# Patient Record
Sex: Female | Born: 1965 | Race: White | Hispanic: No | Marital: Married | Smoking: Never smoker
Health system: Southern US, Community
[De-identification: ages and names within clinical notes are randomized; demographics above are authoritative.]

## PROBLEM LIST (undated history)

## (undated) DIAGNOSIS — E079 Disorder of thyroid, unspecified: Secondary | ICD-10-CM

## (undated) DIAGNOSIS — F419 Anxiety disorder, unspecified: Secondary | ICD-10-CM

## (undated) DIAGNOSIS — N950 Postmenopausal bleeding: Secondary | ICD-10-CM

## (undated) DIAGNOSIS — T1491XA Suicide attempt, initial encounter: Secondary | ICD-10-CM

## (undated) DIAGNOSIS — R32 Unspecified urinary incontinence: Secondary | ICD-10-CM

## (undated) DIAGNOSIS — K219 Gastro-esophageal reflux disease without esophagitis: Secondary | ICD-10-CM

## (undated) DIAGNOSIS — Z87898 Personal history of other specified conditions: Secondary | ICD-10-CM

## (undated) DIAGNOSIS — E785 Hyperlipidemia, unspecified: Secondary | ICD-10-CM

## (undated) DIAGNOSIS — E039 Hypothyroidism, unspecified: Secondary | ICD-10-CM

## (undated) HISTORY — DX: Unspecified urinary incontinence: R32

## (undated) HISTORY — DX: Anxiety disorder, unspecified: F41.9

## (undated) HISTORY — DX: Hypothyroidism, unspecified: E03.9

## (undated) HISTORY — DX: Hyperlipidemia, unspecified: E78.5

## (undated) HISTORY — DX: Disorder of thyroid, unspecified: E07.9

---

## 1998-04-05 ENCOUNTER — Other Ambulatory Visit: Admission: RE | Admit: 1998-04-05 | Discharge: 1998-04-05 | Payer: Self-pay | Admitting: Obstetrics & Gynecology

## 1998-07-27 HISTORY — PX: OTHER SURGICAL HISTORY: SHX169

## 1998-11-01 ENCOUNTER — Inpatient Hospital Stay (HOSPITAL_COMMUNITY): Admission: AD | Admit: 1998-11-01 | Discharge: 1998-11-04 | Payer: Self-pay | Admitting: *Deleted

## 1999-03-20 ENCOUNTER — Ambulatory Visit (HOSPITAL_COMMUNITY): Admission: RE | Admit: 1999-03-20 | Discharge: 1999-03-20 | Payer: Self-pay | Admitting: Obstetrics and Gynecology

## 1999-07-28 HISTORY — PX: OTHER SURGICAL HISTORY: SHX169

## 2004-12-17 ENCOUNTER — Encounter: Admission: RE | Admit: 2004-12-17 | Discharge: 2004-12-17 | Payer: Self-pay | Admitting: Family Medicine

## 2006-04-06 IMAGING — US US ABDOMEN COMPLETE
1 series · 14 of 25 positions shown · non-contrast
Comparison: none

CLINICAL DATA: Right upper quadrant abdominal pain.

[Series 1: unknown · 14 of 67 slices shown]
[im 1/67]
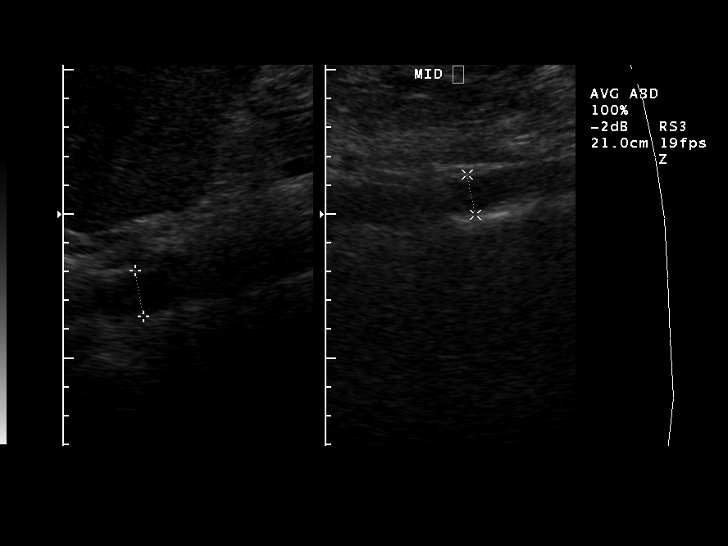
[im 6/67]
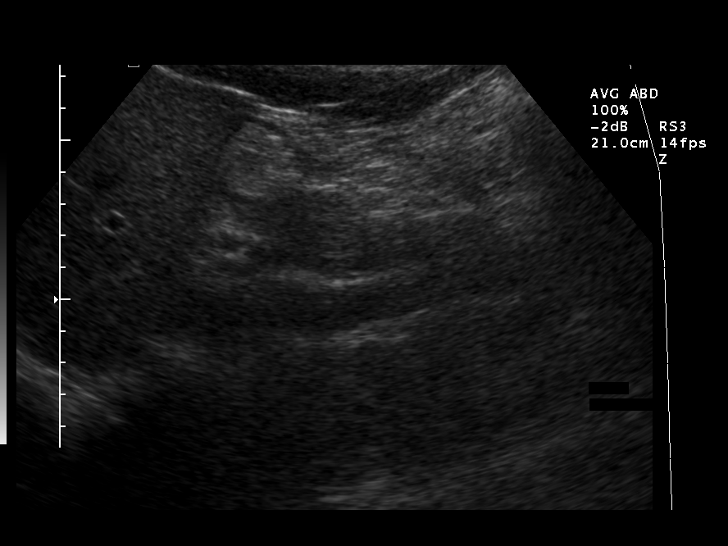
[im 12/67]
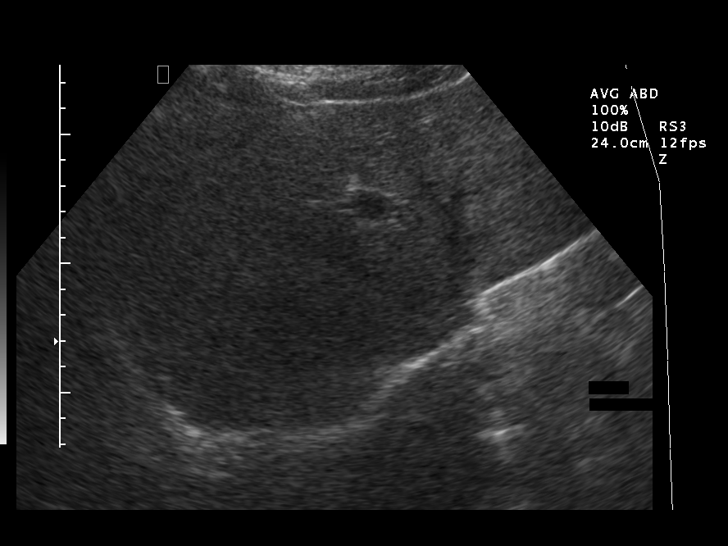
[im 17/67]
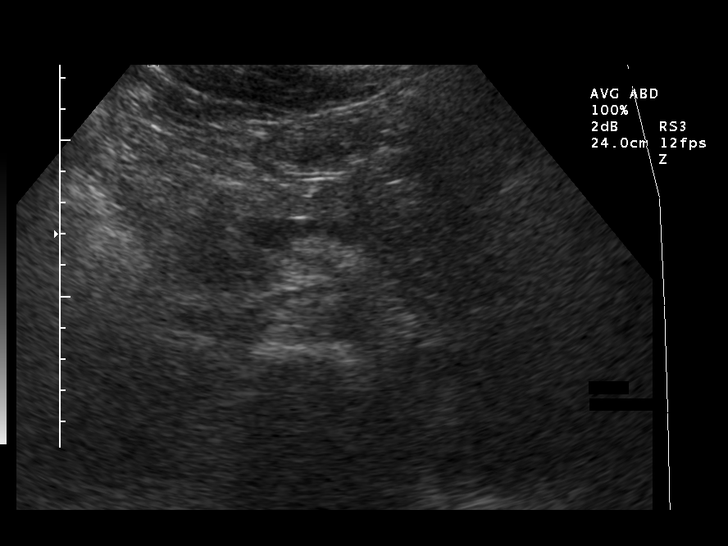
[im 23/67]
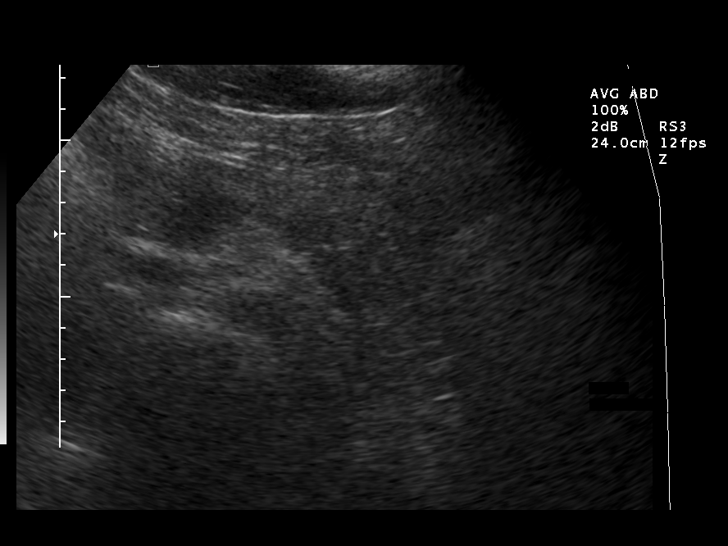
[im 25/67]
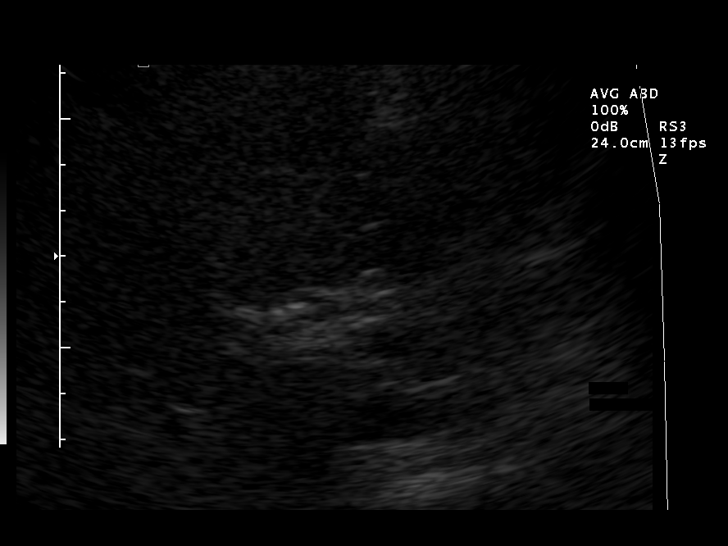
[im 31/67]
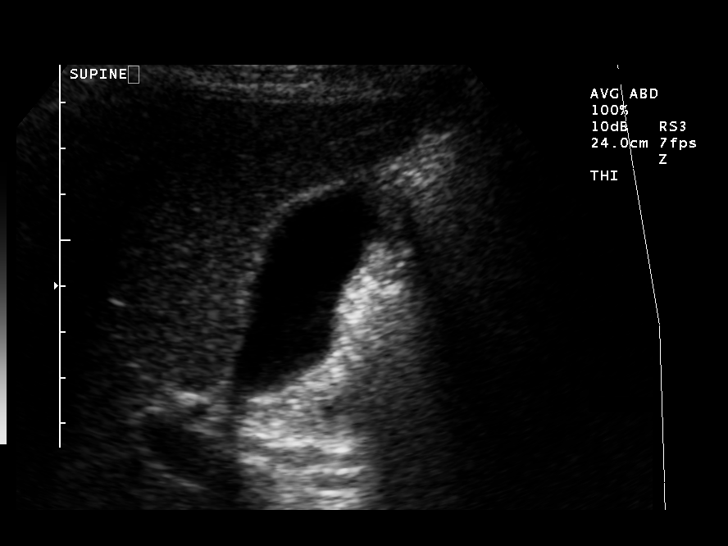
[im 36/67]
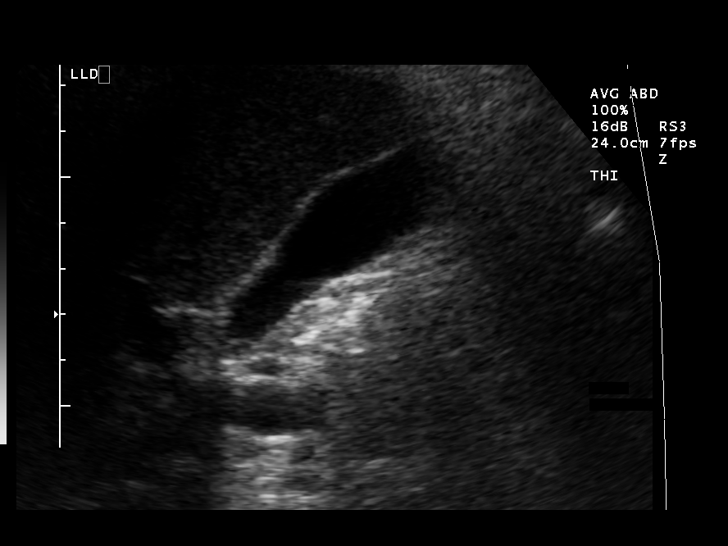
[im 42/67]
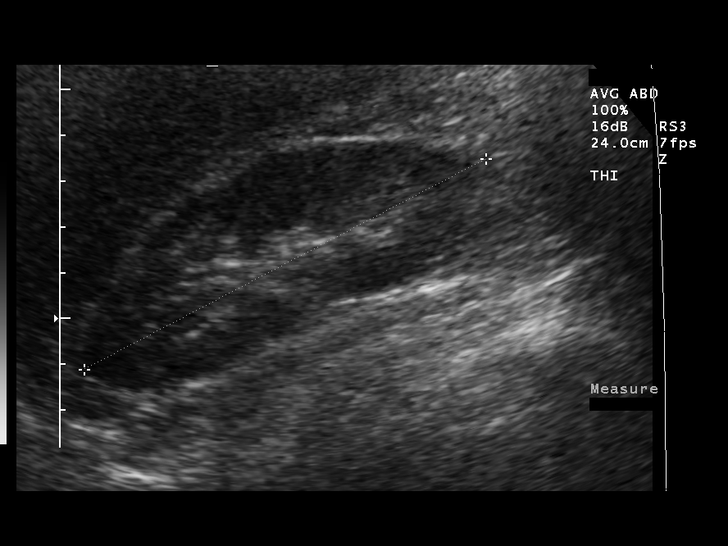
[im 45/67]
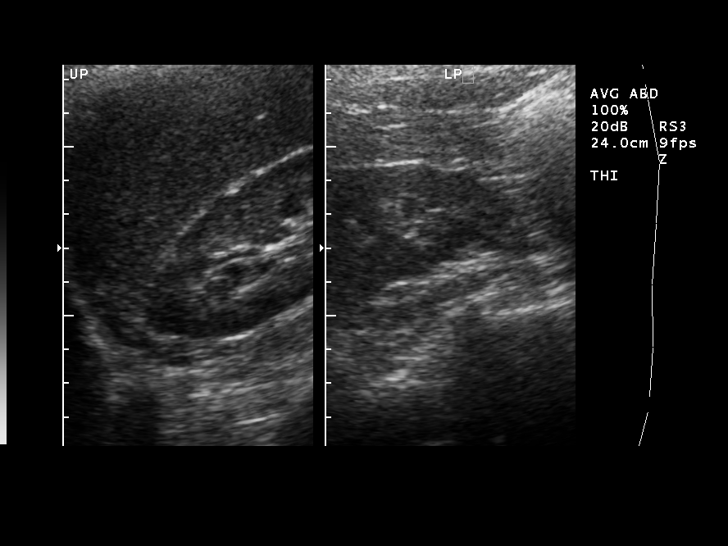
[im 50/67]
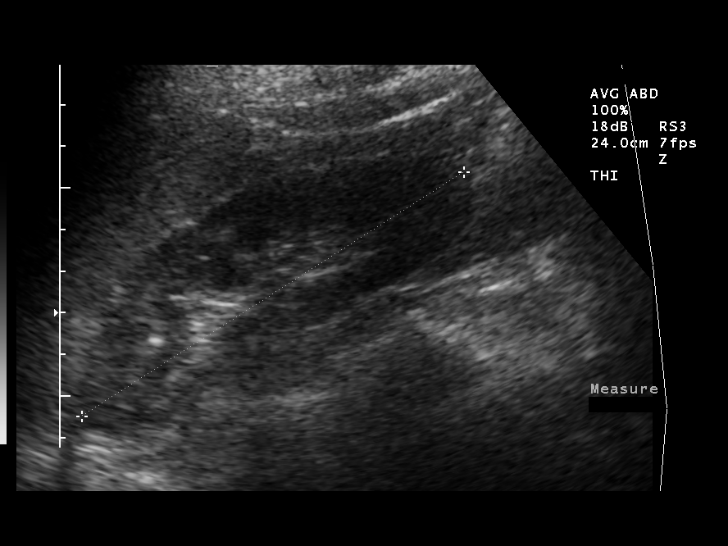
[im 56/67]
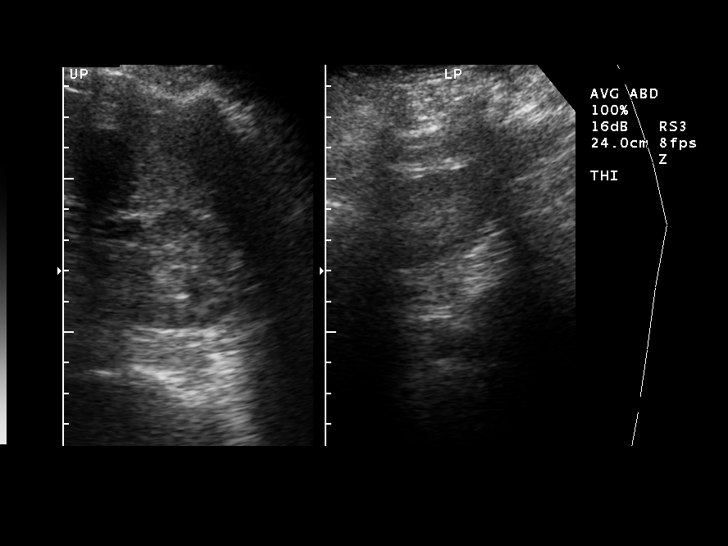
[im 61/67]
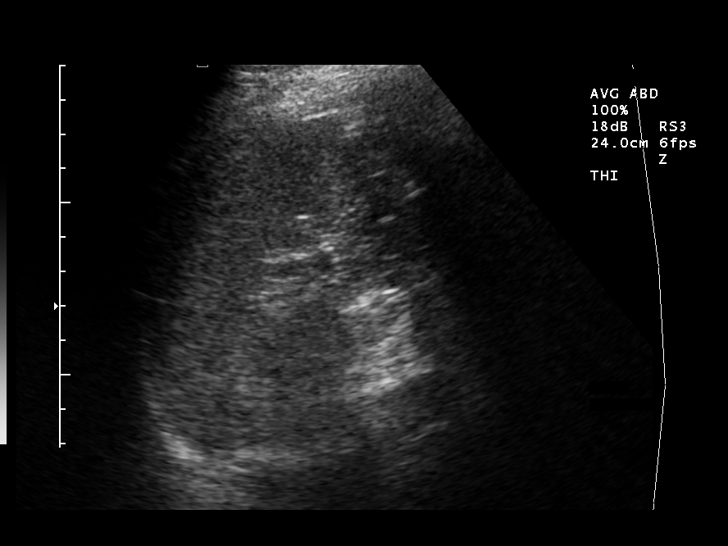
[im 67/67]
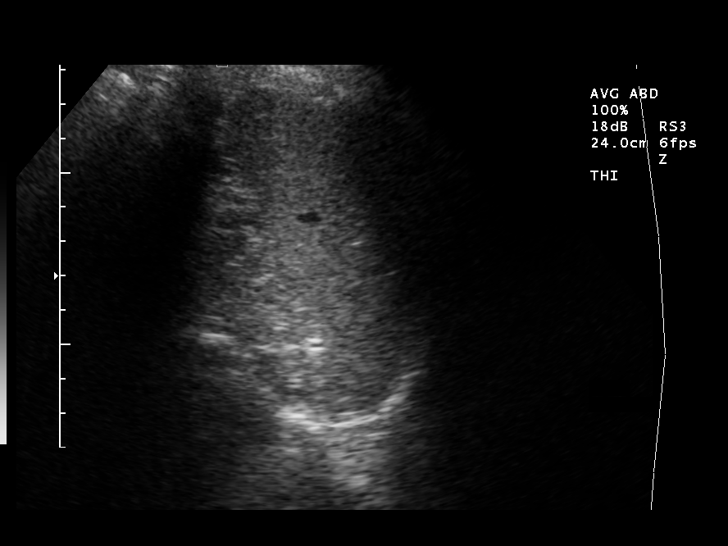

[14 of 25 positions shown; findings below may reference images not displayed]

COMPLETE ABDOMEN ULTRASOUND

The gallbladder, liver, spleen, kidneys, abdominal aorta and inferior vena cava
have normal appearances. Suboptimally visualized pancreas with no gross
pancreatic abnormality seen. No gallstones, biliary ductal dilatation or free
peritoneal fluid.  The common duct measures 3.9 mm in maximum diameter.

IMPRESSION

Suboptimally visualized pancreas. Otherwise, normal examination.

## 2007-06-02 ENCOUNTER — Other Ambulatory Visit: Admission: RE | Admit: 2007-06-02 | Discharge: 2007-06-02 | Payer: Self-pay | Admitting: Obstetrics & Gynecology

## 2013-05-02 ENCOUNTER — Other Ambulatory Visit: Payer: Self-pay | Admitting: Family Medicine

## 2013-05-02 DIAGNOSIS — Z1231 Encounter for screening mammogram for malignant neoplasm of breast: Secondary | ICD-10-CM

## 2013-05-09 ENCOUNTER — Ambulatory Visit: Payer: Self-pay

## 2013-06-07 ENCOUNTER — Other Ambulatory Visit: Payer: Self-pay | Admitting: Family Medicine

## 2013-06-07 ENCOUNTER — Ambulatory Visit
Admission: RE | Admit: 2013-06-07 | Discharge: 2013-06-07 | Disposition: A | Discharge status: Home / Self Care | Payer: Self-pay | Source: Ambulatory Visit | Attending: Family Medicine | Admitting: Family Medicine

## 2013-06-07 ENCOUNTER — Ambulatory Visit: Payer: Self-pay

## 2013-06-07 DIAGNOSIS — Z1231 Encounter for screening mammogram for malignant neoplasm of breast: Secondary | ICD-10-CM

## 2014-05-10 ENCOUNTER — Other Ambulatory Visit: Payer: Self-pay

## 2014-05-10 DIAGNOSIS — Z1239 Encounter for other screening for malignant neoplasm of breast: Secondary | ICD-10-CM

## 2014-06-08 ENCOUNTER — Encounter (INDEPENDENT_AMBULATORY_CARE_PROVIDER_SITE_OTHER): Payer: Self-pay

## 2014-06-08 ENCOUNTER — Ambulatory Visit: Payer: BC Managed Care – PPO

## 2014-06-08 ENCOUNTER — Ambulatory Visit
Admission: RE | Admit: 2014-06-08 | Discharge: 2014-06-08 | Disposition: A | Discharge status: Home / Self Care | Payer: 59 | Source: Ambulatory Visit

## 2014-06-08 DIAGNOSIS — Z1239 Encounter for other screening for malignant neoplasm of breast: Secondary | ICD-10-CM

## 2015-05-16 ENCOUNTER — Other Ambulatory Visit: Payer: Self-pay

## 2015-05-16 DIAGNOSIS — Z1231 Encounter for screening mammogram for malignant neoplasm of breast: Secondary | ICD-10-CM

## 2015-06-19 ENCOUNTER — Ambulatory Visit
Admission: RE | Admit: 2015-06-19 | Discharge: 2015-06-19 | Disposition: A | Discharge status: Home / Self Care | Payer: 59 | Source: Ambulatory Visit

## 2015-06-19 DIAGNOSIS — Z1231 Encounter for screening mammogram for malignant neoplasm of breast: Secondary | ICD-10-CM

## 2015-12-19 ENCOUNTER — Encounter: Payer: Self-pay | Admitting: Obstetrics and Gynecology

## 2015-12-19 ENCOUNTER — Ambulatory Visit (INDEPENDENT_AMBULATORY_CARE_PROVIDER_SITE_OTHER): Payer: BLUE CROSS/BLUE SHIELD | Admitting: Obstetrics and Gynecology

## 2015-12-19 VITALS — BP 122/82 | HR 84 | Resp 12 | Ht 64.5 in

## 2015-12-19 DIAGNOSIS — N750 Cyst of Bartholin's gland: Secondary | ICD-10-CM

## 2015-12-19 NOTE — Patient Instructions (Signed)
Bartholin Cyst or Abscess A Bartholin cyst is a fluid-filled sac that forms on a Bartholin gland. Bartholin glands are small glands that are located within the folds of skin (labia) along the sides of the lower opening of the vagina. These glands produce a fluid to moisten the outside of the vagina during sexual intercourse. A Bartholin cyst causes a bulge on the side of the vagina. A cyst that is not large or infected may not cause symptoms or problems. However, if the fluid within the cyst becomes infected, the cyst can turn into an abscess. An abscess may cause discomfort or pain. CAUSES A Bartholin cyst may develop when the duct of the gland becomes blocked. In many cases, the cause of this is not known. Various kinds of bacteria can cause the cyst to become infected and develop into an abscess. RISK FACTORS You may be at an increased risk of developing a Bartholin cyst or abscess if:  You are a woman of reproductive age.  You have a history of previous Bartholin cysts or abscesses.  You have diabetes.  You have a sexually transmitted disease (STD). SIGNS AND SYMPTOMS The severity of symptoms varies depending on the size of the cyst and whether it is infected. Symptoms may include:  A bulge or swelling near the lower opening of your vagina.  Discomfort or pain.  Redness.  Pain during sexual intercourse.  Pain when walking.  Fluid draining from the area. DIAGNOSIS Your health care provider may make a diagnosis based on your symptoms and a physical exam. He or she will look for swelling in your vaginal area. Blood tests may be done to check for infections. A sample of fluid from the cyst or abscess may also be taken to be tested in a lab. TREATMENT Small cysts that are not infected may not require any treatment. These often go away on their own. Yourhealth care provider will recommend hot baths and the use of warm compresses. These may also be part of the treatment for an abscess.  Treatment options for a large cyst or abscess may include:   Antibiotic medicine.  A surgical procedure to drain the abscess. One of the following procedures may be done:  Incision and drainage. An incision is made in the cyst or abscess so that the fluid drains out. A catheter may be placed inside the cyst so that it does not close and fill up with fluid again. The catheter will be removed after you have a follow-up visit with a specialist (gynecologist).  Marsupialization. The cyst or abscess is opened and kept open by stitching the edges of the skin to the walls of the cyst or abscess. This allows it to continue to drain and not fill up with fluid again. If you have cysts or abscesses that keep returning and have required incision and drainage multiple times, your health care provider may talk to you about surgery to remove the Bartholin gland. HOME CARE INSTRUCTIONS  Take medicines only as directed by your health care provider.  If you were prescribed an antibiotic medicine, finish it all even if you start to feel better.  Apply warm, wet compresses to the area or take warm, shallow baths that cover your pelvic region (sitz baths) several times a day or as directed by your health care provider.  Do not squeeze the cyst or apply heavy pressure to it.  Do not have sexual intercourse until the cyst has gone away.  If your cyst or abscess was   opened, a small piece of gauze or a drain may have been placed in the area to allow drainage. Do not remove the gauze or the drain until directed by your health care provider.  Wear feminine pads--not tampons--as needed for any drainage or bleeding.  Keep all follow-up visits as directed by your health care provider. This is important. PREVENTION Take these steps to help prevent a Bartholin cyst from returning:  Practice good hygiene.   Clean your vaginal area with mild soap and a soft cloth when you bathe.  Practice safe sex to prevent  STDs. SEEK MEDICAL CARE IF:  You have increased pain, swelling, or redness in the area of the cyst.  Puslike drainage is coming from the cyst.  You have a fever.   This information is not intended to replace advice given to you by your health care provider. Make sure you discuss any questions you have with your health care provider.   Document Released: 07/13/2005 Document Revised: 08/03/2014 Document Reviewed: 02/26/2014 Elsevier Interactive Patient Education 2016 [employer]  

## 2015-12-19 NOTE — Progress Notes (Signed)
Patient ID: Chelsea Gross, female   DOB: [birth year], 50 y.o.   MRN: [id number] 50 y.o. G1P1001 SingleCaucasianF here c/o a bartholin cyst. She has had a bartholin's cyst on the right drained x 2. The first time was in 2000 the second time in 2006. She noticed this cyst a couple of months ago, getting bigger. No baseline pain, just intermittent sharp pains.   Period Duration (Days): 2 days  Period Pattern: Regular Menstrual Flow: Heavy Menstrual Control: Thin pad, Tampon Dysmenorrhea: (!) Moderate Dysmenorrhea Symptoms: Headache  Patient's last menstrual period was 12/05/2015.          Sexually active: Yes.    The current method of family planning is none.    Exercising: No.  The patient does not participate in regular exercise at present. Smoker:  no  Health Maintenance: Pap:  06/2014 WNL per patient  History of abnormal Pap:  no MMG:  06-19-15 WNL Colonoscopy:  Never BMD:   Never TDaP:  2006 Gardasil: N/A   reports that she has never smoked. She has never used smokeless tobacco. She reports that she does not drink alcohol or use illicit drugs.  Past Medical History  Diagnosis Date  . Anxiety   . Thyroid disease   . Urinary incontinence   . Hyperlipemia     Past Surgical History  Procedure Laterality Date  . Labial repair  2000  . Chin and lip surgery  2001    Current Outpatient Prescriptions  Medication Sig Dispense Refill  . ALPRAZolam (XANAX) 0.25 MG tablet   1  . atorvastatin (LIPITOR) 40 MG tablet   3  . DULoxetine (CYMBALTA) 60 MG capsule   1  . levothyroxine (SYNTHROID, LEVOTHROID) 25 MCG tablet   1  . valACYclovir (VALTREX) 1000 MG tablet   0   No current facility-administered medications for this visit.    Family History  Problem Relation Age of Onset  . Heart attack Mother   . Esophageal cancer Mother   . Heart attack Father   . Hyperlipidemia Sister   . Hyperlipidemia Sister   . Hyperlipidemia Sister   . Hyperlipidemia Sister     Review of  Systems  Constitutional: Negative.   HENT: Negative.   Eyes: Negative.   Respiratory: Negative.   Cardiovascular: Negative.   Gastrointestinal: Negative.   Endocrine: Negative.   Genitourinary: Negative.        Bartholin cyst  Musculoskeletal: Negative.   Skin: Negative.   Allergic/Immunologic: Negative.   Neurological: Negative.   Psychiatric/Behavioral: Negative.     Exam:   BP 122/82 mmHg  Pulse 84  Resp 12  Ht 5' 4.5" (1.638 m)  Wt   LMP 12/05/2015  Weight change: @ Height:   Height: 5' 4.5" (163.8 cm)  Ht Readings from Last 3 Encounters:  12/19/15 5' 4.5" (1.638 m)    General appearance: alert, cooperative and appears stated age   Pelvic: External genitalia:  no lesions              Urethra:  normal appearing urethra with no masses, tenderness or lesions              Bartholins and Skenes: 1 cm, smooth, mobile right bartholin's cyst, not tender                   Chaperone was present for exam.  A:  50 year old menstruating female with recurrence of a right bartholin's cyst. She has been treated for this cyst 2  x in the past. The last x was 11 years ago. Not bothersome.   P:   I do not recommend word catheter placement at this time, the cyst is small and has already been treated x 2. We discussed marsupialization in the OR with biopsy of the cyst wall. I think the risk of malignancy is very low, particularly because she has had this 2 x in the past (starting 17 years ago). It is benign on exam. If this were her first time with a bartholin's I would be more concerned. She does not want marsupialization at this time.    Plan: return in 2 months for an annual exam and f/u exam, if the cyst is enlarging, would recommend biopsy and marsupialization

## 2015-12-25 ENCOUNTER — Telehealth: Payer: Self-pay | Admitting: Obstetrics and Gynecology

## 2015-12-25 NOTE — Telephone Encounter (Signed)
Patient canceled her 02/13/16 "f/u bartholin's cyst" appointment. Patient said "I no longer need this appointment".

## 2015-12-27 NOTE — Telephone Encounter (Signed)
Please call the patient and talk to her about f/u. She has a small right bartholin's cyst, she has had them treated before (many years ago). These cysts are less common at her age. If she were 19, I would be fine with her not following up, but because of her age the risks of pathology increases (still low). At the time of her visit she felt the cyst was getting bigger, if it continues to grow it would be important to biopsy the cyst. I would strongly encourage a f/u visit in 2-3 months.

## 2016-01-02 NOTE — Telephone Encounter (Signed)
Call to patient. Advised of omportance of follow-up as instructed by Dr Oscar LaJertson. Appointment scheduled for 01-30-16. Patient to call back if any changes noted before then.  Routing to provider for final review. Patient agreeable to disposition. Will close encounter.

## 2016-01-30 ENCOUNTER — Ambulatory Visit (INDEPENDENT_AMBULATORY_CARE_PROVIDER_SITE_OTHER): Payer: BLUE CROSS/BLUE SHIELD | Admitting: Obstetrics and Gynecology

## 2016-01-30 ENCOUNTER — Encounter: Payer: Self-pay | Admitting: Obstetrics and Gynecology

## 2016-01-30 VITALS — BP 114/80 | HR 78 | Resp 14 | Ht 64.5 in

## 2016-01-30 DIAGNOSIS — Z1211 Encounter for screening for malignant neoplasm of colon: Secondary | ICD-10-CM | POA: Diagnosis not present

## 2016-01-30 DIAGNOSIS — Z01419 Encounter for gynecological examination (general) (routine) without abnormal findings: Secondary | ICD-10-CM | POA: Diagnosis not present

## 2016-01-30 DIAGNOSIS — Z23 Encounter for immunization: Secondary | ICD-10-CM | POA: Diagnosis not present

## 2016-01-30 DIAGNOSIS — N750 Cyst of Bartholin's gland: Secondary | ICD-10-CM | POA: Diagnosis not present

## 2016-01-30 DIAGNOSIS — Z124 Encounter for screening for malignant neoplasm of cervix: Secondary | ICD-10-CM | POA: Diagnosis not present

## 2016-01-30 NOTE — Progress Notes (Addendum)
GYNECOLOGY  VISIT   HPI: 50 y.o.   Single  Caucasian  female   G1P1001 with Patient's last menstrual period was 01/11/2016.   here for recheck of bartholin cyst.     Menses q 3 weeks x 2.5 days. Saturates a regular tampon in 3-4 hours. No BTB. Slight cramps. Sexually active, same partner x 6 years, no contraception (for 6 years). No dyspareunia. No vaginal dryness. No vasomotor symptoms.  She states no change in her bartholin's cyst, not tender.   GYNECOLOGIC HISTORY: Patient's last menstrual period was 01/11/2016. 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         OB History    Gravida Para Term Preterm AB TAB SAB Ectopic Multiple Living   1 1 1       1          There are no active problems to display for this patient.   Past Medical History  Diagnosis Date  . Anxiety   . Thyroid disease   . Urinary incontinence   . Hyperlipemia   . Hypothyroid     Past Surgical History  Procedure Laterality Date  . Labial repair  2000  . Chin and lip surgery  2001    Current Outpatient Prescriptions  Medication Sig Dispense Refill  . ALPRAZolam (XANAX) 0.25 MG tablet   1  . atorvastatin (LIPITOR) 40 MG tablet   3  . DULoxetine (CYMBALTA) 60 MG capsule   1  . levothyroxine (SYNTHROID, LEVOTHROID) 25 MCG tablet   1  . valACYclovir (VALTREX) 1000 MG tablet   0   No current facility-administered medications for this visit.     ALLERGIES: Sulfa antibiotics and Pantoprazole  Family History  Problem Relation Age of Onset  . Heart attack Mother   . Esophageal cancer Mother   . Heart attack Father   . Hyperlipidemia Sister   . Hyperlipidemia Sister   . Hyperlipidemia Sister   . Hyperlipidemia Sister     Social History   Social History  .  Marital Status: Single    Spouse Name: N/A  . Number of Children: N/A  . Years of Education: N/A   Occupational History  . Not on file.   Social History Main Topics  . Smoking status: Never Smoker   . Smokeless tobacco: Never Used  . Alcohol Use: No  . Drug Use: No  . Sexual Activity:    Partners: Male   Other Topics Concern  . Not on file   Social History Narrative    Review of Systems  Constitutional: Negative.   HENT: Negative.   Eyes: Negative.   Respiratory: Negative.   Cardiovascular: Negative.   Gastrointestinal: Negative.   Genitourinary:       Bartholin Cyst  Musculoskeletal: Negative.   Skin: Negative.   Neurological: Negative.   Endo/Heme/Allergies: Negative.   Psychiatric/Behavioral: Negative.     PHYSICAL EXAMINATION:    BP 114/80 mmHg  Pulse 78  Resp 14  Ht 5' 4.5" (1.638 m)  Wt   LMP 01/11/2016    General appearance: alert, cooperative and appears stated age Neck: no adenopathy, supple, symmetrical, trachea midline and thyroid normal to inspection and palpation Heart: regular rate and rhythm Lungs: CTAB Abdomen: soft, non-tender; bowel sounds normal; no masses,  no  organomegaly Lungs: CTAB Extremities: normal, atraumatic, no cyanosis Skin: normal color, texture and turgor, no rashes or lesions Lymph: normal cervical supraclavicular and inguinal nodes Neurologic: grossly normal   Pelvic: External genitalia:  no lesions              Urethra:  normal appearing urethra with no masses, tenderness or lesions              Bartholins and Skenes: Stable 1 cm right bartholin's cyst. Smooth, not tender. No change.                Vagina: normal appearing vagina with normal color and discharge, no lesions              Cervix: no lesions              Bimanual Exam:  Uterus:  normal size, contour, position, consistency, mobility, non-tender and retroverted              Adnexa: no mass, fullness, tenderness              Rectovaginal: Yes.  .   Confirms.              Anus:  normal sphincter tone, no lesions  Chaperone was present for exam.  ASSESSMENT Well woman exam Bartholin's cyst, stable, h/o word catheter placement x 2 in the past    PLAN Pap with hpv Labs with primary TDAP today Discussed breast self awareness Discussed calcium and vit D Mammogram in 11/17 Colonoscopy due, she is hesitant, will give her IFOBT     An After Visit Summary was printed and given to the patient.   Addendum: the patient is aware to call if she notices any increased size of the bartholin's cyst

## 2016-02-03 LAB — IPS PAP TEST WITH HPV

## 2016-02-13 ENCOUNTER — Ambulatory Visit: Payer: BLUE CROSS/BLUE SHIELD | Admitting: Obstetrics and Gynecology

## 2016-04-16 DIAGNOSIS — F329 Major depressive disorder, single episode, unspecified: Secondary | ICD-10-CM | POA: Insufficient documentation

## 2016-04-16 DIAGNOSIS — E785 Hyperlipidemia, unspecified: Secondary | ICD-10-CM | POA: Insufficient documentation

## 2016-04-16 DIAGNOSIS — E039 Hypothyroidism, unspecified: Secondary | ICD-10-CM | POA: Insufficient documentation

## 2016-04-16 DIAGNOSIS — F32A Depression, unspecified: Secondary | ICD-10-CM | POA: Insufficient documentation

## 2016-04-16 DIAGNOSIS — F419 Anxiety disorder, unspecified: Secondary | ICD-10-CM | POA: Insufficient documentation

## 2016-04-16 HISTORY — DX: Hyperlipidemia, unspecified: E78.5

## 2016-04-16 HISTORY — DX: Major depressive disorder, single episode, unspecified: F32.9

## 2016-04-16 HISTORY — DX: Hypothyroidism, unspecified: E03.9

## 2016-04-16 HISTORY — DX: Depression, unspecified: F32.A

## 2016-06-30 ENCOUNTER — Other Ambulatory Visit: Payer: Self-pay | Admitting: Family Medicine

## 2016-06-30 DIAGNOSIS — Z1231 Encounter for screening mammogram for malignant neoplasm of breast: Secondary | ICD-10-CM

## 2016-07-27 HISTORY — PX: OTHER SURGICAL HISTORY: SHX169

## 2016-08-06 ENCOUNTER — Ambulatory Visit
Admission: RE | Admit: 2016-08-06 | Discharge: 2016-08-06 | Disposition: A | Discharge status: Home / Self Care | Payer: BLUE CROSS/BLUE SHIELD | Source: Ambulatory Visit | Attending: Family Medicine | Admitting: Family Medicine

## 2016-08-06 DIAGNOSIS — Z1231 Encounter for screening mammogram for malignant neoplasm of breast: Secondary | ICD-10-CM

## 2017-02-10 NOTE — Progress Notes (Signed)
51 y.o. G1P1001 SingleCaucasianF here for annual exam.   She has a right bartholin's cyst, not bothersome, not changing. She has a new skin tag on the right vulva, she doesn't like it and wants it removed.  Menses are mainly every month, one skipped cycle and this current cycle is 2 weeks late.  Sexually active, same partner x 1 year. No dyspareunia. No vasomotor symptoms, no vaginal dryness.  Work and family have been stressful. Some c/o palpitations, no SOB, no chest pain.  Period Duration (Days): 3 Period Pattern: (!) Irregular Menstrual Flow: Moderate Menstrual Control: Thin pad, Tampon Menstrual Control Change Freq (Hours): every 3-4  Dysmenorrhea: (!) Mild Dysmenorrhea Symptoms: Cramping, Headache  Patient's last menstrual period was 02/11/2017.          Sexually active: Yes.    The current method of family planning is none.    Exercising: No.  The patient does not participate in regular exercise at present. Smoker:  no  Health Maintenance: Pap:  01/30/16 Neg. HR HPV:neg  History of abnormal Pap:  no MMG:  08/06/16 BIRADS1:neg  Colonoscopy:  Never BMD:   Never TDaP:  2017   reports that she has never smoked. She has never used smokeless tobacco. She reports that she does not drink alcohol or use drugs. 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 Daughter is 22, going to college in the fall.   Past Medical History:  Diagnosis Date  . Anxiety   . Hyperlipemia   . Hypothyroid   . Thyroid disease   . Urinary incontinence     Past Surgical History:  Procedure Laterality Date  . chin and lip surgery  2001  . labial repair  2000    Current Outpatient Prescriptions  Medication Sig Dispense Refill  . ALPRAZolam (XANAX) 0.25 MG tablet Take 0.25 mg by mouth at bedtime as needed.   1  . atorvastatin (LIPITOR) 40 MG tablet Take 40 mg by mouth daily at 6 PM.   3  . DULoxetine (CYMBALTA) 60 MG capsule Take 60 mg by mouth daily.   1  . levothyroxine (SYNTHROID, LEVOTHROID) 25 MCG  tablet Take 25 mcg by mouth daily before breakfast.   1  . metroNIDAZOLE (METROGEL) 1 % gel Apply topically daily. For face    . omeprazole (PRILOSEC) 20 MG capsule daily as needed.  6  . triamcinolone lotion (KENALOG) 0.1 % ataa bid    . valACYclovir (VALTREX) 1000 MG tablet Take 1,000 mg by mouth as needed.   0   No current facility-administered medications for this visit.     Family History  Problem Relation Age of Onset  . Heart attack Mother   . Esophageal cancer Mother   . Heart attack Father   . Hyperlipidemia Sister   . Hyperlipidemia Sister   . Hyperlipidemia Sister   . Hyperlipidemia Sister     Review of Systems  Constitutional: Negative.   HENT: Negative.   Eyes: Negative.   Respiratory: Negative.   Cardiovascular: Negative.   Gastrointestinal: Negative.   Endocrine: Negative.   Genitourinary: Negative.   Musculoskeletal: Negative.   Skin: Negative.   Allergic/Immunologic: Negative.   Neurological: Negative.   Hematological: Negative.   Psychiatric/Behavioral: Negative.     Exam:   BP 110/86 (BP Location: Left Arm, Patient Position: Sitting, Cuff Size: Large)   Pulse 90   Resp 16   Ht 5' 4.25" (1.632 m)   LMP 02/11/2017   Weight change: @WEIGHTCHANGE @ Height:  Height: 5' 4.25" (163.2 cm)  Ht Readings from Last 3 Encounters:  02/11/17 5' 4.25" (1.632 m)  01/30/16 5' 4.5" (1.638 m)  12/19/15 5' 4.5" (1.638 m)    General appearance: alert, cooperative and appears stated age Head: Normocephalic, without obvious abnormality, atraumatic Neck: no adenopathy, supple, symmetrical, trachea midline and thyroid normal to inspection and palpation Lungs: clear to auscultation bilaterally Cardiovascular: regular rate and rhythm Breasts: normal appearance, no masses or tenderness Abdomen: soft, non-tender; bowel sounds normal; no masses,  no organomegaly Extremities: extremities normal, atraumatic, no cyanosis or edema Skin: Skin color, texture, turgor normal. No  rashes or lesions Lymph nodes: Cervical, supraclavicular, and axillary nodes normal. No abnormal inguinal nodes palpated Neurologic: Grossly normal   Pelvic: External genitalia:  4-5 mm skin tag at the end of the right labia minora              Urethra:  normal appearing urethra with no masses, tenderness or lesions              Bartholins and Skenes: stable, non tender, 1 cm right bartholin's cyst                 Vagina: normal appearing vagina with normal color and discharge, no lesions              Cervix: no lesions               Bimanual Exam:  Uterus:  normal size, contour, position, consistency, mobility, non-tender and retroverted              Adnexa: no mass, fullness, tenderness               Rectovaginal: Confirms               Anus:  normal sphincter tone, no lesions  Chaperone was present for exam.  A:  Well Woman with normal exam  Right bartholin's cyst, small and stable. Not symptomatic  Skin tag on the right labia minora, desires removal    P:   No pap today  Mammogram UTD  Labs with primary  Declines colonoscopy, IFOB testing given (discussed cologard)  Discussed breast self exam   Discussed calcium and vit D intake  Return for skin tag removal

## 2017-02-11 ENCOUNTER — Encounter: Payer: Self-pay | Admitting: Obstetrics and Gynecology

## 2017-02-11 ENCOUNTER — Ambulatory Visit (INDEPENDENT_AMBULATORY_CARE_PROVIDER_SITE_OTHER): Payer: BLUE CROSS/BLUE SHIELD | Admitting: Obstetrics and Gynecology

## 2017-02-11 VITALS — BP 110/86 | HR 90 | Resp 16 | Ht 64.25 in

## 2017-02-11 DIAGNOSIS — Z01419 Encounter for gynecological examination (general) (routine) without abnormal findings: Secondary | ICD-10-CM

## 2017-02-11 DIAGNOSIS — Z1211 Encounter for screening for malignant neoplasm of colon: Secondary | ICD-10-CM

## 2017-02-11 DIAGNOSIS — N9089 Other specified noninflammatory disorders of vulva and perineum: Secondary | ICD-10-CM

## 2017-02-11 DIAGNOSIS — N750 Cyst of Bartholin's gland: Secondary | ICD-10-CM

## 2017-02-11 NOTE — Patient Instructions (Signed)

## 2017-02-24 LAB — FECAL OCCULT BLOOD, IMMUNOCHEMICAL: IFOBT: POSITIVE

## 2017-02-24 NOTE — Addendum Note (Signed)
Addended by: Loreta AveMORALES, Elie Gragert C on: 02/24/2017 09:52 AM   Modules accepted: Orders

## 2017-02-25 ENCOUNTER — Telehealth: Payer: Self-pay | Admitting: *Deleted

## 2017-02-25 DIAGNOSIS — R195 Other fecal abnormalities: Secondary | ICD-10-CM

## 2017-02-25 NOTE — Telephone Encounter (Signed)
-----   Message from Romualdo BolkJill Evelyn Jertson, MD sent at 02/24/2017  5:01 PM EDT ----- Please tell the patient that her IFOB was + and set her up for a colonoscopy.

## 2017-02-25 NOTE — Telephone Encounter (Signed)
Left message to call Slevin Gunby at [phone].  

## 2017-03-02 NOTE — Telephone Encounter (Signed)
Spoke with Misty StanleyLisa at Dr. Kenna GilbertMann's office. Patient scheduled for 03/04/17 at 3pm with Dr. Loreta AveMann. Was advised to fax OV notes, labs, copy of referral and insurance information to [phone].   Requested information faxed to Dr. Loreta AveMann at [phone].

## 2017-03-02 NOTE — Telephone Encounter (Signed)
Spoke with patient, advised as seen below per Dr. Oscar LaJertson. Patient accepts referral to GI- Dr. Loreta AveMann. Advised patient referral placed, will call to schedule and return call with appointment details. Patient verbalizes understanding and is agreeable.

## 2017-03-02 NOTE — Telephone Encounter (Signed)
Spoke with patient, advised of appointment details as seen below, contact information provided. Patient verbalizes understanding and is agreeable.   Routing to provider for final review. Patient is agreeable to disposition. Will close encounter.

## 2017-03-04 ENCOUNTER — Telehealth: Payer: Self-pay | Admitting: Obstetrics and Gynecology

## 2017-03-04 ENCOUNTER — Other Ambulatory Visit: Payer: Self-pay | Admitting: Obstetrics and Gynecology

## 2017-03-04 NOTE — Telephone Encounter (Signed)
Opened phone message in error. °

## 2017-03-10 ENCOUNTER — Ambulatory Visit: Payer: BLUE CROSS/BLUE SHIELD | Admitting: Obstetrics and Gynecology

## 2017-03-11 ENCOUNTER — Ambulatory Visit: Payer: BLUE CROSS/BLUE SHIELD | Admitting: Obstetrics and Gynecology

## 2017-05-07 LAB — HM COLONOSCOPY

## 2017-08-02 ENCOUNTER — Other Ambulatory Visit: Payer: Self-pay | Admitting: Family Medicine

## 2017-08-02 DIAGNOSIS — Z1231 Encounter for screening mammogram for malignant neoplasm of breast: Secondary | ICD-10-CM

## 2017-08-19 ENCOUNTER — Ambulatory Visit: Payer: BLUE CROSS/BLUE SHIELD

## 2017-09-02 ENCOUNTER — Ambulatory Visit: Payer: BLUE CROSS/BLUE SHIELD

## 2017-09-15 ENCOUNTER — Telehealth: Payer: Self-pay | Admitting: Obstetrics and Gynecology

## 2017-09-15 NOTE — Telephone Encounter (Signed)
Spoke with patient. No menses in 4 months, spotting on 2/19. Patient states she is possibly menopausal, recent increase in mood swings.   Reports intermittent external vaginal burning/soreness started "2-4 months ago". Increased frequency, varies from small amount of urine to normal amounts.   Denies fever/chills, lower back pain, vaginal odor, or blood in urine.   Has taken AZO for the past 2 days with some relief. Instructed not to use Azo for more than 2 days, recommended OV for further evaluation. OV scheduled for 2/21 at 4pm with Dr. Oscar LaJertson. Advised patient Dr. Oscar LaJertson will review, I will return call with any additional recommendations, patient is agreeable.   Routing to provider for final review. Patient is agreeable to disposition. Will close encounter.

## 2017-09-15 NOTE — Telephone Encounter (Signed)
Patient having menopausal issues and would like to discuss medication.

## 2017-09-16 ENCOUNTER — Encounter: Payer: Self-pay | Admitting: Obstetrics and Gynecology

## 2017-09-16 ENCOUNTER — Other Ambulatory Visit: Payer: Self-pay

## 2017-09-16 ENCOUNTER — Ambulatory Visit: Payer: BLUE CROSS/BLUE SHIELD | Admitting: Obstetrics and Gynecology

## 2017-09-16 VITALS — BP 138/86 | HR 84 | Resp 16

## 2017-09-16 DIAGNOSIS — N951 Menopausal and female climacteric states: Secondary | ICD-10-CM

## 2017-09-16 DIAGNOSIS — R300 Dysuria: Secondary | ICD-10-CM | POA: Diagnosis not present

## 2017-09-16 DIAGNOSIS — N762 Acute vulvitis: Secondary | ICD-10-CM

## 2017-09-16 DIAGNOSIS — N912 Amenorrhea, unspecified: Secondary | ICD-10-CM | POA: Diagnosis not present

## 2017-09-16 DIAGNOSIS — R350 Frequency of micturition: Secondary | ICD-10-CM | POA: Diagnosis not present

## 2017-09-16 LAB — POCT URINE PREGNANCY: Preg Test, Ur: NEGATIVE

## 2017-09-16 MED ORDER — NITROFURANTOIN MONOHYD MACRO 100 MG PO CAPS: 100.0000 mg | ORAL_CAPSULE | Freq: Two times a day (BID) | ORAL | 0 refills | Status: DC

## 2017-09-16 MED ORDER — PHENAZOPYRIDINE HCL 200 MG PO TABS: 200.0000 mg | ORAL_TABLET | Freq: Three times a day (TID) | ORAL | 0 refills | Status: DC | PRN

## 2017-09-16 MED ORDER — MEDROXYPROGESTERONE ACETATE 5 MG PO TABS: ORAL_TABLET | ORAL | 1 refills | Status: DC

## 2017-09-16 MED ORDER — FLUCONAZOLE 150 MG PO TABS: 150.0000 mg | ORAL_TABLET | Freq: Once | ORAL | 0 refills | Status: AC

## 2017-09-16 NOTE — Progress Notes (Signed)
GYNECOLOGY  VISIT   HPI: 52 y.o.   Married  Caucasian  female   G1P1001 with Patient's last menstrual period was 03/27/2017 (approximate).   here c/o dysuria, frequency and RLQ pain    Her symptoms started 3 days ago, frequency, urgency and pain. Also has pain after voiding, feels like she has to void right after she voids, but doesn't. Azo has helped some.  She also c/o an "ovulation" type cramp in the RLQ started yesterday, intermittent. Mild discomfort. Spotting yesterday. More emotional in the last few days.  LMP 9/18, prior to that she was cycling monthly, occasionally a little off. Then last cycle around 9-10/18. She has felt premenstrual, but no bleeding.  Sexually active with her husband, together x 1.5 years, haven't used contraception.  She also has some vaginal burning and irritation. No vaginal dryness. No increase in vaginal d/c, no itching.  These symptoms of burning and urgency have come intermittently in the last few months. Only last a few days typically.  No fever, no flank pain.   No hot flashes, night sweats or vaginal dryness  GYNECOLOGIC HISTORY: Patient's last menstrual period was 03/27/2017 (approximate). Contraception:none  Menopausal hormone therapy: none         OB History    Gravida Para Term Preterm AB Living   1 1 1     1    SAB TAB Ectopic Multiple Live Births           1         There are no active problems to display for this patient.   Past Medical History:  Diagnosis Date  . Anxiety   . Hyperlipemia   . Hypothyroid   . Thyroid disease   . Urinary incontinence     Past Surgical History:  Procedure Laterality Date  . chin and lip surgery  2001  . labial repair  2000    Current Outpatient Medications  Medication Sig Dispense Refill  . ALPRAZolam (XANAX) 0.25 MG tablet Take 0.25 mg by mouth at bedtime as needed.   1  . atorvastatin (LIPITOR) 40 MG tablet Take 40 mg by mouth daily at 6 PM.   3  . DULoxetine (CYMBALTA) 60 MG capsule Take  60 mg by mouth daily.   1  . levothyroxine (SYNTHROID, LEVOTHROID) 25 MCG tablet Take 25 mcg by mouth daily before breakfast.   1  . metroNIDAZOLE (METROGEL) 1 % gel Apply topically daily. For face    . omeprazole (PRILOSEC) 20 MG capsule daily as needed.  6  . triamcinolone lotion (KENALOG) 0.1 % ataa bid    . valACYclovir (VALTREX) 1000 MG tablet Take 1,000 mg by mouth as needed.   0   No current facility-administered medications for this visit.      ALLERGIES: Sulfa antibiotics and Pantoprazole  Family History  Problem Relation Age of Onset  . Heart attack Mother   . Esophageal cancer Mother   . Heart attack Father   . Hyperlipidemia Sister   . Hyperlipidemia Sister   . Hyperlipidemia Sister   . Hyperlipidemia Sister     Social History   Socioeconomic History  . Marital status: Married    Spouse name: Not on file  . Number of children: Not on file  . Years of education: Not on file  . Highest education level: Not on file  Social Needs  . Financial resource strain: Not on file  . Food insecurity - worry: Not on file  . Food insecurity -  inability: Not on file  . Transportation needs - medical: Not on file  . Transportation needs - non-medical: Not on file  Occupational History  . Not on file  Tobacco Use  . Smoking status: Never Smoker  . Smokeless tobacco: Never Used  Substance and Sexual Activity  . Alcohol use: No    Alcohol/week: 0.0 oz  . Drug use: No  . Sexual activity: Yes    Partners: Male    Birth control/protection: None  Other Topics Concern  . Not on file  Social History Narrative  . Not on file    Review of Systems  Constitutional: Negative.   HENT: Negative.   Eyes: Negative.   Cardiovascular: Negative.   Gastrointestinal: Negative.   Genitourinary: Positive for dysuria and frequency.       Vaginal burning  RLQ pain   Musculoskeletal: Negative.   Skin: Negative.   Neurological: Negative.   Endo/Heme/Allergies: Negative.    Psychiatric/Behavioral: Negative.     PHYSICAL EXAMINATION:    BP 138/86 (BP Location: Left Arm, Patient Position: Sitting, Cuff Size: Normal)   Pulse 84   Resp 16   LMP 03/27/2017 (Approximate)     General appearance: alert, cooperative and appears stated age Neck: supple, no thyromegaly Abdomen: soft, mildly tender in the SP region; non distended, no masses,  no organomegaly  Pelvic: External genitalia:  no lesions              Urethra:  normal appearing urethra with no masses, tenderness or lesions              Bartholins and Skenes: normal                 Vagina: normal appearing vagina with normal color and discharge, no lesions              Cervix: no cervical motion tenderness and no lesions              Bimanual Exam:  Uterus:  normal size, contour, position, consistency, mobility, non-tender and retroverted              Adnexa: no mass, fullness, tenderness              Bladder: mildly tender  Chaperone was present for exam.  ASSESSMENT Dysuria, frequency, urgency, c/w UTI Vulvitis, normal exam Amenorrhea Perimenopause    PLAN Urine for ua, c&s Affirm Treat with macrobid and pyridium (patient requests a diflucan) UPT negative Check TSH and FSH Will treat with cyclic provera Use vaseline externally as needed   An After Visit Summary was printed and given to the patient.

## 2017-09-16 NOTE — Patient Instructions (Signed)
Menopause Menopause is the normal time of life when menstrual periods stop completely. Menopause is complete when you have missed 12 consecutive menstrual periods. It usually occurs between the ages of 48 years and 55 years. Very rarely does a woman develop menopause before the age of 40 years. At menopause, your ovaries stop producing the female hormones estrogen and progesterone. This can cause undesirable symptoms and also affect your health. Sometimes the symptoms may occur 4-5 years before the menopause begins. There is no relationship between menopause and:  Oral contraceptives.  Number of children you had.  Race.  The age your menstrual periods started (menarche).  Heavy smokers and very thin women may develop menopause earlier in life. What are the causes?  The ovaries stop producing the female hormones estrogen and progesterone. Other causes include:  Surgery to remove both ovaries.  The ovaries stop functioning for no known reason.  Tumors of the pituitary gland in the brain.  Medical disease that affects the ovaries and hormone production.  Radiation treatment to the abdomen or pelvis.  Chemotherapy that affects the ovaries.  What are the signs or symptoms?  Hot flashes.  Night sweats.  Decrease in sex drive.  Vaginal dryness and thinning of the vagina causing painful intercourse.  Dryness of the skin and developing wrinkles.  Headaches.  Tiredness.  Irritability.  Memory problems.  Weight gain.  Bladder infections.  Hair growth of the face and chest.  Infertility. More serious symptoms include:  Loss of bone (osteoporosis) causing breaks (fractures).  Depression.  Hardening and narrowing of the arteries (atherosclerosis) causing heart attacks and strokes.  How is this diagnosed?  When the menstrual periods have stopped for 12 straight months.  Physical exam.  Hormone studies of the blood. How is this treated? There are many treatment  choices and nearly as many questions about them. The decisions to treat or not to treat menopausal changes is an individual choice made with your health care provider. Your health care provider can discuss the treatments with you. Together, you can decide which treatment will work best for you. Your treatment choices may include:  Hormone therapy (estrogen and progesterone).  Non-hormonal medicines.  Treating the individual symptoms with medicine (for example antidepressants for depression).  Herbal medicines that may help specific symptoms.  Counseling by a psychiatrist or psychologist.  Group therapy.  Lifestyle changes including: ? Eating healthy. ? Regular exercise. ? Limiting caffeine and alcohol. ? Stress management and meditation.  No treatment.  Follow these instructions at home:  Take the medicine your health care provider gives you as directed.  Get plenty of sleep and rest.  Exercise regularly.  Eat a diet that contains calcium (good for the bones) and soy products (acts like estrogen hormone).  Avoid alcoholic beverages.  Do not smoke.  If you have hot flashes, dress in layers.  Take supplements, calcium, and vitamin D to strengthen bones.  You can use over-the-counter lubricants or moisturizers for vaginal dryness.  Group therapy is sometimes very helpful.  Acupuncture may be helpful in some cases. Contact a health care provider if:  You are not sure you are in menopause.  You are having menopausal symptoms and need advice and treatment.  You are still having menstrual periods after age 55 years.  You have pain with intercourse.  Menopause is complete (no menstrual period for 12 months) and you develop vaginal bleeding.  You need a referral to a specialist (gynecologist, psychiatrist, or psychologist) for treatment. Get help right   away if:  You have severe depression.  You have excessive vaginal bleeding.  You fell and think you have a  broken bone.  You have pain when you urinate.  You develop leg or chest pain.  You have a fast pounding heart beat (palpitations).  You have severe headaches.  You develop vision problems.  You feel a lump in your breast.  You have abdominal pain or severe indigestion. This information is not intended to replace advice given to you by your health care provider. Make sure you discuss any questions you have with your health care provider. Document Released: 10/03/2003 Document Revised: 12/19/2015 Document Reviewed: 02/09/2013 Elsevier Interactive Patient Education  2017 [employer] Urinary Tract Infection, Adult A urinary tract infection (UTI) is an infection of any part of the urinary tract. The urinary tract includes the:  Kidneys.  Ureters.  Bladder.  Urethra.  These organs make, store, and get rid of pee (urine) in the body. Follow these instructions at home:  Take over-the-counter and prescription medicines only as told by your doctor.  If you were prescribed an antibiotic medicine, take it as told by your doctor. Do not stop taking the antibiotic even if you start to feel better.  Avoid the following drinks: ? Alcohol. ? Caffeine. ? Tea. ? Carbonated drinks.  Drink enough fluid to keep your pee clear or pale yellow.  Keep all follow-up visits as told by your doctor. This is important.  Make sure to: ? Empty your bladder often and completely. Do not to hold pee for long periods of time. ? Empty your bladder before and after sex. ? Wipe from front to back after a bowel movement if you are female. Use each tissue one time when you wipe. Contact a doctor if:  You have back pain.  You have a fever.  You feel sick to your stomach (nauseous).  You throw up (vomit).  Your symptoms do not get better after 3 days.  Your symptoms go away and then come back. Get help right away if:  You have very bad back pain.  You have very bad lower belly (abdominal)  pain.  You are throwing up and cannot keep down any medicines or water. This information is not intended to replace advice given to you by your health care provider. Make sure you discuss any questions you have with your health care provider. Document Released: 12/30/2007 Document Revised: 12/19/2015 Document Reviewed: 06/03/2015 Elsevier Interactive Patient Education  [employer]2018 [employer] Inc.

## 2017-09-17 LAB — URINALYSIS, MICROSCOPIC ONLY
Casts: NONE SEEN /lpf
WBC, UA: NONE SEEN /hpf (ref 0–?)

## 2017-09-17 LAB — VAGINITIS/VAGINOSIS, DNA PROBE
Candida Species: NEGATIVE
Gardnerella vaginalis: NEGATIVE
Trichomonas vaginosis: NEGATIVE

## 2017-09-17 LAB — TSH: TSH: 0.142 u[IU]/mL — ABNORMAL LOW (ref 0.450–4.500)

## 2017-09-17 LAB — FOLLICLE STIMULATING HORMONE: FSH: 43.4 m[IU]/mL

## 2017-09-18 LAB — URINE CULTURE

## 2017-09-23 ENCOUNTER — Ambulatory Visit
Admission: RE | Admit: 2017-09-23 | Discharge: 2017-09-23 | Disposition: A | Discharge status: Home / Self Care | Payer: BLUE CROSS/BLUE SHIELD | Source: Ambulatory Visit | Attending: Family Medicine | Admitting: Family Medicine

## 2017-09-23 ENCOUNTER — Ambulatory Visit: Payer: Self-pay

## 2017-09-23 DIAGNOSIS — Z1231 Encounter for screening mammogram for malignant neoplasm of breast: Secondary | ICD-10-CM

## 2017-09-24 ENCOUNTER — Ambulatory Visit (INDEPENDENT_AMBULATORY_CARE_PROVIDER_SITE_OTHER): Payer: BLUE CROSS/BLUE SHIELD

## 2017-09-24 VITALS — BP 118/70 | HR 80

## 2017-09-24 DIAGNOSIS — R319 Hematuria, unspecified: Secondary | ICD-10-CM

## 2017-09-24 LAB — POCT URINALYSIS DIPSTICK
Bilirubin, UA: NEGATIVE
Blood, UA: NEGATIVE
Glucose, UA: NEGATIVE
Ketones, UA: NEGATIVE
Leukocytes, UA: NEGATIVE
Nitrite, UA: NEGATIVE
Protein, UA: NEGATIVE
Urobilinogen, UA: 0.2 E.U./dL
pH, UA: 5 (ref 5.0–8.0)

## 2017-09-24 NOTE — Progress Notes (Signed)
Patient came in today for urinalysis recheck.

## 2017-09-30 ENCOUNTER — Ambulatory Visit: Payer: BLUE CROSS/BLUE SHIELD

## 2017-10-21 ENCOUNTER — Ambulatory Visit: Payer: BLUE CROSS/BLUE SHIELD | Admitting: Podiatry

## 2017-10-21 ENCOUNTER — Ambulatory Visit (INDEPENDENT_AMBULATORY_CARE_PROVIDER_SITE_OTHER): Payer: BLUE CROSS/BLUE SHIELD

## 2017-10-21 ENCOUNTER — Encounter: Payer: Self-pay | Admitting: Podiatry

## 2017-10-21 VITALS — BP 116/79 | HR 90 | Resp 16

## 2017-10-21 DIAGNOSIS — M779 Enthesopathy, unspecified: Secondary | ICD-10-CM

## 2017-10-21 MED ORDER — TRIAMCINOLONE ACETONIDE 10 MG/ML IJ SUSP
10.0000 mg | Freq: Once | INTRAMUSCULAR | Status: AC
  Administered 2017-10-21: 10 mg

## 2017-10-21 NOTE — Progress Notes (Signed)
Subjective:   Patient ID: Chelsea Gross, female   DOB: 52 y.o.   MRN: [id number]   HPI Patient presents stating that she is getting discomfort in the bottom of his left big toe joint and its been going on around 6 months.  Flat shoes hurt in shoes with a high heel hurt and she states that while the pain is acceptable at times it is intense.  Patient does not feel like she is walking differently and currently does not smoke and likes to be active   Review of Systems  All other systems reviewed and are negative.       Objective:  Physical Exam  Constitutional: She appears well-developed and well-nourished.  Cardiovascular: Intact distal pulses.  Pulmonary/Chest: Effort normal.  Musculoskeletal: Normal range of motion.  Neurological: She is alert.  Skin: Skin is warm.  Nursing note and vitals reviewed.   Neurovascular status found to be intact muscle strength is adequate range of motion within normal limits with patient found to have quite a bit of discomfort in the plantar aspect of the left first metatarsal with inflammation and fluid around the fibular sesamoidal complex.  Tibial sesamoid seems good and there is no other significant pathology noted.  Patient has good digital perfusion well oriented x3     Assessment:  Inflammatory sesamoiditis left foot with possibility for fracture present     Plan:  H&P condition reviewed.  It is been present for 6 months so if there is a crack I believe it would aborted heel so I do think treating this from an inflammatory standpoint will be of benefit and hopefully we can prevent surgery on this.  I did explain the possibility for crackling may need to get an MRI on it at one point in future.  Today I did do a careful injection around the fibular sesamoid 3 mg dexamethasone Kenalog 5 mg Xylocaine and applied dancers pad to reduce pressure and reappoint again in 3 weeks to see results and she may be able to get into an orthotic to disperse  weight off this area  X-ray indicates there may be a small damage to the fibular sesamoid but difficult to say from this initial x-ray

## 2017-10-25 ENCOUNTER — Other Ambulatory Visit: Payer: Self-pay | Admitting: Obstetrics and Gynecology

## 2017-11-11 ENCOUNTER — Encounter: Payer: Self-pay | Admitting: Podiatry

## 2017-11-11 ENCOUNTER — Ambulatory Visit: Payer: BLUE CROSS/BLUE SHIELD

## 2017-11-11 ENCOUNTER — Ambulatory Visit: Payer: BLUE CROSS/BLUE SHIELD | Admitting: Podiatry

## 2017-11-11 DIAGNOSIS — M779 Enthesopathy, unspecified: Secondary | ICD-10-CM

## 2017-11-11 DIAGNOSIS — M79672 Pain in left foot: Secondary | ICD-10-CM

## 2017-11-14 NOTE — Progress Notes (Signed)
Subjective:   Patient ID: Chelsea HicksDelphia Grates Gross, female   DOB: 52 y.o.   MRN: [id number]   HPI Patient presents stating she is quite a bit improved with mild discomfort with palpation   ROS      Objective:  Physical Exam  Neurovascular status intact with diminished discomfort second MPJ left with pain still present but minimal with minimal swelling noted     Assessment:  Inflammatory capsulitis improved left     Plan:  H&P condition reviewed and recommended padding therapy and ice therapy with rigid bottom shoes and if symptoms were to come back we have recommended other treatments for this

## 2017-12-14 ENCOUNTER — Telehealth: Payer: Self-pay | Admitting: Obstetrics and Gynecology

## 2017-12-14 DIAGNOSIS — R319 Hematuria, unspecified: Secondary | ICD-10-CM

## 2017-12-14 DIAGNOSIS — R350 Frequency of micturition: Secondary | ICD-10-CM

## 2017-12-14 DIAGNOSIS — R300 Dysuria: Secondary | ICD-10-CM

## 2017-12-14 NOTE — Telephone Encounter (Signed)
Spoke with patient. Patient would like referral to Urology. States that she recurring urinary symptoms. Denies symptoms currently. States they come  Every 2-3 weeks. Urine showed some blood and crystals while in office on 09/16/2017. Per result note on 09/18/17 from Dr.Jertson if she has recurring symptoms can refer to urology. Referral to [facility] placed. Patient is aware she will be contacted to schedule appointment.  Cc: Soundra Pilon for faxing notes and referral coordination  Routing to provider for final review. Patient agreeable to disposition. Will close encounter.

## 2017-12-14 NOTE — Telephone Encounter (Signed)
Patient calling inquiring about a doctor's referral for kidney stones.

## 2017-12-16 ENCOUNTER — Telehealth: Payer: Self-pay | Admitting: Obstetrics and Gynecology

## 2017-12-16 NOTE — Telephone Encounter (Signed)
Left voicemail regarding referral appointment. The information is listed below. Should the patient need to cancel or reschedule this appointment, Please advise them to call the office they've been referred to in order to reschedule.  [facility] [address] [place] [address] [place] [facility] [place], [place] [address] Phone: [phone]  Appointment scheduled: 01/06/18 @ 1:15 pm. Please arrive 15 minutes early and bring your insurance card and photo id and list of medications.

## 2017-12-19 ENCOUNTER — Encounter (HOSPITAL_BASED_OUTPATIENT_CLINIC_OR_DEPARTMENT_OTHER): Payer: Self-pay | Admitting: Emergency Medicine

## 2017-12-19 ENCOUNTER — Emergency Department (HOSPITAL_BASED_OUTPATIENT_CLINIC_OR_DEPARTMENT_OTHER)
Admission: EM | Admit: 2017-12-19 | Discharge: 2017-12-20 | Disposition: A | Discharge status: Home / Self Care | Payer: BLUE CROSS/BLUE SHIELD | Attending: Emergency Medicine | Admitting: Emergency Medicine

## 2017-12-19 ENCOUNTER — Other Ambulatory Visit: Payer: Self-pay

## 2017-12-19 DIAGNOSIS — Z79899 Other long term (current) drug therapy: Secondary | ICD-10-CM | POA: Diagnosis not present

## 2017-12-19 DIAGNOSIS — Y999 Unspecified external cause status: Secondary | ICD-10-CM | POA: Insufficient documentation

## 2017-12-19 DIAGNOSIS — S41112A Laceration without foreign body of left upper arm, initial encounter: Secondary | ICD-10-CM | POA: Diagnosis not present

## 2017-12-19 DIAGNOSIS — W01118A Fall on same level from slipping, tripping and stumbling with subsequent striking against other sharp object, initial encounter: Secondary | ICD-10-CM | POA: Diagnosis not present

## 2017-12-19 DIAGNOSIS — S41111A Laceration without foreign body of right upper arm, initial encounter: Secondary | ICD-10-CM | POA: Insufficient documentation

## 2017-12-19 DIAGNOSIS — S4991XA Unspecified injury of right shoulder and upper arm, initial encounter: Secondary | ICD-10-CM | POA: Diagnosis present

## 2017-12-19 DIAGNOSIS — Z23 Encounter for immunization: Secondary | ICD-10-CM | POA: Diagnosis not present

## 2017-12-19 DIAGNOSIS — S4992XA Unspecified injury of left shoulder and upper arm, initial encounter: Secondary | ICD-10-CM | POA: Diagnosis not present

## 2017-12-19 DIAGNOSIS — E039 Hypothyroidism, unspecified: Secondary | ICD-10-CM | POA: Insufficient documentation

## 2017-12-19 DIAGNOSIS — Y93K1 Activity, walking an animal: Secondary | ICD-10-CM | POA: Insufficient documentation

## 2017-12-19 DIAGNOSIS — Y9289 Other specified places as the place of occurrence of the external cause: Secondary | ICD-10-CM | POA: Insufficient documentation

## 2017-12-19 MED ORDER — TETANUS-DIPHTH-ACELL PERTUSSIS 5-2.5-18.5 LF-MCG/0.5 IM SUSP
0.5000 mL | Freq: Once | INTRAMUSCULAR | Status: AC
  Administered 2017-12-20: 0.5 mL via INTRAMUSCULAR
  Filled 2017-12-19: qty 0.5

## 2017-12-19 MED ORDER — LIDOCAINE HCL (PF) 1 % IJ SOLN
10.0000 mL | Freq: Once | INTRAMUSCULAR | Status: AC
  Administered 2017-12-20: 10 mL
  Filled 2017-12-19: qty 10

## 2017-12-19 NOTE — ED Triage Notes (Signed)
Patient states that she was pulled down by her dog - she has multiple lacerations to her bilateral arms - all have bleeding controlled

## 2017-12-20 MED ORDER — CEPHALEXIN 250 MG PO CAPS
500.0000 mg | ORAL_CAPSULE | Freq: Once | ORAL | Status: AC
  Administered 2017-12-20: 500 mg via ORAL
  Filled 2017-12-20: qty 2

## 2017-12-20 MED ORDER — CEPHALEXIN 500 MG PO CAPS: 500.0000 mg | ORAL_CAPSULE | Freq: Four times a day (QID) | ORAL | 0 refills | Status: DC

## 2017-12-20 NOTE — ED Notes (Signed)
Pt given d/c instructions as per chart. Rx x 1. Verbalizes understanding. no questions.

## 2017-12-20 NOTE — Discharge Instructions (Addendum)
Treatment: Keep your wound dry and dressing applied until this time tomorrow. After 24 hours, you may wash with warm soapy water. Dry and apply antibiotic ointment and clean dressing. Do this daily until your sutures are removed.  You can take ibuprofen and Tylenol as prescribed over-the-counter, as needed for your pain.  Follow-up: Please follow-up with your primary care provider or return to emergency department in 10 days for suture removal. Be aware of signs of infection: fever, increasing pain, redness, swelling, drainage from the area. Please call your primary care provider or return to emergency department if you develop any of these symptoms or if any of the sutures come out prior to removal. Please return to the emergency department if you develop any other new or worsening symptoms.

## 2017-12-20 NOTE — ED Provider Notes (Addendum)
[facility] EMERGENCY DEPARTMENT Provider Note   CSN: [id number] Arrival date & time: 12/19/17  2038     History   Chief Complaint Chief Complaint  Patient presents with  . Laceration    HPI Chelsea Gross is a 52 y.o. female with history of anxiety, depression, hyperlipidemia, hypothyroidism who presents with lacerations to bilateral arms.  Patient reports she was walking her dog when her dog pulled her over and she fell into some metal.  She she did not hit her head or lose consciousness.  She denies any other injuries.  Her tetanus is not up-to-date.  She denies any numbness or tingling or difficulty moving her hands or fingers.  HPI  Past Medical History:  Diagnosis Date  . Anxiety   . Hyperlipemia   . Hypothyroid   . Thyroid disease   . Urinary incontinence     Patient Active Problem List   Diagnosis Date Noted  . Anxiety 04/16/2016  . Depression 04/16/2016  . Hyperlipidemia 04/16/2016  . Hypothyroidism 04/16/2016    Past Surgical History:  Procedure Laterality Date  . chin and lip surgery  2001  . labial repair  2000     OB History    Gravida  1   Para  1   Term  1   Preterm      AB      Living  1     SAB      TAB      Ectopic      Multiple      Live Births  1            Home Medications    Prior to Admission medications   Medication Sig Start Date End Date Taking? Authorizing Provider  ALPRAZolam (XANAX) 0.25 MG tablet Take 0.25 mg by mouth at bedtime as needed.  12/03/15   [provider]  atorvastatin (LIPITOR) 40 MG tablet Take 40 mg by mouth daily at 6 PM.  12/16/15   [provider]  cephALEXin (KEFLEX) 500 MG capsule Take 1 capsule (500 mg total) by mouth 4 (four) times daily. 12/20/17   Mahayla Haddaway, Waylan Boga, PA-C  DULoxetine (CYMBALTA) 60 MG capsule Take 60 mg by mouth daily.  09/19/15   [provider]  levothyroxine (SYNTHROID, LEVOTHROID) 25 MCG tablet Take 25 mcg by mouth daily before  breakfast.  12/03/15   [provider]  medroxyPROGESTERone (PROVERA) 5 MG tablet Take one tablet a day for 5 days now and every other month if no spontaneous menses 09/16/17   Romualdo Bolk, MD  metroNIDAZOLE (METROGEL) 1 % gel Apply topically daily. For face 10/22/16   [provider]  nitrofurantoin, macrocrystal-monohydrate, (MACROBID) 100 MG capsule Take 1 capsule (100 mg total) by mouth 2 (two) times daily. 09/16/17   Romualdo Bolk, MD  omeprazole (PRILOSEC) 20 MG capsule daily as needed. 01/04/17   [provider]  triamcinolone lotion (KENALOG) 0.1 % ataa bid 10/22/16   [provider]  valACYclovir (VALTREX) 1000 MG tablet Take 1,000 mg by mouth as needed.  10/10/15   [provider]    Family History Family History  Problem Relation Age of Onset  . Heart attack Mother   . Esophageal cancer Mother   . Heart attack Father   . Hyperlipidemia Sister   . Hyperlipidemia Sister   . Hyperlipidemia Sister   . Hyperlipidemia Sister     Social History Social History   Tobacco Use  .  Smoking status: Never Smoker  . Smokeless tobacco: Never Used  Substance Use Topics  . Alcohol use: No    Alcohol/week: 0.0 oz  . Drug use: No     Allergies   Sulfa antibiotics and Pantoprazole   Review of Systems Review of Systems  Constitutional: Negative for chills and fever.  HENT: Negative for facial swelling and sore throat.   Respiratory: Negative for shortness of breath.   Cardiovascular: Negative for chest pain.  Gastrointestinal: Negative for abdominal pain, nausea and vomiting.  Genitourinary: Negative for dysuria.  Musculoskeletal: Negative for back pain.  Skin: Positive for wound. Negative for rash.  Neurological: Negative for numbness and headaches.  Psychiatric/Behavioral: The patient is not nervous/anxious.      Physical Exam Updated Vital Signs BP 133/75 (BP Location: Left Arm)   Pulse (!) 109   Temp 98.2 F (36.8  C) (Oral)   Resp 18   Ht 5' 4.5" (1.638 m)   LMP 12/12/2017   SpO2 93%   Physical Exam  Constitutional: She appears well-developed and well-nourished. No distress.  HENT:  Head: Normocephalic and atraumatic.  Mouth/Throat: Oropharynx is clear and moist. No oropharyngeal exudate.  Eyes: Pupils are equal, round, and reactive to light. Conjunctivae are normal. Right eye exhibits no discharge. Left eye exhibits no discharge. No scleral icterus.  Neck: Normal range of motion. Neck supple. No thyromegaly present.  Cardiovascular: Normal rate, regular rhythm, normal heart sounds and intact distal pulses. Exam reveals no gallop and no friction rub.  No murmur heard. Pulmonary/Chest: Effort normal and breath sounds normal. No stridor. No respiratory distress. She has no wheezes. She has no rales.  Abdominal: Soft. Bowel sounds are normal. She exhibits no distension. There is no tenderness. There is no rebound and no guarding.  Musculoskeletal: She exhibits no edema.  Full range of motion of all digits and wrists without difficulty, sensation intact, cap refill less than 2 seconds, DP pulse intact No bony tenderness  Lymphadenopathy:    She has no cervical adenopathy.  Neurological: She is alert. Coordination normal.  Skin: Skin is warm and dry. No rash noted. She is not diaphoretic. No pallor.  Laceration on the left anterior forearm with adipose exposed, 5 cm Laceration on the right anterior forearm with lesser adipose exposed, 5 cm Several other small, very superficial laceration surrounding bilaterally  Psychiatric: She has a normal mood and affect.  Nursing note and vitals reviewed.    ED Treatments / Results  Labs (all labs ordered are listed, but only abnormal results are displayed) Labs Reviewed - No data to display  EKG None  Radiology No results found.  Procedures .Marland KitchenLaceration Repair Date/Time: 12/20/2017 12:06 AM Performed by: Emi Holes, PA-C Authorized by: Emi Holes, PA-C   Consent:    Consent obtained:  Verbal   Consent given by:  Patient   Risks discussed:  Infection, pain and poor cosmetic result   Alternatives discussed:  No treatment Anesthesia (see MAR for exact dosages):    Anesthesia method:  Local infiltration   Local anesthetic:  Lidocaine 1% w/o epi Laceration details:    Location:  Shoulder/arm   Shoulder/arm location:  L lower arm   Length (cm):  5 Repair type:    Repair type:  Simple Pre-procedure details:    Preparation:  Patient was prepped and draped in usual sterile fashion Exploration:    Hemostasis achieved with:  Direct pressure   Wound exploration: wound explored through full range of motion  Wound extent: no muscle damage noted     Contaminated: no   Treatment:    Area cleansed with:  Saline   Amount of cleaning:  Standard   Irrigation solution:  Sterile saline   Irrigation volume:  100 mL   Irrigation method:  Pressure wash   Visualized foreign bodies/material removed: no   Skin repair:    Repair method:  Sutures   Suture size:  5-0   Wound skin closure material used: Ethilon.   Suture technique:  Simple interrupted   Number of sutures:  5 Approximation:    Approximation:  Close Post-procedure details:    Dressing:  Antibiotic ointment and non-adherent dressing   Patient tolerance of procedure:  Tolerated well, no immediate complications .Marland KitchenLaceration Repair Date/Time: 12/20/2017 12:08 AM Performed by: Emi Holes, PA-C Authorized by: Emi Holes, PA-C   Consent:    Consent obtained:  Verbal   Consent given by:  Patient   Risks discussed:  Infection and pain   Alternatives discussed:  No treatment Anesthesia (see MAR for exact dosages):    Anesthesia method:  Local infiltration   Local anesthetic:  Lidocaine 1% w/o epi Laceration details:    Location:  Shoulder/arm   Shoulder/arm location:  R lower arm   Length (cm):  5 Repair type:    Repair type:  Simple Pre-procedure  details:    Preparation:  Patient was prepped and draped in usual sterile fashion Exploration:    Hemostasis achieved with:  Direct pressure   Wound exploration: wound explored through full range of motion and entire depth of wound probed and visualized     Wound extent: no foreign bodies/material noted and no muscle damage noted     Contaminated: no   Treatment:    Area cleansed with:  Saline   Amount of cleaning:  Standard   Irrigation volume:  125   Irrigation method:  Pressure wash   Visualized foreign bodies/material removed: no   Skin repair:    Repair method:  Sutures   Suture size:  5-0   Wound skin closure material used: Ethilon.   Suture technique:  Simple interrupted   Number of sutures:  6 Approximation:    Approximation:  Close Post-procedure details:    Dressing:  Antibiotic ointment and non-adherent dressing   Patient tolerance of procedure:  Tolerated well, no immediate complications   (including critical care time)  Medications Ordered in ED Medications  lidocaine (PF) (XYLOCAINE) 1 % injection 10 mL (has no administration in time range)  Tdap (BOOSTRIX) injection 0.5 mL (has no administration in time range)  cephALEXin (KEFLEX) capsule 500 mg (has no administration in time range)     Initial Impression / Assessment and Plan / ED Course  I have reviewed the triage vital signs and the nursing notes.  Pertinent labs & imaging results that were available during my care of the patient were reviewed by me and considered in my medical decision making (see chart for details).     Tetanus updated in ED. Lacerations occurred < 12 hours prior to repair. Discussed laceration care with pt and answered questions. Pt to f-u for suture removal in 10 days and wound check sooner should there be signs of dehiscence or infection.  Will discharge home with Keflex for prophylaxis considering dirty metal.  Patient understands and agrees with plan.  Patient vitals stable and  discharged in satisfactory condition.  Pt is hemodynamically stable with no complaints prior to dc.  Final Clinical Impressions(s) / ED Diagnoses   Final diagnoses:  Laceration of left upper extremity, initial encounter  Laceration of right upper extremity, initial encounter    ED Discharge Orders        Ordered    cephALEXin (KEFLEX) 500 MG capsule  4 times daily     12/20/17 0003          Emi Holes, PA-C 12/20/17 Leanord Hawking    Arby Barrette, MD 12/28/17 1451

## 2017-12-23 NOTE — Telephone Encounter (Signed)
Left voicemail regarding referral appointment. The information is listed below. Should the patient need to cancel or reschedule this appointment, Please advise them to call the office they've been referred to in order to reschedule.  [facility] [address] [place] [address] [facility] [place], [place] [address] Phone: [phone]  Dr. Alvester Morin 01/06/18 @ 1:15 pm. Please arrive 15 minutes early and bring your insurance card and photo id and list of medications.

## 2018-03-08 NOTE — Progress Notes (Signed)
52 y.o. [id number] Married Caucasian Female here for annual exam.   Period Cycle (Days): (perimenopausal. last cycle was 6mths ago). Prior cycle had been 4-5 months ago. Prior to the last 6 months she took provera x 2, first time she had heavy bleeding, 2nd time no w/d bleed. No significant hot flashes or night sweats. She does get hotter in general. She is feeling sad, teary. On Cymbalta and lexapro. Symptoms started in the last 6 months. Doesn't think she is depressed. She goes from a bad mood to a good mood for no reason. Tired at work. Falls asleep okay, wakes up not feeling rested. She does snore.  Some vaginal dryness. She feels some pelvic soreness intermittently. No dyspareunia. Intermittent urinary frequency and urgency. Not all the time.    She has a known right bartholin's cyst, present for years, unchanged.   She has some small kidney stones, previously having pain, currently fine.  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 No outbreaks on valtrex  No LMP recorded. (Menstrual status: Perimenopausal).          Sexually active: Yes.    The current method of family planning is none.    Exercising: No.  exercise Smoker:  no  Health Maintenance: Pap:  01/30/2016 neg HPV HR neg History of abnormal Pap:  no MMG: 09/23/2017 BI-RADS CATEGORY  1: Negative Colonoscopy:  2/19 f/u 3051yrs BMD:   never TDaP:  12/20/2017, 01/30/2016 Gardasil: never   reports that she has never smoked. She has never used smokeless tobacco. She reports that she has current or past drug history. She reports that she does not drink alcohol.  CMA in primary care MD. Daughter is [id number], in college.  Past Medical History:  Diagnosis Date  . Anxiety   . Hyperlipemia   . Hypothyroid   . Thyroid disease   . Urinary incontinence     Past Surgical History:  Procedure Laterality Date  . chin and lip surgery  2001  . labial repair  2000    Current Outpatient Medications  Medication Sig Dispense  Refill  . ALPRAZolam (XANAX) 0.25 MG tablet Take 0.25 mg by mouth at bedtime as needed.   1  . atorvastatin (LIPITOR) 40 MG tablet Take 40 mg by mouth daily at 6 PM.   3  . DULoxetine (CYMBALTA) 60 MG capsule Take 60 mg by mouth daily.   1  . escitalopram (LEXAPRO) 10 MG tablet TAKE 1 TABLET BY MOUTH ONCE DAILY    . levothyroxine (SYNTHROID, LEVOTHROID) 25 MCG tablet Take 25 mcg by mouth daily before breakfast.   1  . metroNIDAZOLE (METROGEL) 1 % gel Apply topically daily. For face    . omeprazole (PRILOSEC) 20 MG capsule daily as needed.  6  . triamcinolone lotion (KENALOG) 0.1 % ataa bid    . valACYclovir (VALTREX) 1000 MG tablet Take 1,000 mg by mouth as needed.   0   No current facility-administered medications for this visit.     Family History  Problem Relation Age of Onset  . Heart attack Mother   . Esophageal cancer Mother   . Heart attack Father   . Hyperlipidemia Sister   . Hyperlipidemia Sister   . Hyperlipidemia Sister   . Hyperlipidemia Sister     Review of Systems  Constitutional:       Craving sweets  HENT: Negative.   Eyes: Negative.   Respiratory: Negative.   Cardiovascular: Negative.   Gastrointestinal: Negative.  Endocrine: Negative.   Genitourinary: Positive for frequency and urgency.       Menstrual cycle changes, occ frequent/urgency of urination  Musculoskeletal: Negative.   Skin: Negative.   Allergic/Immunologic: Negative.   Neurological: Negative.   Hematological: Negative.   Psychiatric/Behavioral: Negative.   All other systems reviewed and are negative.   Exam:   BP 120/82   Pulse 80   Resp 16   Ht 5' 4.25" (1.632 m)   Weight change: @WEIGHTCHANGE @ Height:   Height: 5' 4.25" (163.2 cm)  Ht Readings from Last 3 Encounters:  03/10/18 5' 4.25" (1.632 m)  12/19/17 5' 4.5" (1.638 m)  02/11/17 5' 4.25" (1.632 m)    General appearance: alert, cooperative and appears stated age Head: Normocephalic, without obvious abnormality,  atraumatic Neck: no adenopathy, supple, symmetrical, trachea midline and thyroid normal to inspection and palpation Lungs: clear to auscultation bilaterally Cardiovascular: regular rate and rhythm Breasts: normal appearance, no masses or tenderness Abdomen: soft, non-tender; non distended,  no masses,  no organomegaly Extremities: extremities normal, atraumatic, no cyanosis or edema Skin: Skin color, texture, turgor normal. No rashes or lesions Lymph nodes: Cervical, supraclavicular, and axillary nodes normal. No abnormal inguinal nodes palpated Neurologic: Grossly normal   Pelvic: External genitalia:  no lesions              Urethra:  normal appearing urethra with no masses, tenderness or lesions              Bartholins and Skenes: normal                 Vagina: normal appearing vagina with normal color and discharge, no lesions (not atrophic)              Cervix: no lesions               Bimanual Exam:  Uterus:  normal size, contour, position, consistency, mobility, non-tender              Adnexa: no mass, fullness, tenderness               Rectovaginal: Confirms               Anus:  normal sphincter tone, no lesions  Chaperone was present for exam.  A:  Well Woman with normal exam  Mood changes, feels it is from Menopause  On medications for depression  Not rested after sleeping, snoring  P:   Pap next year  Labs with primary  Mammogram and colonoscopy UTD  Recommended she discuss a sleep study with her primary  She is seeing her primary next week  Recommend she discuss adjustment in her depression medications with her primary  Discussed HRT. HRT is not recommended for mood changes. She isn't having significant vasomotor symptoms  Discussed risks of HRT. She will get in touch if she wants to discuss further.

## 2018-03-10 ENCOUNTER — Other Ambulatory Visit: Payer: Self-pay

## 2018-03-10 ENCOUNTER — Encounter: Payer: Self-pay | Admitting: Obstetrics and Gynecology

## 2018-03-10 ENCOUNTER — Ambulatory Visit: Payer: BLUE CROSS/BLUE SHIELD | Admitting: Obstetrics and Gynecology

## 2018-03-10 VITALS — BP 120/82 | HR 80 | Resp 16 | Ht 64.25 in

## 2018-03-10 DIAGNOSIS — N951 Menopausal and female climacteric states: Secondary | ICD-10-CM | POA: Diagnosis not present

## 2018-03-10 DIAGNOSIS — Z01419 Encounter for gynecological examination (general) (routine) without abnormal findings: Secondary | ICD-10-CM | POA: Diagnosis not present

## 2018-03-10 DIAGNOSIS — R4586 Emotional lability: Secondary | ICD-10-CM

## 2018-03-10 NOTE — Patient Instructions (Addendum)
EXERCISE AND DIET:  We recommended that you start or continue a regular exercise program for good health. Regular exercise means any activity that makes your heart beat faster and makes you sweat.  We recommend exercising at least 30 minutes per day at least 3 days a week, preferably 4 or 5.  We also recommend a diet low in fat and sugar.  Inactivity, poor dietary choices and obesity can cause diabetes, heart attack, stroke, and kidney damage, among others.    ALCOHOL AND SMOKING:  Women should limit their alcohol intake to no more than 7 drinks/beers/glasses of wine (combined, not each!) per week. Moderation of alcohol intake to this level decreases your risk of breast cancer and liver damage. And of course, no recreational drugs are part of a healthy lifestyle.  And absolutely no smoking or even second hand smoke. Most people know smoking can cause heart and lung diseases, but did you know it also contributes to weakening of your bones? Aging of your skin?  Yellowing of your teeth and nails?  CALCIUM AND VITAMIN D:  Adequate intake of calcium and Vitamin D are recommended.  The recommendations for exact amounts of these supplements seem to change often, but generally speaking 600 mg of calcium (either carbonate or citrate) and 800 units of Vitamin D per day seems prudent. Certain women may benefit from higher intake of Vitamin D.  If you are among these women, your doctor will have told you during your visit.    PAP SMEARS:  Pap smears, to check for cervical cancer or precancers,  have traditionally been done yearly, although recent scientific advances have shown that most women can have pap smears less often.  However, every woman still should have a physical exam from her gynecologist every year. It will include a breast check, inspection of the vulva and vagina to check for abnormal growths or skin changes, a visual exam of the cervix, and then an exam to evaluate the size and shape of the uterus and  ovaries.  And after 53 years of age, a rectal exam is indicated to check for rectal cancers. We will also provide age appropriate advice regarding health maintenance, like when you should have certain vaccines, screening for sexually transmitted diseases, bone density testing, colonoscopy, mammograms, etc.   MAMMOGRAMS:  All women over 40 years old should have a yearly mammogram. Many facilities now offer a "3D" mammogram, which may cost around $50 extra out of pocket. If possible,  we recommend you accept the option to have the 3D mammogram performed.  It both reduces the number of women who will be called back for extra views which then turn out to be normal, and it is better than the routine mammogram at detecting truly abnormal areas.    COLONOSCOPY:  Colonoscopy to screen for colon cancer is recommended for all women at age 50.  We know, you hate the idea of the prep.  We agree, BUT, having colon cancer and not knowing it is worse!!  Colon cancer so often starts as a polyp that can be seen and removed at colonscopy, which can quite literally save your life!  And if your first colonoscopy is normal and you have no family history of colon cancer, most women don't have to have it again for 10 years.  Once every ten years, you can do something that may end up saving your life, right?  We will be happy to help you get it scheduled when you are ready.    Be sure to check your insurance coverage so you understand how much it will cost.  It may be covered as a preventative service at no cost, but you should check your particular policy.      Menopause and Hormone Replacement Therapy What is hormone replacement therapy? Hormone replacement therapy (HRT) is the use of artificial (synthetic) hormones to replace hormones that your body stops producing during menopause. Menopause is the normal time of life when menstrual periods stop completely and the ovaries stop producing the female hormones estrogen and  progesterone. This lack of hormones can affect your health and cause undesirable symptoms. HRT can relieve some of those symptoms. What are my options for HRT? HRT may consist of the synthetic hormones estrogen and progestin, or it may consist of only estrogen (estrogen-only therapy). You and your health care provider will decide which form of HRT is best for you. If you choose to be on HRT and you have a uterus, estrogen and progestin are usually prescribed. Estrogen-only therapy is used for women who do not have a uterus. Possible options for taking HRT include:  Pills.  Patches.  Gels.  Sprays.  Vaginal cream.  Vaginal rings.  Vaginal inserts.  The amount of hormone(s) that you take and how long you take the hormone(s) varies depending on your individual health. It is important to:  Begin HRT with the lowest possible dosage.  Stop HRT as soon as your health care provider tells you to stop.  Work with your health care provider so that you feel informed and comfortable with your decisions.  What are the benefits of HRT? HRT can reduce the frequency and severity of menopausal symptoms. Benefits of HRT vary depending on the menopausal symptoms that you have, the severity of your symptoms, and your overall health. HRT may help to improve the following menopausal symptoms:  Hot flashes and night sweats. These are sudden feelings of heat that spread over the face and body. The skin may turn red, like a blush. Night sweats are hot flashes that happen while you are sleeping or trying to sleep.  Bone loss (osteoporosis). The body loses calcium more quickly after menopause, causing the bones to become weaker. This can increase the risk for bone breaks (fractures).  Vaginal dryness. The lining of the vagina can become thin and dry, which can cause pain during sexual intercourse or cause infection, burning, or itching.  Urinary tract infections.  Urinary incontinence. This is a  decreased ability to control when you urinate.  Irritability.  Short-term memory problems.  What are the risks of HRT? Risks of HRT vary depending on your individual health and medical history. Risks of HRT also depend on whether you receive both estrogen and progestin or you receive estrogen only.HRT may increase the risk of:  Spotting. This is when a small amount of bloodleaks from the vagina unexpectedly.  Endometrial cancer. This cancer is in the lining of the uterus (endometrium).  Breast cancer.  Increased density of breast tissue. This can make it harder to find breast cancer on a breast X-ray (mammogram).  Stroke.  Heart attack.  Blood clots.  Gallbladder disease.  Risks of HRT can increase if you have any of the following conditions:  Endometrial cancer.  Liver disease.  Heart disease.  Breast cancer.  History of blood clots.  History of stroke.  How should I care for myself while I am on HRT?  Take over-the-counter and prescription medicines only as told by your health care   provider.  Get mammograms, pelvic exams, and medical checkups as often as told by your health care provider.  Have Pap tests done as often as told by your health care provider. A Pap test is sometimes called a Pap smear. It is a screening test that is used to check for signs of cancer of the cervix and vagina. A Pap test can also identify the presence of infection or precancerous changes. Pap tests may be done: ? Every 3 years, starting at age 21. ? Every 5 years, starting after age 30, in combination with testing for human papillomavirus (HPV). ? More often or less often depending on other medical conditions you have, your age, and other risk factors.  It is your responsibility to get your Pap test results. Ask your health care provider or the department performing the test when your results will be ready.  Keep all follow-up visits as told by your health care provider. This is  important. When should I seek medical care? Talk with your health care provider if:  You have any of these: ? Pain or swelling in your legs. ? Shortness of breath. ? Chest pain. ? Lumps or changes in your breasts or armpits. ? Slurred speech. ? Pain, burning, or bleeding when you urine.  You develop any of these: ? Unusual vaginal bleeding. ? Dizziness or headaches. ? Weakness or numbness in any part of your arms or legs. ? Pain in your abdomen.  This information is not intended to replace advice given to you by your health care provider. Make sure you discuss any questions you have with your health care provider. Document Released: 04/11/2003 Document Revised: 06/09/2016 Document Reviewed: 01/14/2015 Elsevier Interactive Patient Education  2017 [employer]  Breast Self-Awareness Breast self-awareness means being familiar with how your breasts look and feel. It involves checking your breasts regularly and reporting any changes to your health care provider. Practicing breast self-awareness is important. A change in your breasts can be a sign of a serious medical problem. Being familiar with how your breasts look and feel allows you to find any problems early, when treatment is more likely to be successful. All women should practice breast self-awareness, including women who have had breast implants. How to do a breast self-exam One way to learn what is normal for your breasts and whether your breasts are changing is to do a breast self-exam. To do a breast self-exam: Look for Changes  1. Remove all the clothing above your waist. 2. Stand in front of a mirror in a room with good lighting. 3. Put your hands on your hips. 4. Push your hands firmly downward. 5. Compare your breasts in the mirror. Look for differences between them (asymmetry), such as: ? Differences in shape. ? Differences in size. ? Puckers, dips, and bumps in one breast and not the other. 6. Look at each breast  for changes in your skin, such as: ? Redness. ? Scaly areas. 7. Look for changes in your nipples, such as: ? Discharge. ? Bleeding. ? Dimpling. ? Redness. ? A change in position. Feel for Changes  Carefully feel your breasts for lumps and changes. It is best to do this while lying on your back on the floor and again while sitting or standing in the shower or tub with soapy water on your skin. Feel each breast in the following way:  Place the arm on the side of the breast you are examining above your head.  Feel your breast with the   other hand.  Start in the nipple area and make  inch (2 cm) overlapping circles to feel your breast. Use the pads of your three middle fingers to do this. Apply light pressure, then medium pressure, then firm pressure. The light pressure will allow you to feel the tissue closest to the skin. The medium pressure will allow you to feel the tissue that is a little deeper. The firm pressure will allow you to feel the tissue close to the ribs.  Continue the overlapping circles, moving downward over the breast until you feel your ribs below your breast.  Move one finger-width toward the center of the body. Continue to use the  inch (2 cm) overlapping circles to feel your breast as you move slowly up toward your collarbone.  Continue the up and down exam using all three pressures until you reach your armpit.  Write Down What You Find  Write down what is normal for each breast and any changes that you find. Keep a written record with breast changes or normal findings for each breast. By writing this information down, you do not need to depend only on memory for size, tenderness, or location. Write down where you are in your menstrual cycle, if you are still menstruating. If you are having trouble noticing differences in your breasts, do not get discouraged. With time you will become more familiar with the variations in your breasts and more comfortable with the  exam. How often should I examine my breasts? Examine your breasts every month. If you are breastfeeding, the best time to examine your breasts is after a feeding or after using a breast pump. If you menstruate, the best time to examine your breasts is 5-7 days after your period is over. During your period, your breasts are lumpier, and it may be more difficult to notice changes. When should I see my health care provider? See your health care provider if you notice:  A change in shape or size of your breasts or nipples.  A change in the skin of your breast or nipples, such as a reddened or scaly area.  Unusual discharge from your nipples.  A lump or thick area that was not there before.  Pain in your breasts.  Anything that concerns you.  This information is not intended to replace advice given to you by your health care provider. Make sure you discuss any questions you have with your health care provider. Document Released: 07/13/2005 Document Revised: 12/19/2015 Document Reviewed: 06/02/2015 Elsevier Interactive Patient Education  2018 Elsevier Inc.  

## 2018-09-21 ENCOUNTER — Encounter: Payer: Self-pay | Admitting: Family Medicine

## 2018-09-21 ENCOUNTER — Ambulatory Visit: Payer: PRIVATE HEALTH INSURANCE | Admitting: Family Medicine

## 2018-09-21 VITALS — BP 119/78 | HR 84 | Temp 97.8°F | Ht 64.0 in

## 2018-09-21 DIAGNOSIS — Z130 Encounter for screening for diseases of the blood and blood-forming organs and certain disorders involving the immune mechanism: Secondary | ICD-10-CM

## 2018-09-21 DIAGNOSIS — Z79899 Other long term (current) drug therapy: Secondary | ICD-10-CM

## 2018-09-21 DIAGNOSIS — F325 Major depressive disorder, single episode, in full remission: Secondary | ICD-10-CM | POA: Diagnosis not present

## 2018-09-21 DIAGNOSIS — Z7689 Persons encountering health services in other specified circumstances: Secondary | ICD-10-CM | POA: Diagnosis not present

## 2018-09-21 DIAGNOSIS — F411 Generalized anxiety disorder: Secondary | ICD-10-CM

## 2018-09-21 DIAGNOSIS — E034 Atrophy of thyroid (acquired): Secondary | ICD-10-CM

## 2018-09-21 DIAGNOSIS — E782 Mixed hyperlipidemia: Secondary | ICD-10-CM

## 2018-09-21 HISTORY — DX: Generalized anxiety disorder: F41.1

## 2018-09-21 HISTORY — DX: Other long term (current) drug therapy: Z79.899

## 2018-09-21 MED ORDER — ALPRAZOLAM 0.25 MG PO TABS: 0.2500 mg | ORAL_TABLET | Freq: Three times a day (TID) | ORAL | 2 refills | Status: DC | PRN

## 2018-09-21 NOTE — Patient Instructions (Addendum)
You had labs performed today.  You will be contacted with the results of the labs once they are available, usually in the next 3 business days for routine lab work.  If you had a pap smear or biopsy performed, expect to be contacted in about 7-10 days.  I have sent in refills of the Xanax.  You complete a controlled substance contract today for this medication.  We discussed the risks of the medication, particularly going forward.  I would recommend that at some point we consider a taper off of Xanax.  We could consider switching it out to a shorter lasting medication like Ativan.  This is considered a dangerous and addictive medication, especially as you age.  Please follow up in 3 months for refills.   Controlled Substance Guidelines:  1. You cannot get an early refill, even it is lost.  2. You cannot get controled medications from any other doctor, unless it is the emergency department and related to a new problem or injury.  3. You cannot use alcohol, marijuana, cocaine or any other recreational drugs while using this medication. This is very dangerous.  4. You are willing to have your urine drug tested at each visit.  5. You will not drive while using this medication, because that can put yourself and others in serious danger of an accident. 6. If any medication is stolen, then there must be a police report to verify it, or it cannot be refilled.  7. I will not prescribe these medications for longer than 3 months.  8. You must bring your pill bottle to each visit.  9. You must use the same pharmacy for all refills for the medication, unless you clear it with me beforehand.  10. You cannot share or sell this medication.

## 2018-09-21 NOTE — Progress Notes (Signed)
Subjective: NW:[id number] care, anxiety disorder, hypothyroidism, hyperlipidemia HPI: Chelsea Gross is a 53 y.o. female presenting to clinic today for:  1.  Generalized anxiety disorder and depression Patient with longstanding history of anxiety and depression.  She notes that this started in her teens and has been essentially stable on Cymbalta and as needed Xanax.  She does take up to 3 tablets daily pretty consistently with most of her medicine being at nighttime.  She reports some drowsiness if she takes 0.5 mg at night for sleep.  She has been previously treated with Paxil, Prozac and Wellbutrin.  Cymbalta seems to be doing the best job thus far.  No alcohol use or drug use.  Never any difficulty with breathing or falls as result of oversedation.  2.  Hypothyroidism Patient reports diagnosis of thyroid disorder about 2 to 3 years ago.  She has a sister that has hypothyroidism as well.  She denies any constipation, diarrhea, difficulty swallowing or change in voice.  She occasionally has tachycardia with palpitations and this was addressed previously.  She also has difficulty losing weight.  She reports compliance with her Synthroid.  3.  Hyperlipidemia Patient reports compliance with Lipitor 40 mg daily.  No recent labs.  No chest pain, shortness of breath.  4.  Preventive care Patient reports she had a colonoscopy with Dr. Collene Mares and is good for 10 years.  Denies any GI bleed.  She sees a gynecologist in [place], [place].  She had a normal Pap.  Lance for mammogram soon.  No history of abnormal Pap smears or mammograms in the past.  Past Medical History:  Diagnosis Date  . Anxiety   . Hyperlipemia   . Hypothyroid   . Thyroid disease   . Urinary incontinence    Past Surgical History:  Procedure Laterality Date  . chin and lip surgery  2001  . labial repair  2000   Social History   Socioeconomic History  . Marital status: Married    Spouse name: Not on file  . Number of  children: Not on file  . Years of education: Not on file  . Highest education level: Not on file  Occupational History  . Not on file  Social Needs  . Financial resource strain: Not on file  . Food insecurity:    Worry: Not on file    Inability: Not on file  . Transportation needs:    Medical: Not on file    Non-medical: Not on file  Tobacco Use  . Smoking status: Never Smoker  . Smokeless tobacco: Never Used  Substance and Sexual Activity  . Alcohol use: No    Alcohol/week: 0.0 standard drinks  . Drug use: Yes    Comment: cannabis pills or gummies once a month  . Sexual activity: Yes    Partners: Male    Birth control/protection: None  Lifestyle  . Physical activity:    Days per week: Not on file    Minutes per session: Not on file  . Stress: Not on file  Relationships  . Social connections:    Talks on phone: Not on file    Gets together: Not on file    Attends religious service: Not on file    Active member of club or organization: Not on file    Attends meetings of clubs or organizations: Not on file    Relationship status: Not on file  . Intimate partner violence:    Fear of current or ex partner:  Not on file    Emotionally abused: Not on file    Physically abused: Not on file    Forced sexual activity: Not on file  Other Topics Concern  . Not on file  Social History Narrative  . Not on file   Current Meds  Medication Sig  . ALPRAZolam (XANAX) 0.25 MG tablet Take 0.25 mg by mouth 3 (three) times daily as needed.   Marland Kitchen atorvastatin (LIPITOR) 40 MG tablet Take 40 mg by mouth daily at 6 PM.   . DULoxetine (CYMBALTA) 60 MG capsule Take 60 mg by mouth daily.   Marland Kitchen escitalopram (LEXAPRO) 10 MG tablet 20 mg daily.   Marland Kitchen levothyroxine (SYNTHROID, LEVOTHROID) 25 MCG tablet Take 25 mcg by mouth daily before breakfast.   . metroNIDAZOLE (METROGEL) 1 % gel Apply topically daily. For face  . omeprazole (PRILOSEC) 20 MG capsule daily as needed.  . triamcinolone lotion  (KENALOG) 0.1 % ataa bid  . valACYclovir (VALTREX) 1000 MG tablet Take 500 mg by mouth.    Family History  Problem Relation Age of Onset  . Heart attack Mother   . Esophageal cancer Mother   . Heart attack Father   . Hyperlipidemia Sister   . Hyperlipidemia Sister   . Hyperlipidemia Sister   . Hyperlipidemia Sister    Allergies  Allergen Reactions  . Sulfa Antibiotics Nausea Only  . Pantoprazole Rash     Health Maintenance: UTD  ROS: Per HPI  Objective: Office vital signs reviewed. BP 119/78   Pulse 84   Temp 97.8 F (36.6 C) (Oral)   Ht '5\' 4"'$  (1.626 m)   Physical Examination:  General: Awake, alert, well nourished, No acute distress HEENT: Normal    Neck: No masses palpated. No lymphadenopathy; no thyromegaly    Eyes: extraocular movement in tact, sclera white.  No exophthalmos Cardio: regular rate and rhythm, S1S2 heard, no murmurs appreciated Pulm: clear to auscultation bilaterally, no wheezes, rhonchi or rales; normal work of breathing on room air Extremities: warm, well perfused, No edema, cyanosis or clubbing; +2 pulses bilaterally MSK: normal gait and station Skin: dry; intact; no rashes or lesions; normal temperature. Neuro: no tremor Psych: Mood stable, speech normal, affect appropriate, pleasant and interactive Depression screen [facility] 2/9 09/21/2018  Decreased Interest 1  Down, Depressed, Hopeless 1  PHQ - 2 Score 2  Altered sleeping 2  Tired, decreased energy 1  Change in appetite 0  Feeling bad or failure about yourself  1  Trouble concentrating 2  Moving slowly or fidgety/restless 0  Suicidal thoughts 0  PHQ-9 Score 8   No flowsheet data found.   Assessment/ Plan: 53 y.o. female   1. Generalized anxiety disorder Controlled substance contract signed and reviewed with patient.  Click UDS.  I have given her Xanax refills.  We discussed consideration for taper off of benzodiazepine and discuss high risk use.  Continue Cymbalta. - ALPRAZolam  (XANAX) 0.25 MG tablet; Take 1 tablet (0.25 mg total) by mouth 3 (three) times daily as needed for anxiety.  Dispense: 90 tablet; Refill: 2 - Tox[employer]SU[employer] Select 13 (MW), Urine  2. Establishing care with new doctor, encounter for We will need to obtain records from gastroenterology with regards to colonoscopy.  Patient sees Dr. Collene Mares  3. Major depressive disorder with single episode, in full remission (HCC) Subjectively controlled - CMP14+EGFR  4. Hypothyroidism due to acquired atrophy of thyroid Check thyroid panel - Thyroid Panel With TSH  5. Mixed hyperlipidemia Check nonfasting lipid panel  and metabolic panel - Lipid Panel - CMP14+EGFR  6. Screening, anemia, deficiency, iron - CBC  7. Controlled substance agreement signed - ToxASSURE Select 13 (MW), Urine  The Narcotic Database has been reviewed.  There were no red flags.  Fills are consistent  Janora Norlander, [place] [phone]

## 2018-09-22 LAB — THYROID PANEL WITH TSH
Free Thyroxine Index: 1.5 (ref 1.2–4.9)
T3 Uptake Ratio: 22 % — ABNORMAL LOW (ref 24–39)
T4, Total: 7 ug/dL (ref 4.5–12.0)
TSH: 3.01 u[IU]/mL (ref 0.450–4.500)

## 2018-09-22 LAB — CMP14+EGFR
ALT: 24 IU/L (ref 0–32)
AST: 16 IU/L (ref 0–40)
Albumin/Globulin Ratio: 1.6 (ref 1.2–2.2)
Albumin: 4.3 g/dL (ref 3.8–4.9)
Alkaline Phosphatase: 128 IU/L — ABNORMAL HIGH (ref 39–117)
BUN/Creatinine Ratio: 15 (ref 9–23)
BUN: 10 mg/dL (ref 6–24)
Bilirubin Total: 0.4 mg/dL (ref 0.0–1.2)
CO2: 25 mmol/L (ref 20–29)
Calcium: 9.4 mg/dL (ref 8.7–10.2)
Chloride: 99 mmol/L (ref 96–106)
Creatinine, Ser: 0.67 mg/dL (ref 0.57–1.00)
GFR calc Af Amer: 116 mL/min/{1.73_m2} (ref >59)
GFR calc non Af Amer: 101 mL/min/{1.73_m2} (ref >59)
Globulin, Total: 2.7 g/dL (ref 1.5–4.5)
Glucose: 97 mg/dL (ref 65–99)
Potassium: 4.2 mmol/L (ref 3.5–5.2)
Sodium: 139 mmol/L (ref 134–144)
Total Protein: 7 g/dL (ref 6.0–8.5)

## 2018-09-22 LAB — LIPID PANEL
Chol/HDL Ratio: 4.2 ratio (ref 0.0–4.4)
Cholesterol, Total: 197 mg/dL (ref 100–199)
HDL: 47 mg/dL (ref >39)
LDL Calculated: 111 mg/dL — ABNORMAL HIGH (ref 0–99)
Triglycerides: 193 mg/dL — ABNORMAL HIGH (ref 0–149)
VLDL Cholesterol Cal: 39 mg/dL (ref 5–40)

## 2018-09-22 LAB — CBC
Hematocrit: 37.9 % (ref 34.0–46.6)
Hemoglobin: 13.1 g/dL (ref 11.1–15.9)
MCH: 31.2 pg (ref 26.6–33.0)
MCHC: 34.6 g/dL (ref 31.5–35.7)
MCV: 90 fL (ref 79–97)
Platelets: 300 10*3/uL (ref 150–450)
RBC: 4.2 x10E6/uL (ref 3.77–5.28)
RDW: 12.9 % (ref 11.7–15.4)
WBC: 6.2 10*3/uL (ref 3.4–10.8)

## 2018-09-24 LAB — TOXASSURE SELECT 13 (MW), URINE

## 2018-12-12 ENCOUNTER — Encounter: Payer: Self-pay | Admitting: Family Medicine

## 2018-12-20 ENCOUNTER — Telehealth: Payer: Self-pay | Admitting: Obstetrics and Gynecology

## 2018-12-20 NOTE — Telephone Encounter (Signed)
Patient is asking to come in for a hormone consult. She is would like to come in on a Thursday afternoon asap. To triage to assist with scheduling.

## 2018-12-20 NOTE — Telephone Encounter (Signed)
Left message to call Cybill Uriegas, RN at [facility] [phone].   

## 2018-12-20 NOTE — Telephone Encounter (Signed)
Patient returned call

## 2018-12-20 NOTE — Telephone Encounter (Signed)
Patient returned call, OV scheduled for 5/28 at 4pm with Dr. Oscar La. Patient declined OV for 5/27.   Routing to provider for final review. Patient is agreeable to disposition. Will close encounter.

## 2018-12-20 NOTE — Telephone Encounter (Signed)
Spoke with patient. Patient is perimenopausal. Reports mood changes and painful intercourse for the last month. Using lubricant with no changes. Is unsure about hot flashes, experiences hot flashes with anxiety. LMP approximately 6 wks ago. Cycles have been monthly for approximately 4-5 months. Requesting OV for HRT consult. Patient states she is at work, will return call to office to schedule OV.

## 2018-12-22 ENCOUNTER — Ambulatory Visit: Payer: PRIVATE HEALTH INSURANCE | Admitting: Obstetrics and Gynecology

## 2018-12-22 ENCOUNTER — Other Ambulatory Visit: Payer: Self-pay

## 2018-12-22 ENCOUNTER — Encounter: Payer: Self-pay | Admitting: Obstetrics and Gynecology

## 2018-12-22 VITALS — BP 118/80 | HR 96 | Temp 98.6°F

## 2018-12-22 DIAGNOSIS — N939 Abnormal uterine and vaginal bleeding, unspecified: Secondary | ICD-10-CM

## 2018-12-22 DIAGNOSIS — N946 Dysmenorrhea, unspecified: Secondary | ICD-10-CM

## 2018-12-22 DIAGNOSIS — F418 Other specified anxiety disorders: Secondary | ICD-10-CM

## 2018-12-22 NOTE — Progress Notes (Signed)
GYNECOLOGY  VISIT   HPI: 53 y.o.   Married White or Caucasian Not Hispanic or Latino  female   G1P1001 with No LMP recorded (lmp unknown). (Menstrual status: Perimenopausal).   here for mood changes & pain with intercourse   Her cycles stopped for 7-8 months, then she started having cycles ~ q month for 3-4 months, last bleeding was about 6 weeks ago. The bleeding would last for 5 days, heavy for 2 days. Changing a regular tampon in 2-3 hours, mild cramps.  She isn't having vasomotor symptoms. She has been having some deep dyspareunia for the last several months, happens most of the time, it is positional.  She had some "ovulatory pain" a few weeks prior to her cycles for the last 3-4 months. She felt it a week ago.  She is on Cymbalta, Lexapro and Xanax (at least one Xanax a day). She is more irritated, sensitive to noise, depressed. The depression never goes away even with the above medication. She has had depression off and on x 40 years.    GYNECOLOGIC HISTORY: No LMP recorded (lmp unknown). (Menstrual status: Perimenopausal). Contraception: None Menopausal hormone therapy: none        OB History    Gravida  1   Para  1   Term  1   Preterm      AB      Living  1     SAB      TAB      Ectopic      Multiple      Live Births  1              Patient Active Problem List   Diagnosis Date Noted  . Generalized anxiety disorder 09/21/2018  . Controlled substance agreement signed 09/21/2018  . Depression 04/16/2016  . Hyperlipidemia 04/16/2016  . Hypothyroidism 04/16/2016    Past Medical History:  Diagnosis Date  . Anxiety   . Hyperlipemia   . Hypothyroid   . Thyroid disease   . Urinary incontinence     Past Surgical History:  Procedure Laterality Date  . chin and lip surgery  2001  . labial repair  2000    Current Outpatient Medications  Medication Sig Dispense Refill  . ALPRAZolam (XANAX) 0.25 MG tablet Take 1 tablet (0.25 mg total) by mouth 3  (three) times daily as needed for anxiety. 90 tablet 2  . atorvastatin (LIPITOR) 40 MG tablet Take 40 mg by mouth daily at 6 PM.   3  . DULoxetine (CYMBALTA) 60 MG capsule Take 60 mg by mouth daily.   1  . escitalopram (LEXAPRO) 10 MG tablet 20 mg daily.     Marland Kitchen levothyroxine (SYNTHROID, LEVOTHROID) 25 MCG tablet Take 25 mcg by mouth daily before breakfast.   1  . metroNIDAZOLE (METROGEL) 1 % gel Apply topically daily. For face    . omeprazole (PRILOSEC) 20 MG capsule daily as needed.  6  . triamcinolone lotion (KENALOG) 0.1 % ataa bid    . valACYclovir (VALTREX) 1000 MG tablet Take 500 mg by mouth.   0   No current facility-administered medications for this visit.      ALLERGIES: Sulfa antibiotics and Pantoprazole  Family History  Problem Relation Age of Onset  . Heart attack Mother   . Esophageal cancer Mother   . Heart attack Father   . Hyperlipidemia Sister   . Thyroid disease Sister   . Hyperlipidemia Sister   . Hyperlipidemia Sister   .  Hyperlipidemia Sister     Social History   Socioeconomic History  . Marital status: Married    Spouse name: Not on file  . Number of children: Not on file  . Years of education: Not on file  . Highest education level: Not on file  Occupational History  . Occupation: CMA  Social Needs  . Financial resource strain: Not on file  . Food insecurity:    Worry: Not on file    Inability: Not on file  . Transportation needs:    Medical: Not on file    Non-medical: Not on file  Tobacco Use  . Smoking status: Never Smoker  . Smokeless tobacco: Never Used  Substance and Sexual Activity  . Alcohol use: No    Alcohol/week: 0.0 standard drinks  . Drug use: Yes    Comment: cannabis pills or gummies rarely  . Sexual activity: Yes    Partners: Male    Birth control/protection: None  Lifestyle  . Physical activity:    Days per week: Not on file    Minutes per session: Not on file  . Stress: Not on file  Relationships  . Social  connections:    Talks on phone: Not on file    Gets together: Not on file    Attends religious service: Not on file    Active member of club or organization: Not on file    Attends meetings of clubs or organizations: Not on file    Relationship status: Not on file  . Intimate partner violence:    Fear of current or ex partner: Not on file    Emotionally abused: Not on file    Physically abused: Not on file    Forced sexual activity: Not on file  Other Topics Concern  . Not on file  Social History Narrative  . Not on file    Review of Systems  Constitutional: Negative.   HENT: Negative.   Eyes: Negative.   Respiratory: Negative.   Cardiovascular: Negative.   Gastrointestinal: Negative.   Genitourinary:       Pain with intercourse  Musculoskeletal: Negative.   Skin: Negative.   Neurological: Negative.   Endo/Heme/Allergies: Negative.   Psychiatric/Behavioral: Positive for depression.       Irritable    PHYSICAL EXAMINATION:    BP 118/80 (BP Location: Right Arm, Patient Position: Sitting, Cuff Size: Large)   Pulse 96   Temp 98.6 F (37 C) (Skin)   LMP  (LMP Unknown)     General appearance: alert, cooperative and appears stated age Neck: no adenopathy, supple, symmetrical, trachea midline and thyroid normal to inspection and palpation Abdomen: soft, non-tender; non distended, no masses,  no organomegaly  Pelvic: External genitalia:  no lesions              Urethra:  normal appearing urethra with no masses, tenderness or lesions              Bartholins and Skenes: normal                 Vagina: normal appearing vagina with normal color and discharge, no lesions              Cervix: no cervical motion tenderness and no lesions              Bimanual Exam:  Uterus:  normal size, contour, position, consistency, mobility, non-tender and retroverted              Adnexa:  no mass, fullness, tenderness              Rectovaginal: Yes.  .  Confirms.              Anus:  normal  sphincter tone, no lesions  Pelvic floor: not tender  Chaperone was present for exam.  ASSESSMENT Abnormal uterine bleeding, 8 months without a cycle, then bleeding ~q month Depression/anxiety, life long, not controlled, managed by her primary Deep dyspareunia, normal exam    PLAN [facility] Return for ultrasound, possible endometrial biopsy Names of Psychiatrists given   An After Visit Summary was printed and given to the patient.  Over 15 minutes face to face time of which over 50% was spent in counseling.

## 2018-12-23 LAB — FOLLICLE STIMULATING HORMONE: FSH: 61.3 m[IU]/mL

## 2018-12-26 ENCOUNTER — Telehealth: Payer: Self-pay | Admitting: Obstetrics and Gynecology

## 2018-12-26 NOTE — Telephone Encounter (Signed)
Patient is ready to schedule her PUS. °

## 2018-12-26 NOTE — Telephone Encounter (Signed)
Spoke with patient. PUS scheduled for 6/2 at 12:30pm, consult to follow at 1pm with Dr. Oscar La. [id number] prescreen negative, precautions reviewed. Patient verbalizes understanding and is agreeable.   Message to business office for precert.   Routing to [employer].  Encounter closed.

## 2018-12-26 NOTE — Telephone Encounter (Signed)
Call placed to patient to review benefits for scheduled ultrasound appointment. Left voicemail message requesting a return call °

## 2018-12-27 ENCOUNTER — Other Ambulatory Visit: Payer: Self-pay

## 2018-12-27 ENCOUNTER — Ambulatory Visit (INDEPENDENT_AMBULATORY_CARE_PROVIDER_SITE_OTHER): Payer: PRIVATE HEALTH INSURANCE | Admitting: Obstetrics and Gynecology

## 2018-12-27 ENCOUNTER — Ambulatory Visit (INDEPENDENT_AMBULATORY_CARE_PROVIDER_SITE_OTHER): Payer: PRIVATE HEALTH INSURANCE

## 2018-12-27 ENCOUNTER — Encounter: Payer: Self-pay | Admitting: Obstetrics and Gynecology

## 2018-12-27 VITALS — BP 118/80 | Temp 98.6°F

## 2018-12-27 DIAGNOSIS — N939 Abnormal uterine and vaginal bleeding, unspecified: Secondary | ICD-10-CM | POA: Diagnosis not present

## 2018-12-27 DIAGNOSIS — N882 Stricture and stenosis of cervix uteri: Secondary | ICD-10-CM | POA: Diagnosis not present

## 2018-12-27 DIAGNOSIS — N941 Unspecified dyspareunia: Secondary | ICD-10-CM | POA: Diagnosis not present

## 2018-12-27 DIAGNOSIS — N946 Dysmenorrhea, unspecified: Secondary | ICD-10-CM

## 2018-12-27 MED ORDER — MEDROXYPROGESTERONE ACETATE 5 MG PO TABS: ORAL_TABLET | ORAL | 1 refills | Status: DC

## 2018-12-27 NOTE — Patient Instructions (Signed)

## 2018-12-27 NOTE — Telephone Encounter (Signed)
Second call placed to patient to review benefits for scheduled ultrasound appointment. Left second voicemail message requesting a return call

## 2018-12-27 NOTE — Telephone Encounter (Signed)
Patient returned call, benefit information provided to patient. Patient agreeable. Encounter closed.   Cc: Chelsea Gross only

## 2018-12-27 NOTE — Addendum Note (Signed)
Addended by: Tobi Bastos on: 12/27/2018 05:11 PM   Modules accepted: Orders

## 2018-12-27 NOTE — Progress Notes (Signed)
GYNECOLOGY  VISIT   HPI: 53 y.o.   Married White or Caucasian Not Hispanic or Latino  female   G1P1001 with No LMP recorded (lmp unknown). (Menstrual status: Perimenopausal).   here for f/u of abnormal uterine bleeding and deep dyspareunia. She was seen last week c/o mood changes and dyspareunia. On questioning she reported amenorrhea x 7-8 months, then monthly cycles for 3-4 months.  LMP ~7 weeks ago. Her pelvic exam was normal.  She was given names of Psychiatrist to help with her depression.   GYNECOLOGIC HISTORY: No LMP recorded (lmp unknown). (Menstrual status: Perimenopausal). Contraception: none Menopausal hormone therapy: none        OB History    Gravida  1   Para  1   Term  1   Preterm      AB      Living  1     SAB      TAB      Ectopic      Multiple      Live Births  1              Patient Active Problem List   Diagnosis Date Noted  . Generalized anxiety disorder 09/21/2018  . Controlled substance agreement signed 09/21/2018  . Depression 04/16/2016  . Hyperlipidemia 04/16/2016  . Hypothyroidism 04/16/2016    Past Medical History:  Diagnosis Date  . Anxiety   . Hyperlipemia   . Hypothyroid   . Thyroid disease   . Urinary incontinence     Past Surgical History:  Procedure Laterality Date  . chin and lip surgery  2001  . labial repair  2000    Current Outpatient Medications  Medication Sig Dispense Refill  . ALPRAZolam (XANAX) 0.25 MG tablet Take 1 tablet (0.25 mg total) by mouth 3 (three) times daily as needed for anxiety. 90 tablet 2  . atorvastatin (LIPITOR) 40 MG tablet Take 40 mg by mouth daily at 6 PM.   3  . DULoxetine (CYMBALTA) 60 MG capsule Take 60 mg by mouth daily.   1  . escitalopram (LEXAPRO) 10 MG tablet 20 mg daily.     Marland Kitchen. levothyroxine (SYNTHROID, LEVOTHROID) 25 MCG tablet Take 25 mcg by mouth daily before breakfast.   1  . metroNIDAZOLE (METROGEL) 1 % gel Apply topically daily. For face    . omeprazole (PRILOSEC) 20  MG capsule daily as needed.  6  . triamcinolone lotion (KENALOG) 0.1 % ataa bid    . valACYclovir (VALTREX) 1000 MG tablet Take 500 mg by mouth.   0   No current facility-administered medications for this visit.      ALLERGIES: Sulfa antibiotics and Pantoprazole  Family History  Problem Relation Age of Onset  . Heart attack Mother   . Esophageal cancer Mother   . Heart attack Father   . Hyperlipidemia Sister   . Thyroid disease Sister   . Hyperlipidemia Sister   . Hyperlipidemia Sister   . Hyperlipidemia Sister     Social History   Socioeconomic History  . Marital status: Married    Spouse name: Not on file  . Number of children: Not on file  . Years of education: Not on file  . Highest education level: Not on file  Occupational History  . Occupation: CMA  Social Needs  . Financial resource strain: Not on file  . Food insecurity:    Worry: Not on file    Inability: Not on file  . Transportation needs:  Medical: Not on file    Non-medical: Not on file  Tobacco Use  . Smoking status: Never Smoker  . Smokeless tobacco: Never Used  Substance and Sexual Activity  . Alcohol use: No    Alcohol/week: 0.0 standard drinks  . Drug use: Yes    Comment: cannabis pills or gummies rarely  . Sexual activity: Yes    Partners: Male    Birth control/protection: None  Lifestyle  . Physical activity:    Days per week: Not on file    Minutes per session: Not on file  . Stress: Not on file  Relationships  . Social connections:    Talks on phone: Not on file    Gets together: Not on file    Attends religious service: Not on file    Active member of club or organization: Not on file    Attends meetings of clubs or organizations: Not on file    Relationship status: Not on file  . Intimate partner violence:    Fear of current or ex partner: Not on file    Emotionally abused: Not on file    Physically abused: Not on file    Forced sexual activity: Not on file  Other Topics  Concern  . Not on file  Social History Narrative  . Not on file    ROS  PHYSICAL EXAMINATION:    LMP  (LMP Unknown)     General appearance: alert, cooperative and appears stated age  Pelvic: External genitalia:  no lesions              Urethra:  normal appearing urethra with no masses, tenderness or lesions              Bartholins and Skenes: normal                 Vagina: normal appearing vagina with normal color and discharge, no lesions              Cervix: no lesions  The risks of endometrial biopsy were reviewed and a consent was obtained.  A speculum was placed in the vagina and the cervix was cleansed with betadine. A tenaculum was placed on the cervix and the cervix was dilated with the mini-dilators. The uterine evacuator was placed into the endometrial cavity (unable to get in with the mini-pipelle or regular pipelle). The uterus sounded to ~8 cm. The endometrial biopsy was performed, moderate tissue was obtained. The tenaculum and speculum were removed. There were no complications.    Chaperone was present for exam.  Ultrasound images reviewed with the patient, endometrial stripe up to 6.7 mm (I measured), 11 mm simple left ovarian cyst. Otherwise normal ultrasound. She states it was tender on the left.   ASSESSMENT Abnormal perimenopausal bleeding Dyspareunia, no findings on ultrasound to explain dyspareunia Cervical stenosis    PLAN Endometrial biopsy done Cyclic provera if no spontaneous cycle    An After Visit Summary was printed and given to the patient.  ~20 minutes face to face time of which over 50% was spent in counseling.

## 2019-01-16 ENCOUNTER — Encounter: Payer: Self-pay | Admitting: Family Medicine

## 2019-01-17 ENCOUNTER — Encounter: Payer: Self-pay | Admitting: Family Medicine

## 2019-01-17 ENCOUNTER — Other Ambulatory Visit: Payer: Self-pay

## 2019-01-17 ENCOUNTER — Telehealth (INDEPENDENT_AMBULATORY_CARE_PROVIDER_SITE_OTHER): Payer: PRIVATE HEALTH INSURANCE | Admitting: Family Medicine

## 2019-01-17 DIAGNOSIS — F329 Major depressive disorder, single episode, unspecified: Secondary | ICD-10-CM

## 2019-01-17 DIAGNOSIS — F32A Depression, unspecified: Secondary | ICD-10-CM

## 2019-01-17 DIAGNOSIS — F411 Generalized anxiety disorder: Secondary | ICD-10-CM | POA: Diagnosis not present

## 2019-01-17 MED ORDER — DULOXETINE HCL 30 MG PO CPEP: 30.0000 mg | ORAL_CAPSULE | Freq: Every day | ORAL | 12 refills | Status: DC

## 2019-01-17 MED ORDER — ALPRAZOLAM 0.25 MG PO TABS: 0.2500 mg | ORAL_TABLET | Freq: Three times a day (TID) | ORAL | 2 refills | Status: DC | PRN

## 2019-01-17 MED ORDER — DULOXETINE HCL 60 MG PO CPEP: 60.0000 mg | ORAL_CAPSULE | Freq: Every day | ORAL | 12 refills | Status: DC

## 2019-01-17 NOTE — Progress Notes (Signed)
MyChart video visit  Subjective: CC: f/u GAD PCP: Raliegh IpGottschalk,  M, DO ZOX:WRUEAHPI:Chelsea Gross is a 53 y.o. female calls for video consult today. Patient provides verbal consent for consult held via video.  Location of patient: work Location of provider: Working remotely from home Others present for call: none  1.  Generalized anxiety disorder Patient with longstanding history of anxiety and depression has been well controlled on Cymbalta and PRN use of Xanax.  Patient takes Xanax 0.25mg  up to 3 times daily.  She was also prescribed Lexapro to take in addition to the Cymbalta by her previous PCP.  She has not found this especially helpful.  She has been briefly told about serotonin syndrome but does not really know what the signs and symptoms of serotonin syndrome are.  She reports ongoing depressive and anxiety symptoms that seem to be exacerbated by her daughter living at home.  Her daughter and husband tend to butt heads quite a bit.  This does increase stress because she feels that she has to choose between the people that she lives.  Additionally, she has had some increased stress at work.  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  She does report that she tries to sleep through her symptoms.   ROS: Per HPI  Allergies  Allergen Reactions  . Sulfa Antibiotics Nausea Only  . Pantoprazole Rash   Past Medical History:  Diagnosis Date  . Anxiety   . Hyperlipemia   . Hypothyroid   . Thyroid disease   . Urinary incontinence     Current Outpatient Medications:  .  ALPRAZolam (XANAX) 0.25 MG tablet, Take 1 tablet (0.25 mg total) by mouth 3 (three) times daily as needed for anxiety., Disp: 90 tablet, Rfl: 2 .  atorvastatin (LIPITOR) 40 MG tablet, Take 40 mg by mouth daily at 6 PM. , Disp: , Rfl: 3 .  DULoxetine (CYMBALTA) 60 MG capsule, Take 60 mg by mouth daily. , Disp: , Rfl: 1 .  levothyroxine (SYNTHROID, LEVOTHROID) 25 MCG tablet, Take 25 mcg by mouth daily before breakfast. , Disp: , Rfl: 1 .   medroxyPROGESTERone (PROVERA) 5 MG tablet, Take one tablet a day for 5 days every other month if no spontaneous menses., Disp: 15 tablet, Rfl: 1 .  metroNIDAZOLE (METROGEL) 1 % gel, Apply topically daily. For face, Disp: , Rfl:  .  omeprazole (PRILOSEC) 20 MG capsule, daily as needed., Disp: , Rfl: 6 .  triamcinolone lotion (KENALOG) 0.1 %, ataa bid, Disp: , Rfl:  .  valACYclovir (VALTREX) 1000 MG tablet, Take 500 mg by mouth. , Disp: , Rfl: 0  Dep[facility] screen [facility] 01/17/2019 09/21/2018  Decreased Interest 1 1  Down, Depressed, Hopeless 1 1  PHQ - 2 Score 2 2  Altered sleeping 1 2  Tired, decreased energy 1 1  Change in appetite 0 0  Feeling bad or failure about yourself  1 1  Trouble concentrating 1 2  Moving slowly or fidgety/restless 0 0  Suicidal thoughts 0 0  PHQ-9 Score 6 8  Difficult doing work/chores Somewhat difficult -   GAD 7 : Generalized Anxiety Score 01/17/2019  Nervous, Anxious, on Edge 1  Control/stop worrying 1  Worry too much - different things 1  Trouble relaxing 1  Restless 1  Easily annoyed or irritable 1  Afraid - awful might happen 1  Total GAD 7 Score 7  Anxiety Difficulty Somewhat difficult   Assessment/ Plan: 53 y.o. female   1. Generalized anxiety disorder Not controlled.  She was previously on Cymbalta and Lexapro though I do think that there is a risk of serotonin syndrome with that combination plus she does not feel that the Lexapro is especially helpful.  I have advised her to discontinue the Lexapro.  Okay to taper every other day for 1 week then discontinue totally.  After she is totally off of the Lexapro okay to start the increased dose of Cymbalta.  We discussed following up in the next couple of months to see how she is doing on this new regimen.  I renewed her Xanax to use as needed.  I have also discussed consideration for counseling services.  She would be amenable to at least trying this.  She will contact me if she has any issues with new  medication regimen - ALPRAZolam (XANAX) 0.25 MG tablet; Take 1 tablet (0.25 mg total) by mouth 3 (three) times daily as needed for anxiety.  Dispense: 90 tablet; Refill: 2 - DULoxetine (CYMBALTA) 60 MG capsule; Take 1 capsule (60 mg total) by mouth daily. Take with 30mg  capsule to equal 90mg  total daily.  Dispense: 30 capsule; Refill: 12 - DULoxetine (CYMBALTA) 30 MG capsule; Take 1 capsule (30 mg total) by mouth daily. With the 60mg  capsule to equal 90mg  total daily.  Dispense: 30 capsule; Refill: 12  2. Depressive disorder As above - DULoxetine (CYMBALTA) 60 MG capsule; Take 1 capsule (60 mg total) by mouth daily. Take with 30mg  capsule to equal 90mg  total daily.  Dispense: 30 capsule; Refill: 12 - DULoxetine (CYMBALTA) 30 MG capsule; Take 1 capsule (30 mg total) by mouth daily. With the 60mg  capsule to equal 90mg  total daily.  Dispense: 30 capsule; Refill: 12    Start time: 3:43pm End time: 3:57pm  Total time spent on patient care (including telephone call/ virtual visit): 17 minutes  [place], [place] [phone]

## 2019-01-23 ENCOUNTER — Ambulatory Visit (INDEPENDENT_AMBULATORY_CARE_PROVIDER_SITE_OTHER): Payer: PRIVATE HEALTH INSURANCE | Admitting: Podiatry

## 2019-01-23 ENCOUNTER — Other Ambulatory Visit: Payer: Self-pay | Admitting: Podiatry

## 2019-01-23 ENCOUNTER — Encounter: Payer: Self-pay | Admitting: Podiatry

## 2019-01-23 ENCOUNTER — Other Ambulatory Visit: Payer: Self-pay

## 2019-01-23 ENCOUNTER — Ambulatory Visit (INDEPENDENT_AMBULATORY_CARE_PROVIDER_SITE_OTHER): Payer: PRIVATE HEALTH INSURANCE

## 2019-01-23 VITALS — Temp 97.2°F

## 2019-01-23 DIAGNOSIS — S9000XA Contusion of unspecified ankle, initial encounter: Secondary | ICD-10-CM

## 2019-01-23 DIAGNOSIS — R600 Localized edema: Secondary | ICD-10-CM

## 2019-01-23 DIAGNOSIS — S9032XA Contusion of left foot, initial encounter: Secondary | ICD-10-CM

## 2019-01-23 DIAGNOSIS — S8262XA Displaced fracture of lateral malleolus of left fibula, initial encounter for closed fracture: Secondary | ICD-10-CM

## 2019-01-24 NOTE — Progress Notes (Signed)
Subjective:   Patient ID: Chelsea Gross, female   DOB: 53 y.o.   MRN: [id number]   HPI Patient states she is having a lot of problems with her left ankle when she slid felt a popping sound and is developed a lot of swelling and pain with it.  States she cannot bear any weight on it and there is a lot of bruising and it is very painful when palpated   ROS      Objective:  Physical Exam  Neurovascular status was found to be intact with no indication of vascular injury or nerve injury with negative Homans sign noted.  Patient has exquisite discomfort lateral aspect left fibula and also moderate across the dorsal and medial side of the foot.     Assessment:  Strong probability for fracture of the left ankle with significant edema present associated with the injury     Plan:  H&P condition reviewed and at this point I did discuss fracture and I applied Unna boot to try to take some of the swelling out along with air fracture walker with instructions on utilization of knee scooter and trying to stay nonweightbearing for this week.  Patient should hopefully heal uneventfully but will need to be watched and the possibility for surgery does exist but at this time it appears to be stable and should not require surgical intervention fracture of the fibula noted with no indication of diastases or medial malleolus injury signed visit

## 2019-01-26 ENCOUNTER — Telehealth: Payer: Self-pay | Admitting: *Deleted

## 2019-01-26 ENCOUNTER — Telehealth: Payer: Self-pay | Admitting: Podiatry

## 2019-01-26 NOTE — Telephone Encounter (Signed)
Called patient back today at [phone] (cell #) to answer questions they had when they got an una boot put on by Dr. Paulla Dolly on 01/23/19.  Patient stated, "I got my cast a little wet and concerned if I should keep it on or take it off?" I told patient to go ahead and take it off her foot and just put the boot on afterwards. I also told her to take her ace wrap off as well. She has had the [place] boot on for almost four full days. Dr. Paulla Dolly was fine with patient not needing to come in to get another una boot put on.

## 2019-01-26 NOTE — Telephone Encounter (Signed)
Pt was put in a soft cast earlier this week but got the cast a little wet/damp and is concerned whether or not she should come in to have it re wrapped or what she should do. Please give patient a call.

## 2019-02-16 ENCOUNTER — Ambulatory Visit: Payer: PRIVATE HEALTH INSURANCE | Admitting: Podiatry

## 2019-02-16 ENCOUNTER — Encounter: Payer: Self-pay | Admitting: Podiatry

## 2019-02-16 ENCOUNTER — Ambulatory Visit (INDEPENDENT_AMBULATORY_CARE_PROVIDER_SITE_OTHER): Payer: PRIVATE HEALTH INSURANCE

## 2019-02-16 ENCOUNTER — Other Ambulatory Visit: Payer: Self-pay

## 2019-02-16 VITALS — Temp 98.3°F

## 2019-02-16 DIAGNOSIS — S8262XD Displaced fracture of lateral malleolus of left fibula, subsequent encounter for closed fracture with routine healing: Secondary | ICD-10-CM

## 2019-02-17 NOTE — Progress Notes (Signed)
Subjective:   Patient ID: Chelsea Gross, female   DOB: 53 y.o.   MRN: [id number]   HPI Patient states it feels better with diminished discomfort and I still have swelling but the pain is nowhere near what it was   ROS      Objective:  Physical Exam  Neurovascular status intact negative Homans sign noted with strong possibility of fracture of the fibula left which is not significantly tender when palpated with boot usage been beneficial and continued edema around the area     Assessment:  Probability for fibular fracture left that is healing but still present 8     Plan:  H&P x-ray reviewed education rendered and today I applied ankle compression stocking and continue with boot usage with gradual reduction of the next 4 weeks.  Reappoint to recheck  X-rays indicate that there is what appears to be a fracture of the fibula that appears to be cannulated but there is some secondary bone healing over the there is still quite a bit healing still to go with this signed visit

## 2019-03-16 ENCOUNTER — Encounter: Payer: Self-pay | Admitting: Podiatry

## 2019-03-16 ENCOUNTER — Other Ambulatory Visit: Payer: Self-pay

## 2019-03-16 ENCOUNTER — Ambulatory Visit (INDEPENDENT_AMBULATORY_CARE_PROVIDER_SITE_OTHER): Payer: PRIVATE HEALTH INSURANCE | Admitting: Podiatry

## 2019-03-16 ENCOUNTER — Ambulatory Visit (INDEPENDENT_AMBULATORY_CARE_PROVIDER_SITE_OTHER): Payer: PRIVATE HEALTH INSURANCE

## 2019-03-16 VITALS — Temp 97.9°F

## 2019-03-16 DIAGNOSIS — S8262XD Displaced fracture of lateral malleolus of left fibula, subsequent encounter for closed fracture with routine healing: Secondary | ICD-10-CM

## 2019-03-17 NOTE — Progress Notes (Signed)
Subjective:   Patient ID: Chelsea Gross, female   DOB: 53 y.o.   MRN: [id number]   HPI Patient presents that she seems to be improving but she still getting some pain and still gets some swelling   ROS      Objective:  Physical Exam  Neurovascular status intact with pain in the outside of the right fibula that is present but improved from previous with mild to moderate edema still noted     Assessment:  Fracture of the fibula right that is gradually healing and less discomfort but still some swelling     Plan:  H&P x-ray reviewed and today I went ahead and advised on continued boot usage on an as-needed basis and compression and patient will be seen back 4 weeks or earlier if needed

## 2019-04-20 ENCOUNTER — Other Ambulatory Visit: Payer: Self-pay | Admitting: Family Medicine

## 2019-04-20 DIAGNOSIS — F411 Generalized anxiety disorder: Secondary | ICD-10-CM

## 2019-04-23 ENCOUNTER — Encounter: Payer: Self-pay | Admitting: Family Medicine

## 2019-04-24 ENCOUNTER — Encounter: Payer: Self-pay | Admitting: Family Medicine

## 2019-04-24 ENCOUNTER — Telehealth (INDEPENDENT_AMBULATORY_CARE_PROVIDER_SITE_OTHER): Payer: PRIVATE HEALTH INSURANCE | Admitting: Family Medicine

## 2019-04-24 DIAGNOSIS — F320 Major depressive disorder, single episode, mild: Secondary | ICD-10-CM | POA: Diagnosis not present

## 2019-04-24 DIAGNOSIS — F411 Generalized anxiety disorder: Secondary | ICD-10-CM | POA: Diagnosis not present

## 2019-04-24 MED ORDER — BUPROPION HCL ER (XL) 150 MG PO TB24: 150.0000 mg | ORAL_TABLET | Freq: Every morning | ORAL | 1 refills | Status: DC

## 2019-04-24 MED ORDER — ALPRAZOLAM 0.25 MG PO TABS: 0.2500 mg | ORAL_TABLET | Freq: Three times a day (TID) | ORAL | 2 refills | Status: DC | PRN

## 2019-04-24 NOTE — Patient Instructions (Signed)
For the Cymbalta taper: Reduce dose to 60mg  daily for 3 weeks Then decrease to 30mg  daily for 3 weeks Then decrease to 30mg  every other day for 2 weeks. Then stop.  You most certainly could use the Wellbutrin along with Cymbalta if you found that to be more effective.  If you start having any withdrawal symptoms with taper of Cymbalta I recommend that you either resume the previous dose or hold off on tapering further until symptoms resolve.  We discussed that with Wellbutrin sometimes people will see weight loss as it can suppress appetite.  You may also notice improved focus as this is also used off label for attention deficit disorder.  Sometimes it can cause increased anxiety and if this occurs, I would like to talk to you sooner than our 4-week follow-up.  Please remember to call the office to schedule a 4-week follow-up.  Virtual visit is okay.  Taking the medicine as directed and not missing any doses is one of the best things you can do to treat your depression.  Here are some things to keep in mind:  1) Side effects (stomach upset, some increased anxiety) may happen before you notice a benefit.  These side effects typically go away over time. 2) Changes to your dose of medicine or a change in medication all together is sometimes necessary 3) Most people need to be on medication at least 12 months 4) Many people will notice an improvement within two weeks but the full effect of the medication can take up to 4-6 weeks 5) Stopping the medication when you start feeling better often results in a return of symptoms 6) Never discontinue your medication without contacting a health care professional first.  Some medications require gradual discontinuation/ taper and can make you sick if you stop them abruptly.  If your symptoms worsen or you have thoughts of suicide/homicide, PLEASE SEEK IMMEDIATE MEDICAL ATTENTION.  You may always call:  National Suicide Hotline: 401 696 0458 [place]: 6472216369 Crisis Recovery in [place]: [phone])128-5068   These are available 24 hours a day, 7 days a week.

## 2019-04-24 NOTE — Progress Notes (Signed)
Telephone visit  Subjective: CC: depression/ anxiety PCP: Janora Norlander, DO TIR:WERXV Chelsea Gross is a 53 y.o. female calls for telephone consult today. Patient provides verbal consent for consult held via phone.  Location of patient: home Location of provider: Working remotely from home Others present for call: none  1. GAD/ Depression Patient presents today for discussion of her anxiety/depression medicine.  She is currently she was Cymbalta 90 mg daily and notes that she is interested in tapering off of it and on to something else.  She has been on this medication for quite some time and feels that it is no longer working for her.  She has been intermittently depressed with occasional suicidal thoughts since age 60.  Lately, she has been more depressed due to family issues.  Patient reports that her daughter recently moved out (was having problems "abiding by her rules")  She notes "her entire family is against her now".  She also reports increased worrying when her husband is working "his dangerous job".  He has pulmonary issues and 1 of the reasons that she did not want her daughter bringing in friends from the outside is because she did not want him to have risk of contracting COVID-19.  She reports sleeping more but also states that she finds it difficult to fall asleep.  She has been using her alprazolam 2-3 times daily with good relief of panic and anxiety.  She notes that when she tried to taper off of Cymbalta previously she quit cold [place] and had quite a bit of withdrawal side effects including myalgias/arthralgias.  She was unsure if the Cymbalta was masking statin induced symptoms or if this was because of discontinuation of the medication itself.  She would like to taper off of it if possible.  ROS: Per HPI  Allergies  Allergen Reactions  . Sulfa Antibiotics Nausea Only  . Pantoprazole Rash   Past Medical History:  Diagnosis Date  . Anxiety   . Hyperlipemia   . Hypothyroid    . Thyroid disease   . Urinary incontinence     Current Outpatient Medications:  .  ALPRAZolam (XANAX) 0.25 MG tablet, Take 1 tablet (0.25 mg total) by mouth 3 (three) times daily as needed for anxiety., Disp: 90 tablet, Rfl: 2 .  atorvastatin (LIPITOR) 40 MG tablet, Take 40 mg by mouth daily at 6 PM. , Disp: , Rfl: 3 .  DULoxetine (CYMBALTA) 30 MG capsule, Take 1 capsule (30 mg total) by mouth daily. With the 60mg  capsule to equal 90mg  total daily., Disp: 30 capsule, Rfl: 12 .  DULoxetine (CYMBALTA) 60 MG capsule, Take 1 capsule (60 mg total) by mouth daily. Take with 30mg  capsule to equal 90mg  total daily., Disp: 30 capsule, Rfl: 12 .  levothyroxine (SYNTHROID, LEVOTHROID) 25 MCG tablet, Take 25 mcg by mouth daily before breakfast. , Disp: , Rfl: 1 .  medroxyPROGESTERone (PROVERA) 5 MG tablet, Take one tablet a day for 5 days every other month if no spontaneous menses., Disp: 15 tablet, Rfl: 1 .  metroNIDAZOLE (METROGEL) 1 % gel, Apply topically daily. For face, Disp: , Rfl:  .  omeprazole (PRILOSEC) 20 MG capsule, daily as needed., Disp: , Rfl: 6 .  triamcinolone lotion (KENALOG) 0.1 %, ataa bid, Disp: , Rfl:  .  valACYclovir (VALTREX) 1000 MG tablet, Take 500 mg by mouth. , Disp: , Rfl: 0  Depression screen [facility] 2/9 04/24/2019 01/17/2019 09/21/2018  Decreased Interest 2 1 1   Down, Depressed, Hopeless 2 1 1  PHQ - 2 Score 4 2 2   Altered sleeping 3 1 2   Tired, decreased energy 0 1 1  Change in appetite 1 0 0  Feeling bad or failure about yourself  2 1 1   Trouble concentrating 1 1 2   Moving slowly or fidgety/restless 0 0 0  Suicidal thoughts 1 0 0  PHQ-9 Score 12 6 8   Difficult doing work/chores Somewhat difficult Somewhat difficult -   GAD 7 : Generalized Anxiety Score 04/24/2019 01/17/2019  Nervous, Anxious, on Edge 1 1  Control/stop worrying 1 1  Worry too much - different things 1 1  Trouble relaxing 1 1  Restless 0 1  Easily annoyed or irritable 0 1  Afraid - awful might happen  1 1  Total GAD 7 Score 5 7  Anxiety Difficulty Somewhat difficult Somewhat difficult     Assessment/ Plan: 53 y.o. female   1. Current mild episode of major depressive disorder without prior episode (HCC) Worsening since our check in in June.  We discussed various options including Trintellix versus Wellbutrin.  She would like to start Wellbutrin since that seems to have a better impact on weight.  We discussed starting at 150 mg daily.  We discussed possible side effects of medication and reasons for discontinuation.  We discussed taper of the Cymbalta since this interest her but I also let her know that these could be used in conjunction.  Okay to start Cymbalta taper and perhaps with the adjunct of use of Wellbutrin should we can use a lower dose of Cymbalta.  I would like to see her back in about 4 weeks.  We discussed that virtual visit would be fine should she find this more convenient.  She will call the office to schedule this. - buPROPion (WELLBUTRIN XL) 150 MG 24 hr tablet; Take 1 tablet (150 mg total) by mouth every morning.  Dispense: 30 tablet; Refill: 1  2. Generalized anxiety disorder Stable.  Alprazolam renewed.  The national narcotic database was reviewed and there were no red flags.  Refills are appropriate. - ALPRAZolam (XANAX) 0.25 MG tablet; Take 1 tablet (0.25 mg total) by mouth 3 (three) times daily as needed for anxiety.  Dispense: 90 tablet; Refill: 2   Start time: 11:16am End time: 11:36am  Total time spent on patient care (including telephone call/ virtual visit): 25 minutes  Jahari Billy , DO [facility] [phone]

## 2019-04-27 ENCOUNTER — Ambulatory Visit (INDEPENDENT_AMBULATORY_CARE_PROVIDER_SITE_OTHER): Payer: PRIVATE HEALTH INSURANCE | Admitting: Podiatry

## 2019-04-27 ENCOUNTER — Encounter: Payer: Self-pay | Admitting: Podiatry

## 2019-04-27 ENCOUNTER — Ambulatory Visit (INDEPENDENT_AMBULATORY_CARE_PROVIDER_SITE_OTHER): Payer: PRIVATE HEALTH INSURANCE

## 2019-04-27 ENCOUNTER — Other Ambulatory Visit: Payer: Self-pay

## 2019-04-27 DIAGNOSIS — S8262XD Displaced fracture of lateral malleolus of left fibula, subsequent encounter for closed fracture with routine healing: Secondary | ICD-10-CM

## 2019-05-03 NOTE — Progress Notes (Signed)
Subjective:   Patient ID: Chelsea Gross, female   DOB: 53 y.o.   MRN: [id number]   HPI Patient states is feeling some better with swelling still noted if she does too much but it feels tight   ROS      Objective:  Physical Exam  Neurovascular status intact with range of motion adequate of the ankle and subtalar joint with no indications of excessive instability      Assessment:  Appears to be doing well after fracture with continued healing with continued mild discomfort but it is continuing to improve     Plan:  Reviewed condition recommended the continuation of ice therapy anti-inflammatory support and patient will be seen back for [place] to recheck again in the next 6 to 8 weeks as needed and can gradually increase activities at this time  X-rays indicate that the fracture continues to show signs of healing and is not completely healed but is gradually appearing to improve

## 2019-05-09 ENCOUNTER — Telehealth: Payer: Self-pay | Admitting: Family Medicine

## 2019-05-09 NOTE — Telephone Encounter (Signed)
Patient states that she had a procedure 1 year ago and they found a spot on her lung and advised her to follow up on it in 1 year. Scheduled appt with Dr Darnell Level

## 2019-05-10 ENCOUNTER — Ambulatory Visit: Payer: PRIVATE HEALTH INSURANCE | Admitting: Family Medicine

## 2019-05-10 ENCOUNTER — Other Ambulatory Visit: Payer: Self-pay

## 2019-05-10 ENCOUNTER — Encounter: Payer: Self-pay | Admitting: Family Medicine

## 2019-05-10 VITALS — BP 133/85 | HR 99 | Temp 97.3°F | Ht 64.0 in

## 2019-05-10 DIAGNOSIS — R918 Other nonspecific abnormal finding of lung field: Secondary | ICD-10-CM | POA: Diagnosis not present

## 2019-05-10 NOTE — Progress Notes (Signed)
Subjective: CC: Pulmonary nodules PCP: Janora Norlander, DO JSH:FWYOV Chelsea Gross is a 53 y.o. female presenting to clinic today for:  1.  Pulmonary nodules Patient notes that when she had a CT of her kidneys last year she was noted to have pulmonary nodules of undetermined significance.  Her urologist at that point recommend she follow-up with her PCP in 1 year for consideration of CT scan.  She denies any respiratory symptoms including shortness of breath, wheeze, hemoptysis, cough, unplanned weight loss, night sweats or chills.  She is a non-smoker.  As a child, she did have secondhand smoke exposure.  Her husband is a smoker but he smokes outside and she is not exposed to this.  ROS: Per HPI  Allergies  Allergen Reactions  . Sulfa Antibiotics Nausea Only  . Pantoprazole Rash   Past Medical History:  Diagnosis Date  . Anxiety   . Hyperlipemia   . Hypothyroid   . Thyroid disease   . Urinary incontinence     Current Outpatient Medications:  .  ALPRAZolam (XANAX) 0.25 MG tablet, Take 1 tablet (0.25 mg total) by mouth 3 (three) times daily as needed for anxiety., Disp: 90 tablet, Rfl: 2 .  atorvastatin (LIPITOR) 40 MG tablet, Take 40 mg by mouth daily at 6 PM. , Disp: , Rfl: 3 .  buPROPion (WELLBUTRIN XL) 150 MG 24 hr tablet, Take 1 tablet (150 mg total) by mouth every morning., Disp: 30 tablet, Rfl: 1 .  DULoxetine (CYMBALTA) 30 MG capsule, Take 1 capsule (30 mg total) by mouth daily. With the 60mg  capsule to equal 90mg  total daily., Disp: 30 capsule, Rfl: 12 .  levothyroxine (SYNTHROID, LEVOTHROID) 25 MCG tablet, Take 25 mcg by mouth daily before breakfast. , Disp: , Rfl: 1 .  medroxyPROGESTERone (PROVERA) 5 MG tablet, Take one tablet a day for 5 days every other month if no spontaneous menses., Disp: 15 tablet, Rfl: 1 .  metroNIDAZOLE (METROGEL) 1 % gel, Apply topically daily. For face, Disp: , Rfl:  .  omeprazole (PRILOSEC) 20 MG capsule, daily as needed., Disp: , Rfl: 6 .   triamcinolone lotion (KENALOG) 0.1 %, ataa bid, Disp: , Rfl:  .  valACYclovir (VALTREX) 1000 MG tablet, Take 500 mg by mouth. , Disp: , Rfl: 0 Social History   Socioeconomic History  . Marital status: Married    Spouse name: Not on file  . Number of children: Not on file  . Years of education: Not on file  . Highest education level: Not on file  Occupational History  . Occupation: CMA  Social Needs  . Financial resource strain: Not on file  . Food insecurity    Worry: Not on file    Inability: Not on file  . Transportation needs    Medical: Not on file    Non-medical: Not on file  Tobacco Use  . Smoking status: Never Smoker  . Smokeless tobacco: Never Used  Substance and Sexual Activity  . Alcohol use: No    Alcohol/week: 0.0 standard drinks  . Drug use: Yes    Comment: cannabis pills or gummies rarely  . Sexual activity: Yes    Partners: Male    Birth control/protection: None  Lifestyle  . Physical activity    Days per week: Not on file    Minutes per session: Not on file  . Stress: Not on file  Relationships  . Social Herbalist on phone: Not on file    Gets together:  Not on file    Attends religious service: Not on file    Active member of club or organization: Not on file    Attends meetings of clubs or organizations: Not on file    Relationship status: Not on file  . Intimate partner violence    Fear of current or ex partner: Not on file    Emotionally abused: Not on file    Physically abused: Not on file    Forced sexual activity: Not on file  Other Topics Concern  . Not on file  Social History Narrative  . Not on file   Family History  Problem Relation Age of Onset  . Heart attack Mother   . Esophageal cancer Mother   . Heart attack Father   . Hyperlipidemia Sister   . Thyroid disease Sister   . Hyperlipidemia Sister   . Hyperlipidemia Sister   . Hyperlipidemia Sister     Objective: Office vital signs reviewed. BP 133/85   Pulse 99    Temp (!) 97.3 F (36.3 C) (Temporal)   Ht 5\' 4"  (1.626 m)   Physical Examination:  General: Awake, alert, well nourished, No acute distress HEENT: Normal, Cardio: regular rate and rhythm, S1S2 heard, no murmurs appreciated Pulm: clear to auscultation bilaterally, no wheezes, rhonchi or rales; normal work of breathing on room air   Assessment/ Plan: 53 y.o. female   1. Pulmonary nodules I have requested records from [facility] urology regarding the CT scan and findings.  If the nodules were benign appearing and determined to be clinically insignificant we will not need to pursue CAT scan.  However, we discussed the possibility of needing a noncontrast CT of the lungs if they were suspicious.  She voiced good understanding.  Will await results before placing order.  If she does need to have this CAT scan she would like to have this done at [facility] imaging as this is close to her work.   No orders of the defined types were placed in this encounter.  No orders of the defined types were placed in this encounter.    [facility], DO [facility] [phone]

## 2019-05-18 ENCOUNTER — Other Ambulatory Visit: Payer: Self-pay | Admitting: Family

## 2019-05-18 DIAGNOSIS — R918 Other nonspecific abnormal finding of lung field: Secondary | ICD-10-CM

## 2019-06-15 ENCOUNTER — Other Ambulatory Visit: Payer: Self-pay | Admitting: Family Medicine

## 2019-06-15 DIAGNOSIS — F320 Major depressive disorder, single episode, mild: Secondary | ICD-10-CM

## 2019-06-16 ENCOUNTER — Other Ambulatory Visit: Payer: Self-pay | Admitting: Family Medicine

## 2019-06-16 DIAGNOSIS — R918 Other nonspecific abnormal finding of lung field: Secondary | ICD-10-CM

## 2019-06-20 ENCOUNTER — Other Ambulatory Visit: Payer: Self-pay | Admitting: Family

## 2019-07-07 ENCOUNTER — Other Ambulatory Visit: Payer: Self-pay

## 2019-07-07 ENCOUNTER — Ambulatory Visit (HOSPITAL_COMMUNITY): Payer: PRIVATE HEALTH INSURANCE

## 2019-07-07 ENCOUNTER — Ambulatory Visit (HOSPITAL_COMMUNITY)
Admission: RE | Admit: 2019-07-07 | Discharge: 2019-07-07 | Disposition: A | Discharge status: Home / Self Care | Payer: PRIVATE HEALTH INSURANCE | Source: Ambulatory Visit | Attending: Family Medicine | Admitting: Family Medicine

## 2019-07-07 DIAGNOSIS — R918 Other nonspecific abnormal finding of lung field: Secondary | ICD-10-CM

## 2019-07-13 ENCOUNTER — Other Ambulatory Visit: Payer: Self-pay | Admitting: Family Medicine

## 2019-07-13 DIAGNOSIS — F320 Major depressive disorder, single episode, mild: Secondary | ICD-10-CM

## 2019-07-19 ENCOUNTER — Encounter: Payer: Self-pay | Admitting: Family Medicine

## 2019-07-30 ENCOUNTER — Other Ambulatory Visit: Payer: Self-pay | Admitting: Family Medicine

## 2019-07-30 DIAGNOSIS — F411 Generalized anxiety disorder: Secondary | ICD-10-CM

## 2019-07-31 NOTE — Telephone Encounter (Signed)
Please have schedule OV, as per controlled substance contract and office policy, needs OV for each fill.

## 2019-08-02 ENCOUNTER — Ambulatory Visit (INDEPENDENT_AMBULATORY_CARE_PROVIDER_SITE_OTHER): Payer: 59 | Admitting: Family Medicine

## 2019-08-02 DIAGNOSIS — E782 Mixed hyperlipidemia: Secondary | ICD-10-CM

## 2019-08-02 DIAGNOSIS — E034 Atrophy of thyroid (acquired): Secondary | ICD-10-CM

## 2019-08-02 DIAGNOSIS — F411 Generalized anxiety disorder: Secondary | ICD-10-CM

## 2019-08-02 MED ORDER — BUSPIRONE HCL 5 MG PO TABS: ORAL_TABLET | ORAL | 0 refills | Status: AC

## 2019-08-02 MED ORDER — OMEPRAZOLE 20 MG PO CPDR: 20.0000 mg | DELAYED_RELEASE_CAPSULE | Freq: Every day | ORAL | 3 refills | Status: DC

## 2019-08-02 MED ORDER — ALPRAZOLAM 0.25 MG PO TABS: 0.2500 mg | ORAL_TABLET | Freq: Three times a day (TID) | ORAL | 2 refills | Status: DC | PRN

## 2019-08-02 MED ORDER — ATORVASTATIN CALCIUM 40 MG PO TABS: 40.0000 mg | ORAL_TABLET | Freq: Every day | ORAL | 3 refills | Status: DC

## 2019-08-02 MED ORDER — LEVOTHYROXINE SODIUM 25 MCG PO TABS: 25.0000 ug | ORAL_TABLET | Freq: Every day | ORAL | 1 refills | Status: DC

## 2019-08-02 NOTE — Progress Notes (Signed)
Telephone visit  Subjective: CC: GAD PCP: Janora Norlander, DO TOI:ZTIWP Chelsea Gross is a 54 y.o. female calls for telephone consult today. Patient provides verbal consent for consult held via phone.  Due to COVID-19 pandemic this visit was conducted virtually. This visit type was conducted due to national recommendations for restrictions regarding the COVID-19 Pandemic (e.g. social distancing, sheltering in place) in an effort to limit this patient's exposure and mitigate transmission in our community. All issues noted in this document were discussed and addressed.  A physical exam was not performed with this format.   Location of patient: work Location of provider: [facility] Others present for call: none  1. GAD Patient reports increased stress at the office.  She is the primary person testing for COVID-19 there.  She reports OCD behaviors.  She reports difficulty sleeping.  She reports only getting sleep for about 3 hours.  She is not seeing a counselor due to time constraints.  She has been taking 1-1/2 tablets of her alprazolam at nighttime in efforts to relieve stress and improve sleep.  Initially this did help some but it is not as effective now.  She is using the alprazolam much more often.  She does note that she is totally weaned off of Cymbalta and is only taking Wellbutrin currently.  2. Thyroid Patient reports loose stools, increased anxiety.  She reports compliance with thyroid medicine and needs refills on this.  3.  GERD Patient reports compliance with omeprazole 20 mg daily.  She does report loose stools as above.  She has had history of rash to pantoprazole in the past.  ROS: Per HPI  Allergies  Allergen Reactions  . Sulfa Antibiotics Nausea Only  . Pantoprazole Rash   Past Medical History:  Diagnosis Date  . Anxiety   . Hyperlipemia   . Hypothyroid   . Thyroid disease   . Urinary incontinence     Current Outpatient Medications:  .  ALPRAZolam (XANAX) 0.25 MG  tablet, Take 1 tablet (0.25 mg total) by mouth 3 (three) times daily as needed for anxiety., Disp: 90 tablet, Rfl: 2 .  atorvastatin (LIPITOR) 40 MG tablet, Take 40 mg by mouth daily at 6 PM. , Disp: , Rfl: 3 .  buPROPion (WELLBUTRIN XL) 150 MG 24 hr tablet, Take 1 tablet (150 mg total) by mouth daily. (Needs to be seen before next refill), Disp: 30 tablet, Rfl: 0 .  DULoxetine (CYMBALTA) 30 MG capsule, Take 1 capsule (30 mg total) by mouth daily. With the 60mg  capsule to equal 90mg  total daily., Disp: 30 capsule, Rfl: 12 .  levothyroxine (SYNTHROID, LEVOTHROID) 25 MCG tablet, Take 25 mcg by mouth daily before breakfast. , Disp: , Rfl: 1 .  medroxyPROGESTERone (PROVERA) 5 MG tablet, Take one tablet a day for 5 days every other month if no spontaneous menses., Disp: 15 tablet, Rfl: 1 .  metroNIDAZOLE (METROGEL) 1 % gel, Apply topically daily. For face, Disp: , Rfl:  .  omeprazole (PRILOSEC) 20 MG capsule, daily as needed., Disp: , Rfl: 6 .  triamcinolone lotion (KENALOG) 0.1 %, ataa bid, Disp: , Rfl:  .  valACYclovir (VALTREX) 1000 MG tablet, Take 500 mg by mouth. , Disp: , Rfl: 0  Gen: Sounds anxious phone  Assessment/ Plan: 54 y.o. female   1. Generalized anxiety disorder Not controlled.  We discussed that Wellbutrin when taken alone can sometimes aggravate anxiety symptoms.  I think now that the Cymbalta is off and the Wellbutrin is unopposed this may  be exacerbating symptoms.  Of course situational anxiety is quite evident in her situation as she is she is very worried about COVID-19 and spreading this to her husband who has an underlying lung disease.  We will start buspirone and I have given her instructions on how to titrate this up.  I had like to talk to her again in 4 weeks discussed if she needs higher dose.  In the interim, okay to continue alprazolam.  We discussed the risks of use of this medication and the development of tolerance with given prolonged use.  We will try to minimize use  of benzodiazepine.  The national narcotic database was reviewed and there were no red flags. - busPIRone (BUSPAR) 5 MG tablet; Take 1 tablet (5 mg total) by mouth 2 (two) times daily for 7 days, THEN 1.5 tablets (7.5 mg total) 2 (two) times daily for 7 days, THEN 2 tablets (10 mg total) 2 (two) times daily for 14 days.  Dispense: 91 tablet; Refill: 0 - ALPRAZolam (XANAX) 0.25 MG tablet; Take 1 tablet (0.25 mg total) by mouth 3 (three) times daily as needed for anxiety.  Dispense: 90 tablet; Refill: 2 - Thyroid Panel With TSH; Future  2. Hypothyroidism due to acquired atrophy of thyroid Has had some loose stools and increased anxiety as above.  She will have her labs collected at [facility] - Thyroid Panel With TSH; Future - COMPLETE METABOLIC PANEL WITH GFR; Future  3. Mixed hyperlipidemia She will have fasting lipid panel done at [facility] - Lipid panel; Future - COMPLETE METABOLIC PANEL WITH GFR; Future  Meds ordered this encounter  Medications  . busPIRone (BUSPAR) 5 MG tablet    Sig: Take 1 tablet (5 mg total) by mouth 2 (two) times daily for 7 days, THEN 1.5 tablets (7.5 mg total) 2 (two) times daily for 7 days, THEN 2 tablets (10 mg total) 2 (two) times daily for 14 days.    Dispense:  91 tablet    Refill:  0  . ALPRAZolam (XANAX) 0.25 MG tablet    Sig: Take 1 tablet (0.25 mg total) by mouth 3 (three) times daily as needed for anxiety.    Dispense:  90 tablet    Refill:  2  . omeprazole (PRILOSEC) 20 MG capsule    Sig: Take 1 capsule (20 mg total) by mouth daily.    Dispense:  90 capsule    Refill:  3  . atorvastatin (LIPITOR) 40 MG tablet    Sig: Take 1 tablet (40 mg total) by mouth daily at 6 PM.    Dispense:  90 tablet    Refill:  3  . levothyroxine (SYNTHROID) 25 MCG tablet    Sig: Take 1 tablet (25 mcg total) by mouth daily before breakfast.    Dispense:  90 tablet    Refill:  1   Start time: 9:00am End time: 9:15am  Total time spent on patient care (including telephone  call/ virtual visit): 20 minutes  Alf Doyle Hulen Skains, DO [facility] [phone]

## 2019-08-09 ENCOUNTER — Other Ambulatory Visit: Payer: Self-pay | Admitting: Family Medicine

## 2019-08-09 ENCOUNTER — Encounter: Payer: Self-pay | Admitting: Family Medicine

## 2019-08-09 DIAGNOSIS — F320 Major depressive disorder, single episode, mild: Secondary | ICD-10-CM

## 2019-08-09 MED ORDER — BUPROPION HCL ER (XL) 300 MG PO TB24: 300.0000 mg | ORAL_TABLET | Freq: Every day | ORAL | 0 refills | Status: DC

## 2019-08-28 ENCOUNTER — Encounter: Payer: Self-pay | Admitting: Family Medicine

## 2019-08-28 NOTE — Telephone Encounter (Signed)
I called patient to discuss symptoms. She states that she did not take any alprazolam this weekend a feels that her elevated heart rate was coming from that. I offered patient appointment today to be seen and she states she did not want to go to the Dr. Right now. I advised patient the importance of being seen for the evaluation of elevated heart rate. Patient states her HR now is 105.

## 2019-09-15 ENCOUNTER — Ambulatory Visit (INDEPENDENT_AMBULATORY_CARE_PROVIDER_SITE_OTHER): Payer: 59 | Admitting: Family Medicine

## 2019-09-15 ENCOUNTER — Encounter: Payer: Self-pay | Admitting: Family Medicine

## 2019-09-15 DIAGNOSIS — F32A Depression, unspecified: Secondary | ICD-10-CM

## 2019-09-15 DIAGNOSIS — F329 Major depressive disorder, single episode, unspecified: Secondary | ICD-10-CM

## 2019-09-15 MED ORDER — BUPROPION HCL ER (XL) 150 MG PO TB24: 150.0000 mg | ORAL_TABLET | Freq: Every day | ORAL | 0 refills | Status: DC

## 2019-09-15 NOTE — Progress Notes (Signed)
Telephone visit  Subjective: CC: depression PCP: Raliegh Ip, DO QQV:ZDGLO Chelsea Gross is a 54 y.o. female calls for telephone consult today. Patient provides verbal consent for consult held via phone.  Due to COVID-19 pandemic this visit was conducted virtually. This visit type was conducted due to national recommendations for restrictions regarding the COVID-19 Pandemic (e.g. social distancing, sheltering in place) in an effort to limit this patient's exposure and mitigate transmission in our community. All issues noted in this document were discussed and addressed.  A physical exam was not performed with this format.   Location of patient: work Location of provider: Working remotely from home Others present for call: none  1. Depression Patient reports that she continues to have depressive symptoms despite use of Wellbutrin.  She wonders if she should go back to Cymbalta.  She has been on Paxil and prozac before.  She does not recall them working well.  She notes that depression seems to be not as well controlled off of Cymbalta and on Wellbutrin.  She has mood lability and frequent crying episodes.  Does not report any SI or HI.   ROS: Per HPI  Allergies  Allergen Reactions  . Sulfa Antibiotics Nausea Only  . Pantoprazole Rash   Past Medical History:  Diagnosis Date  . Anxiety   . Hyperlipemia   . Hypothyroid   . Thyroid disease   . Urinary incontinence     Current Outpatient Medications:  .  ALPRAZolam (XANAX) 0.25 MG tablet, Take 1 tablet (0.25 mg total) by mouth 3 (three) times daily as needed for anxiety., Disp: 90 tablet, Rfl: 2 .  atorvastatin (LIPITOR) 40 MG tablet, Take 1 tablet (40 mg total) by mouth daily at 6 PM., Disp: 90 tablet, Rfl: 3 .  buPROPion (WELLBUTRIN XL) 300 MG 24 hr tablet, Take 1 tablet (300 mg total) by mouth daily., Disp: 90 tablet, Rfl: 0 .  levothyroxine (SYNTHROID) 25 MCG tablet, Take 1 tablet (25 mcg total) by mouth daily before  breakfast., Disp: 90 tablet, Rfl: 1 .  medroxyPROGESTERone (PROVERA) 5 MG tablet, Take one tablet a day for 5 days every other month if no spontaneous menses., Disp: 15 tablet, Rfl: 1 .  metroNIDAZOLE (METROGEL) 1 % gel, Apply topically daily. For face, Disp: , Rfl:  .  omeprazole (PRILOSEC) 20 MG capsule, Take 1 capsule (20 mg total) by mouth daily., Disp: 90 capsule, Rfl: 3 .  triamcinolone lotion (KENALOG) 0.1 %, ataa bid, Disp: , Rfl:  .  valACYclovir (VALTREX) 1000 MG tablet, Take 500 mg by mouth. , Disp: , Rfl: 0  Assessment/ Plan: 54 y.o. female   1. Uncontrolled depression Not controlled with increased dose of Wellbutrin.  We will start wean off of Wellbutrin and plan to transition over to Trintellix if covered by her insurance.  We also discussed consideration for genetic testing to see if we can figure out what medicines work best for her.  She is interested in both these options and will contact her insurance company to see if they are covered.  I will also work on getting her some samples of the Trintellix. - buPROPion (WELLBUTRIN XL) 150 MG 24 hr tablet; Take 1 tablet (150 mg total) by mouth daily.  Dispense: 30 tablet; Refill: 0   Start time: 2:13pm End time: 2:23pm  Total time spent on patient care (including telephone call/ virtual visit): 20 minutes  Mykael Batz Hulen Skains, DO [facility] [phone]

## 2019-09-15 NOTE — Patient Instructions (Signed)
Genetic company is called [employer] and the medication I was discussing with you is called Trintellix.  Coupon information is here: [email or web address]  I will look for samples for you on Monday.

## 2019-09-15 NOTE — Telephone Encounter (Signed)
Do you want her to schedule on appointment or is this something you would take care of through my chart?

## 2019-09-19 ENCOUNTER — Other Ambulatory Visit: Payer: Self-pay | Admitting: Family Medicine

## 2019-09-19 ENCOUNTER — Encounter: Payer: Self-pay | Admitting: Family Medicine

## 2019-09-19 MED ORDER — DULOXETINE HCL 60 MG PO CPEP: 60.0000 mg | ORAL_CAPSULE | Freq: Every day | ORAL | 3 refills | Status: DC

## 2019-09-26 ENCOUNTER — Other Ambulatory Visit: Payer: Self-pay | Admitting: Family Medicine

## 2019-09-26 ENCOUNTER — Encounter: Payer: Self-pay | Admitting: Family Medicine

## 2019-09-26 DIAGNOSIS — R000 Tachycardia, unspecified: Secondary | ICD-10-CM

## 2019-09-26 DIAGNOSIS — R002 Palpitations: Secondary | ICD-10-CM

## 2019-10-01 ENCOUNTER — Encounter: Payer: Self-pay | Admitting: Family Medicine

## 2019-10-02 ENCOUNTER — Encounter: Payer: Self-pay | Admitting: Family Medicine

## 2019-10-05 ENCOUNTER — Ambulatory Visit: Payer: PRIVATE HEALTH INSURANCE | Admitting: Cardiology

## 2019-10-13 ENCOUNTER — Encounter: Payer: Self-pay | Admitting: Cardiology

## 2019-10-13 ENCOUNTER — Ambulatory Visit (INDEPENDENT_AMBULATORY_CARE_PROVIDER_SITE_OTHER): Payer: 59

## 2019-10-13 ENCOUNTER — Ambulatory Visit (INDEPENDENT_AMBULATORY_CARE_PROVIDER_SITE_OTHER): Payer: 59 | Admitting: Cardiology

## 2019-10-13 ENCOUNTER — Other Ambulatory Visit: Payer: Self-pay | Admitting: Cardiology

## 2019-10-13 ENCOUNTER — Other Ambulatory Visit: Payer: Self-pay

## 2019-10-13 VITALS — BP 114/88 | HR 92 | Ht 64.0 in

## 2019-10-13 DIAGNOSIS — E782 Mixed hyperlipidemia: Secondary | ICD-10-CM

## 2019-10-13 DIAGNOSIS — R002 Palpitations: Secondary | ICD-10-CM

## 2019-10-13 DIAGNOSIS — R0789 Other chest pain: Secondary | ICD-10-CM

## 2019-10-13 DIAGNOSIS — R918 Other nonspecific abnormal finding of lung field: Secondary | ICD-10-CM

## 2019-10-13 DIAGNOSIS — R072 Precordial pain: Secondary | ICD-10-CM | POA: Diagnosis not present

## 2019-10-13 DIAGNOSIS — F411 Generalized anxiety disorder: Secondary | ICD-10-CM

## 2019-10-13 DIAGNOSIS — E034 Atrophy of thyroid (acquired): Secondary | ICD-10-CM

## 2019-10-13 HISTORY — DX: Palpitations: R00.2

## 2019-10-13 HISTORY — DX: Other chest pain: R07.89

## 2019-10-13 NOTE — Progress Notes (Signed)
Cardiology Office Note:    Date:  10/13/2019   ID:  Chelsea Gross, DOB March 07, 1966, MRN [id number]  PCP:  Janora Norlander, DO  Cardiologist:  Jenean Lindau, MD   Referring MD: Janora Norlander, DO    ASSESSMENT:    1. Mixed hyperlipidemia   2. Palpitations   3. Chest discomfort    PLAN:    In order of problems listed above:  1. Primary prevention stressed with the patient.  Importance of compliance with diet and medication stressed and she vocalized understanding. 2. Palpitations: They are of significant concern to her.  Her thyroid testing appears to be unremarkable.  She is getting it done repeat by her primary care physician.  In view of her significant symptoms we will do a ZIO monitoring for 2 weeks.  She knows to go to the nearest emergency room for any concerning symptoms. 3. Chest discomfort: Appears atypical for coronary etiology however in view of her symptoms and risk factors we will do a Lexiscan sestamibi. 4. Mixed dyslipidemia: Patient on statin therapy.  Diet advised.  Weight reduction stressed compliance with diet and medication urged she vocalized understanding.  Questions were answered to her satisfaction.Patient will be seen in follow-up appointment in 3 months or earlier if the patient has any concerns    Medication Adjustments/Labs and Tests Ordered: Current medicines are reviewed at length with the patient today.  Concerns regarding medicines are outlined above.  No orders of the defined types were placed in this encounter.  No orders of the defined types were placed in this encounter.    History of Present Illness:    Chelsea Gross is a 54 y.o. female who is being seen today for the evaluation of palpitations and chest discomfort at the request of Chelsea Gross M, DO.  Patient is a pleasant 54 year old female.  She has past medical history of mixed dyslipidemia.  She has also history of hypothyroidism and takes supplemental thyroid.   Patient mentions to me that over the past several weeks she is having palpitations on and off.  These have caused her significant issues with her quality of life.  She is anxious about it.  She occasionally has chest discomfort and this is not related to exertion.  She leads a sedentary lifestyle and is overweight.  At the time of my evaluation, the patient is alert awake oriented and in no distress.  Past Medical History:  Diagnosis Date  . Anxiety   . Controlled substance agreement signed 09/21/2018  . Depression 04/16/2016  . Generalized anxiety disorder 09/21/2018  . Hyperlipemia   . Hyperlipidemia 04/16/2016  . Hypothyroid   . Hypothyroidism 04/16/2016  . Thyroid disease   . Urinary incontinence     Past Surgical History:  Procedure Laterality Date  . chin and lip surgery  2001  . labial repair  2000    Current Medications: Current Meds  Medication Sig  . ALPRAZolam (XANAX) 0.25 MG tablet Take 1 tablet (0.25 mg total) by mouth 3 (three) times daily as needed for anxiety.  Marland Kitchen atorvastatin (LIPITOR) 40 MG tablet Take 1 tablet (40 mg total) by mouth daily at 6 PM.  . DULoxetine (CYMBALTA) 60 MG capsule Take 1 capsule (60 mg total) by mouth daily.  Marland Kitchen levothyroxine (SYNTHROID) 25 MCG tablet Take 1 tablet (25 mcg total) by mouth daily before breakfast.  . omeprazole (PRILOSEC) 20 MG capsule Take 1 capsule (20 mg total) by mouth daily.  . valACYclovir (VALTREX) 1000  MG tablet Take 500 mg by mouth.   . [DISCONTINUED] buPROPion (WELLBUTRIN XL) 150 MG 24 hr tablet Take 1 tablet (150 mg total) by mouth daily.     Allergies:   Sulfa antibiotics and Pantoprazole   Social History   Socioeconomic History  . Marital status: Married    Spouse name: Not on file  . Number of children: Not on file  . Years of education: Not on file  . Highest education level: Not on file  Occupational History  . Occupation: CMA  Tobacco Use  . Smoking status: Never Smoker  . Smokeless tobacco: Never Used    Substance and Sexual Activity  . Alcohol use: No    Alcohol/week: 0.0 standard drinks  . Drug use: Yes    Comment: cannabis pills or gummies rarely  . Sexual activity: Yes    Partners: Male    Birth control/protection: None  Other Topics Concern  . Not on file  Social History Narrative  . Not on file   Social Determinants of Health   Financial Resource Strain:   . Difficulty of Paying Living Expenses:   Food Insecurity:   . Worried About Programme researcher, broadcasting/film/video in the Last Year:   . Barista in the Last Year:   Transportation Needs:   . Freight forwarder (Medical):   Marland Kitchen Lack of Transportation (Non-Medical):   Physical Activity:   . Days of Exercise per Week:   . Minutes of Exercise per Session:   Stress:   . Feeling of Stress :   Social Connections:   . Frequency of Communication with Friends and Family:   . Frequency of Social Gatherings with Friends and Family:   . Attends Religious Services:   . Active Member of Clubs or Organizations:   . Attends Banker Meetings:   Marland Kitchen Marital Status:      Family History: The patient's family history includes Esophageal cancer in her mother; Heart attack in her father and mother; Hyperlipidemia in her sister, sister, sister, and sister; Thyroid disease in her sister.  ROS:   Please see the history of present illness.    All other systems reviewed and are negative.  EKGs/Labs/Other Studies Reviewed:    The following studies were reviewed today: EKG reveals sinus rhythm and nonspecific ST-T changes.   Recent Labs: No results found for requested labs within last 8760 hours.  Recent Lipid Panel    Component Value Date/Time   CHOL 197 09/21/2018 1508   TRIG 193 (H) 09/21/2018 1508   HDL 47 09/21/2018 1508   CHOLHDL 4.2 09/21/2018 1508   LDLCALC 111 (H) 09/21/2018 1508    Physical Exam:    VS:  BP 114/88   Pulse 92   Ht 5\' 4"  (1.626 m)   SpO2 96%     Wt Readings from Last 3 Encounters:  No data  found for Wt     GEN: Patient is in no acute distress HEENT: Normal NECK: No JVD; No carotid bruits LYMPHATICS: No lymphadenopathy CARDIAC: S1 S2 regular, 2/6 systolic murmur at the apex. RESPIRATORY:  Clear to auscultation without rales, wheezing or rhonchi  ABDOMEN: Soft, non-tender, non-distended MUSCULOSKELETAL:  No edema; No deformity  SKIN: Warm and dry NEUROLOGIC:  Alert and oriented x 3 PSYCHIATRIC:  Normal affect    Signed, , MD  10/13/2019 3:28 PM    [facility]

## 2019-10-13 NOTE — Patient Instructions (Signed)
Medication Instructions:  No medication changes *If you need a refill on your cardiac medications before your next appointment, please call your pharmacy*   Lab Work: None ordered If you have labs (blood work) drawn today and your tests are completely normal, you will receive your results only by: Marland Kitchen MyChart Message (if you have MyChart) OR . A paper copy in the mail If you have any lab test that is abnormal or we need to change your treatment, we will call you to review the results.   Testing/Procedures: Your physician has requested that you have a lexiscan myoview. For further information please visit [email or web address]. Please follow instruction sheet, as given.  The test will take approximately 3 to 4 hours to complete; you may bring reading material.  If someone comes with you to your appointment, they will need to remain in the main lobby due to limited space in the testing area. **If you are pregnant or breastfeeding, please notify the nuclear lab prior to your appointment**  How to prepare for your Myocardial Perfusion Test: . Do not eat or drink 3 hours prior to your test, except you may have water. . Do not consume products containing caffeine (regular or decaffeinated) 12 hours prior to your test. (ex: coffee, chocolate, sodas, tea). . Do bring a list of your current medications with you.  If not listed below, you may take your medications as normal. . Do wear comfortable clothes (no dresses or overalls) and walking shoes, tennis shoes preferred (No heels or open toe shoes are allowed). . Do NOT wear cologne, perfume, aftershave, or lotions (deodorant is allowed). . If these instructions are not followed, your test will have to be rescheduled.   WHY IS MY DOCTOR PRESCRIBING ZIO? The Zio system is proven and trusted by physicians to detect and diagnose irregular heart rhythms -- and has been prescribed to hundreds of thousands of patients.  The FDA has cleared the Zio system  to monitor for many different kinds of irregular heart rhythms. In a study, physicians were able to reach a diagnosis 90% of the time with the Zio system1.  You can wear the Zio monitor -- a small, discreet, comfortable patch -- during your normal day-to-day activity, including while you sleep, shower, and exercise, while it records every single heartbeat for analysis.  1Barrett, P., et al. Comparison of 24 Hour Holter Monitoring Versus 14 Day Novel Adhesive Patch Electrocardiographic Monitoring. [place], 2014.  ZIO VS. HOLTER MONITORING The Zio monitor can be comfortably worn for up to 14 days. Holter monitors can be worn for 24 to 48 hours, limiting the time to record any irregular heart rhythms you may have. Zio is able to capture data for the 51% of patients who have their first symptom-triggered arrhythmia after 48 hours.1  LIVE WITHOUT RESTRICTIONS The Zio ambulatory cardiac monitor is a small, unobtrusive, and water-resistant patch--you might even forget you're wearing it. The Zio monitor records and stores every beat of your heart, whether you're sleeping, working out, or showering.  Wear the monitor for 2 weeks. Remove on 10/27/19.   Follow-Up: At [facility], you and your health needs are our priority.  As part of our continuing mission to provide you with exceptional heart care, we have created designated Provider Care Teams.  These Care Teams include your primary Cardiologist (physician) and Advanced Practice Providers (APPs -  Physician Assistants and Nurse Practitioners) who all work together to provide you with the care you need, when you  need it.  We recommend signing up for the patient portal called "MyChart".  Sign up information is provided on this After Visit Summary.  MyChart is used to connect with patients for Virtual Visits (Telemedicine).  Patients are able to view lab/test results, encounter notes, upcoming appointments, etc.  Non-urgent messages can  be sent to your provider as well.   To learn more about what you can do with MyChart, go to [email or web address].    Your next appointment:   3 month(s)  The format for your next appointment:   In Person  Provider:   Belva Crome, MD   Other Instructions NA

## 2019-10-19 ENCOUNTER — Telehealth (HOSPITAL_COMMUNITY): Payer: Self-pay

## 2019-10-19 NOTE — Telephone Encounter (Signed)
Pt declined instructions for testing. Pt declined to cancel or reschedule appointment. Pt states she will have her cardiologist contact us regarding any changes.

## 2019-10-24 ENCOUNTER — Ambulatory Visit (HOSPITAL_COMMUNITY): Admission: RE | Admit: 2019-10-24 | Payer: 59 | Source: Ambulatory Visit | Attending: Cardiology | Admitting: Cardiology

## 2019-10-24 ENCOUNTER — Encounter: Payer: Self-pay | Admitting: Family Medicine

## 2019-10-24 ENCOUNTER — Other Ambulatory Visit: Payer: Self-pay | Admitting: Family Medicine

## 2019-10-24 DIAGNOSIS — L719 Rosacea, unspecified: Secondary | ICD-10-CM

## 2019-10-24 MED ORDER — METRONIDAZOLE 0.75 % EX GEL: 1.0000 "application " | Freq: Two times a day (BID) | CUTANEOUS | 12 refills | Status: DC

## 2019-10-24 NOTE — Telephone Encounter (Signed)
Do you want patient to be seen or televisit?

## 2019-10-25 ENCOUNTER — Encounter (HOSPITAL_COMMUNITY): Payer: 59

## 2019-10-27 ENCOUNTER — Telehealth (HOSPITAL_COMMUNITY): Payer: Self-pay | Admitting: Cardiology

## 2019-10-27 NOTE — Telephone Encounter (Signed)
Patient has cancelled Myoview and does not wish to have and will follow up with PCP per patient and will do another kind of test.I have already ordered this as a future order in the computer. Order will be removed from the WQ.

## 2019-10-27 NOTE — Telephone Encounter (Signed)
FYI

## 2019-10-27 NOTE — Telephone Encounter (Signed)
Sylvan Cheese doin girl !! hahahaa

## 2019-11-02 ENCOUNTER — Ambulatory Visit: Payer: Self-pay | Admitting: Cardiology

## 2019-11-03 ENCOUNTER — Telehealth: Payer: Self-pay | Admitting: *Deleted

## 2019-11-03 NOTE — Telephone Encounter (Signed)
Received fax from Carmon Sails at [facility] for more information on pt to justify long term monitor placed on 10/13/19. Sent office note and ekg to fax number [phone]

## 2019-11-06 ENCOUNTER — Other Ambulatory Visit: Payer: Self-pay | Admitting: Family Medicine

## 2019-11-06 DIAGNOSIS — F411 Generalized anxiety disorder: Secondary | ICD-10-CM

## 2019-11-12 ENCOUNTER — Other Ambulatory Visit: Payer: Self-pay | Admitting: Family Medicine

## 2019-11-12 DIAGNOSIS — F411 Generalized anxiety disorder: Secondary | ICD-10-CM

## 2019-11-13 ENCOUNTER — Encounter: Payer: Self-pay | Admitting: Family Medicine

## 2019-11-27 ENCOUNTER — Encounter: Payer: Self-pay | Admitting: Family Medicine

## 2019-11-27 ENCOUNTER — Other Ambulatory Visit: Payer: Self-pay

## 2019-11-27 ENCOUNTER — Ambulatory Visit: Payer: 59 | Admitting: Family Medicine

## 2019-11-27 VITALS — BP 138/85 | HR 85 | Temp 97.9°F | Ht 64.0 in

## 2019-11-27 DIAGNOSIS — F411 Generalized anxiety disorder: Secondary | ICD-10-CM | POA: Diagnosis not present

## 2019-11-27 DIAGNOSIS — B009 Herpesviral infection, unspecified: Secondary | ICD-10-CM

## 2019-11-27 DIAGNOSIS — E034 Atrophy of thyroid (acquired): Secondary | ICD-10-CM

## 2019-11-27 DIAGNOSIS — F320 Major depressive disorder, single episode, mild: Secondary | ICD-10-CM | POA: Diagnosis not present

## 2019-11-27 DIAGNOSIS — E782 Mixed hyperlipidemia: Secondary | ICD-10-CM

## 2019-11-27 DIAGNOSIS — Z79899 Other long term (current) drug therapy: Secondary | ICD-10-CM | POA: Diagnosis not present

## 2019-11-27 MED ORDER — VALACYCLOVIR HCL 500 MG PO TABS: 500.0000 mg | ORAL_TABLET | Freq: Every day | ORAL | 12 refills | Status: DC

## 2019-11-27 MED ORDER — ALPRAZOLAM 0.25 MG PO TABS: 0.2500 mg | ORAL_TABLET | Freq: Three times a day (TID) | ORAL | 5 refills | Status: DC | PRN

## 2019-11-27 MED ORDER — ATORVASTATIN CALCIUM 40 MG PO TABS: 40.0000 mg | ORAL_TABLET | Freq: Every day | ORAL | 12 refills | Status: DC

## 2019-11-27 NOTE — Patient Instructions (Signed)

## 2019-11-27 NOTE — Progress Notes (Signed)
Subjective: CC: f/u anxiety disorder, hypothyroidism, hyperlipidemia HPI: Chelsea Gross is a 54 y.o. female presenting to clinic today for:  1.  Generalized anxiety disorder and depression History: Patient with longstanding history of anxiety and depression.  Started in her teens and has been essentially stable on Cymbalta and as needed Xanax.    Patient had quite a bit of intolerance to the Wellbutrin.  It was causing heart palpitations.  Since weaning off of the medicine she has been doing quite well.  She continues to take Cymbalta and Xanax up to 3 times daily.  Denies any excessive daytime sleepiness, falls, memory changes or mental status changes.  2.  Hypothyroidism History: diagnosed 4 years ago.  FamHx: sister hypothyroidism.  Compliant with Synthroid.  No heart palpitations now that she is discontinued Wellbutrin.  No change in bowel habits or weight.  No difficulty swallowing or change in voice.  She is considering starting something for menopause.  She is about 2 years postmenopausal and has a follow-up visit with her GYN soon.  3.  Hyperlipidemia Compliant with Lipitor 40 mg daily.  Does not report any chest pain, shortness of breath.  4.  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 OD for prevention.  She has at least 3 flares orally or vaginally per year.   Past Medical History:  Diagnosis Date  . Anxiety   . Controlled substance agreement signed 09/21/2018  . Depression 04/16/2016  . Generalized anxiety disorder 09/21/2018  . Hyperlipemia   . Hyperlipidemia 04/16/2016  . Hypothyroid   . Hypothyroidism 04/16/2016  . Thyroid disease   . Urinary incontinence    Past Surgical History:  Procedure Laterality Date  . chin and lip surgery  2001  . labial repair  2000   Social History   Socioeconomic History  . Marital status: Married    Spouse name: Not on file  . Number of children: Not on file  . Years of education: Not on file  . Highest education level: Not  on file  Occupational History  . Occupation: CMA  Tobacco Use  . Smoking status: Never Smoker  . Smokeless tobacco: Never Used  Substance and Sexual Activity  . Alcohol use: No    Alcohol/week: 0.0 standard drinks  . Drug use: Yes    Comment: cannabis pills or gummies rarely  . Sexual activity: Yes    Partners: Male    Birth control/protection: None  Other Topics Concern  . Not on file  Social History Narrative  . Not on file   Social Determinants of Health   Financial Resource Strain:   . Difficulty of Paying Living Expenses:   Food Insecurity:   . Worried About Charity fundraiser in the Last Year:   . Arboriculturist in the Last Year:   Transportation Needs:   . Film/video editor (Medical):   Marland Kitchen Lack of Transportation (Non-Medical):   Physical Activity:   . Days of Exercise per Week:   . Minutes of Exercise per Session:   Stress:   . Feeling of Stress :   Social Connections:   . Frequency of Communication with Friends and Family:   . Frequency of Social Gatherings with Friends and Family:   . Attends Religious Services:   . Active Member of Clubs or Organizations:   . Attends Archivist Meetings:   Marland Kitchen Marital Status:   Intimate Partner Violence:   . Fear of Current or Ex-Partner:   .  Emotionally Abused:   Marland Kitchen Physically Abused:   . Sexually Abused:    Current Meds  Medication Sig  . ALPRAZolam (XANAX) 0.25 MG tablet Take 1 tablet (0.25 mg total) by mouth 3 (three) times daily as needed for anxiety.  Marland Kitchen atorvastatin (LIPITOR) 40 MG tablet Take 1 tablet (40 mg total) by mouth daily at 6 PM.  . DULoxetine (CYMBALTA) 60 MG capsule Take 1 capsule (60 mg total) by mouth daily.  Marland Kitchen levothyroxine (SYNTHROID) 25 MCG tablet Take 1 tablet (25 mcg total) by mouth daily before breakfast.  . metroNIDAZOLE (METROGEL) 0.75 % gel Apply 1 application topically 2 (two) times daily. For rosacea  . omeprazole (PRILOSEC) 20 MG capsule Take 1 capsule (20 mg total) by mouth  daily.  . valACYclovir (VALTREX) 1000 MG tablet Take 500 mg by mouth.    Family History  Problem Relation Age of Onset  . Heart attack Mother   . Esophageal cancer Mother   . Heart attack Father   . Hyperlipidemia Sister   . Thyroid disease Sister   . Hyperlipidemia Sister   . Hyperlipidemia Sister   . Hyperlipidemia Sister    Allergies  Allergen Reactions  . Sulfa Antibiotics Nausea Only  . Pantoprazole Rash     Health Maintenance: UTD  ROS: Per HPI  Objective: Office vital signs reviewed. BP 138/85   Pulse 85   Temp 97.9 F (36.6 C) (Temporal)   Ht '5\' 4"'$  (1.626 m)   SpO2 97%   Physical Examination:  General: Awake, alert, well nourished, No acute distress HEENT: Normal; no exophthalmos.  No goiter.  No palpable thyroid nodules Cardio: regular rate and rhythm, S1S2 heard, no murmurs appreciated Pulm: clear to auscultation bilaterally, no wheezes, rhonchi or rales; normal work of breathing on room air Extremities: warm, well perfused, No edema, cyanosis or clubbing; +2 pulses bilaterally MSK: normal gait and station Skin: dry; intact; no rashes or lesions; normal temperature. Neuro: no resting tremor Psych: Mood stable, speech normal, affect appropriate.  Does not appear anxious Depression screen [facility] 2/9 11/27/2019 05/10/2019 04/24/2019  Decreased Interest 0 0 2  Down, Depressed, Hopeless 0 0 2  PHQ - 2 Score 0 0 4  Altered sleeping 0 0 3  Tired, decreased energy 0 0 0  Change in appetite 0 0 1  Feeling bad or failure about yourself  0 0 2  Trouble concentrating 0 0 1  Moving slowly or fidgety/restless 0 0 0  Suicidal thoughts 0 0 1  PHQ-9 Score 0 0 12  Difficult doing work/chores - - Somewhat difficult   GAD 7 : Generalized Anxiety Score 04/24/2019 01/17/2019  Nervous, Anxious, on Edge 1 1  Control/stop worrying 1 1  Worry too much - different things 1 1  Trouble relaxing 1 1  Restless 0 1  Easily annoyed or irritable 0 1  Afraid - awful might happen 1 1    Total GAD 7 Score 5 7  Anxiety Difficulty Somewhat difficult Somewhat difficult   Assessment/ Plan: 54 y.o. female   1. Hypothyroidism due to acquired atrophy of thyroid Asymptomatic.  Check thyroid panel - CMP14+EGFR - Thyroid Panel With TSH  2. Generalized anxiety disorder Stable with Cymbalta and as needed Xanax.  No red flags. - ToxASSURE Select 13 (MW), Urine - ALPRAZolam (XANAX) 0.25 MG tablet; Take 1 tablet (0.25 mg total) by mouth 3 (three) times daily as needed for anxiety.  Dispense: 90 tablet; Refill: 5  3. Current mild episode of major depressive  disorder without prior episode ([place]) Stable  4. Controlled substance agreement signed - ToxASSURE Select 13 (MW), Urine  5. Mixed hyperlipidemia Check nonfasting lipid panel - CMP14+EGFR - Lipid Panel - Thyroid Panel With TSH - atorvastatin (LIPITOR) 40 MG tablet; Take 1 tablet (40 mg total) by mouth daily at 6 PM.  Dispense: 30 tablet; Refill: 12  6. HSV infection Stable - valACYclovir (VALTREX) 500 MG tablet; Take 1 tablet (500 mg total) by mouth daily.  Dispense: 30 tablet; Refill: 12   The Narcotic Database has been reviewed.  There were no red flags.  Fills are consistent  Janora Norlander, [place] [phone]

## 2019-11-28 ENCOUNTER — Other Ambulatory Visit: Payer: Self-pay | Admitting: Family Medicine

## 2019-11-28 LAB — LIPID PANEL
Chol/HDL Ratio: 3.6 ratio (ref 0.0–4.4)
Cholesterol, Total: 179 mg/dL (ref 100–199)
HDL: 50 mg/dL (ref >39)
LDL Chol Calc (NIH): 100 mg/dL — ABNORMAL HIGH (ref 0–99)
Triglycerides: 165 mg/dL — ABNORMAL HIGH (ref 0–149)
VLDL Cholesterol Cal: 29 mg/dL (ref 5–40)

## 2019-11-28 LAB — CMP14+EGFR
ALT: 20 IU/L (ref 0–32)
AST: 18 IU/L (ref 0–40)
Albumin/Globulin Ratio: 1.7 (ref 1.2–2.2)
Albumin: 4.2 g/dL (ref 3.8–4.9)
Alkaline Phosphatase: 126 IU/L — ABNORMAL HIGH (ref 39–117)
BUN/Creatinine Ratio: 16 (ref 9–23)
BUN: 12 mg/dL (ref 6–24)
Bilirubin Total: 0.4 mg/dL (ref 0.0–1.2)
CO2: 24 mmol/L (ref 20–29)
Calcium: 9.4 mg/dL (ref 8.7–10.2)
Chloride: 102 mmol/L (ref 96–106)
Creatinine, Ser: 0.76 mg/dL (ref 0.57–1.00)
GFR calc Af Amer: 103 mL/min/{1.73_m2} (ref >59)
GFR calc non Af Amer: 89 mL/min/{1.73_m2} (ref >59)
Globulin, Total: 2.5 g/dL (ref 1.5–4.5)
Glucose: 93 mg/dL (ref 65–99)
Potassium: 4.7 mmol/L (ref 3.5–5.2)
Sodium: 141 mmol/L (ref 134–144)
Total Protein: 6.7 g/dL (ref 6.0–8.5)

## 2019-11-28 LAB — THYROID PANEL WITH TSH
Free Thyroxine Index: 1.8 (ref 1.2–4.9)
T3 Uptake Ratio: 25 % (ref 24–39)
T4, Total: 7.3 ug/dL (ref 4.5–12.0)
TSH: 2.76 u[IU]/mL (ref 0.450–4.500)

## 2019-11-28 MED ORDER — LEVOTHYROXINE SODIUM 25 MCG PO TABS: 25.0000 ug | ORAL_TABLET | Freq: Every day | ORAL | 1 refills | Status: DC

## 2019-11-29 ENCOUNTER — Other Ambulatory Visit: Payer: Self-pay

## 2019-11-29 LAB — TOXASSURE SELECT 13 (MW), URINE

## 2019-11-29 NOTE — Progress Notes (Signed)
54 y.o. [id number] Married White or Caucasian Not Hispanic or Latino female here for annual exam.  Patient states that is has been about a year since she last had a period. She would like to talk about HRTs.  No hot flashes or night sweats. Her skin has gotten really dry and her joints ache. Gets home will ache all over, muscles and joints.  Sexually active, some dryness, uses a lubricant which helps some, she would like vaginal estrogen.   She has a known right bartholin's cyst, present for years.     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  No LMP recorded. (Menstrual status: Perimenopausal).          Sexually active: Yes.    The current method of family planning is none.    Exercising: No.  The patient does not participate in regular exercise at present. Smoker:  no  Health Maintenance: Pap:  01/30/2016 neg HPV HR neg History of abnormal Pap:  no MMG:  09/24/17 Bi-rads 1 neg  Density B, reports having a mammogram at [place] last summer.   BMD:   Never Colonoscopy: 2019 normal  TDaP:  12/20/17 Gardasil: NA   reports that she has never smoked. She has never used smokeless tobacco. She reports current drug use. She reports that she does not drink alcohol. 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 Daughter graduated from [place] in 12/20. Will get her MBA at [employer]   Past Medical History:  Diagnosis Date  . Anxiety   . Controlled substance agreement signed 09/21/2018  . Depression 04/16/2016  . Generalized anxiety disorder 09/21/2018  . Hyperlipemia   . Hyperlipidemia 04/16/2016  . Hypothyroid   . Hypothyroidism 04/16/2016  . Thyroid disease   . Urinary incontinence     Past Surgical History:  Procedure Laterality Date  . chin and lip surgery  2001  . labial repair  2000    Current Outpatient Medications  Medication Sig Dispense Refill  . ALPRAZolam (XANAX) 0.25 MG tablet Take 1 tablet (0.25 mg total) by mouth 3 (three) times daily as needed for anxiety. 90  tablet 5  . atorvastatin (LIPITOR) 40 MG tablet Take 1 tablet (40 mg total) by mouth daily at 6 PM. 30 tablet 12  . DULoxetine (CYMBALTA) 60 MG capsule Take 1 capsule (60 mg total) by mouth daily. 90 capsule 3  . levothyroxine (SYNTHROID) 25 MCG tablet Take 1 tablet (25 mcg total) by mouth daily before breakfast. 90 tablet 1  . metroNIDAZOLE (METROGEL) 0.75 % gel Apply 1 application topically 2 (two) times daily. For rosacea 45 g 12  . omeprazole (PRILOSEC) 20 MG capsule Take 1 capsule (20 mg total) by mouth daily. 90 capsule 3  . valACYclovir (VALTREX) 500 MG tablet Take 1 tablet (500 mg total) by mouth daily. 30 tablet 12   No current facility-administered medications for this visit.    Family History  Problem Relation Age of Onset  . Heart attack Mother   . Esophageal cancer Mother   . Heart attack Father   . Hyperlipidemia Sister   . Thyroid disease Sister   . Hyperlipidemia Sister   . Hyperlipidemia Sister   . Hyperlipidemia Sister     Review of Systems  All other systems reviewed and are negative.   Exam:   BP 110/62   Pulse 100   Temp 97.9 F (36.6 C)   Ht 5\' 4"  (1.626 m)   SpO2 99%   Weight change: @WEIGHTCHANGE @  Height:   Height: 5\' 4"  (162.6 cm)  Ht Readings from Last 3 Encounters:  11/30/19 5\' 4"  (1.626 m)  11/27/19 5\' 4"  (1.626 m)  10/13/19 5\' 4"  (1.626 m)    General appearance: alert, cooperative and appears stated age Head: Normocephalic, without obvious abnormality, atraumatic Neck: no adenopathy, supple, symmetrical, trachea midline and thyroid normal to inspection and palpation Lungs: clear to auscultation bilaterally Cardiovascular: regular rate and rhythm Breasts: normal appearance, no masses or tenderness Abdomen: soft, non-tender; non distended,  no masses,  no organomegaly Extremities: extremities normal, atraumatic, no cyanosis or edema Skin: Skin color, texture, turgor normal. No rashes or lesions Lymph nodes: Cervical, supraclavicular, and  axillary nodes normal. No abnormal inguinal nodes palpated Neurologic: Grossly normal   Pelvic: External genitalia:  no lesions              Urethra:  normal appearing urethra with no masses, tenderness or lesions              Bartholins and Skenes: normal                 Vagina: mildly atrophic appearing vagina with normal color and discharge, no lesions              Cervix: no lesions               Bimanual Exam:  Uterus:  normal size, contour, position, consistency, mobility, non-tender and retroverted              Adnexa: no mass, fullness, tenderness               Rectovaginal: Confirms               Anus:  normal sphincter tone, no lesions  01/27/20 chaperoned for the exam.  A:  Well Woman with normal exam  Vaginal atrophy, dyspareunia  PMP, no significant vasomotor symptoms. She does c/o dry skin and joint pain.  P:   Pap next year  Mammogram this summer  Discussed breast self exam  Discussed calcium and vit D intake  Colonoscopy UTD  Labs with primary  Start vaginal estrogen.   Recommended she f/u with her primary for joint and muscle pain.

## 2019-11-30 ENCOUNTER — Encounter: Payer: Self-pay | Admitting: Obstetrics and Gynecology

## 2019-11-30 ENCOUNTER — Ambulatory Visit: Payer: 59 | Admitting: Obstetrics and Gynecology

## 2019-11-30 VITALS — BP 110/62 | HR 100 | Temp 97.9°F | Ht 64.0 in

## 2019-11-30 DIAGNOSIS — N952 Postmenopausal atrophic vaginitis: Secondary | ICD-10-CM | POA: Diagnosis not present

## 2019-11-30 DIAGNOSIS — Z780 Asymptomatic menopausal state: Secondary | ICD-10-CM

## 2019-11-30 DIAGNOSIS — N941 Unspecified dyspareunia: Secondary | ICD-10-CM | POA: Diagnosis not present

## 2019-11-30 DIAGNOSIS — Z01419 Encounter for gynecological examination (general) (routine) without abnormal findings: Secondary | ICD-10-CM | POA: Diagnosis not present

## 2019-11-30 MED ORDER — ESTRADIOL 2 MG VA RING: 2.0000 mg | VAGINAL_RING | VAGINAL | 4 refills | Status: DC

## 2019-11-30 NOTE — Patient Instructions (Signed)

## 2019-12-01 ENCOUNTER — Telehealth: Payer: Self-pay | Admitting: Obstetrics and Gynecology

## 2019-12-01 MED ORDER — ESTRADIOL 2 MG VA RING: 2.0000 mg | VAGINAL_RING | VAGINAL | 4 refills | Status: DC

## 2019-12-01 NOTE — Telephone Encounter (Signed)
Chelsea Gross, [facility]  Phone Number: [phone]  Hi, the estrogen ring was going to be 400 dollars. Can you send in tablets or the patch? Thanks.

## 2019-12-01 NOTE — Telephone Encounter (Signed)
[facility] 11/30/19. Vaginal atrophy and to start on Estring Rx.   Spoke with pt. Pt reports went to pharmacy to pick up Rx yesterday and it was cost prohibitive.  After reviewing pt's insurance, pt needed Rx to go through mail order pharmacy per [facility] formulary list.  Resent Estring Rx as prescribed by Dr Oscar La # 1, 4RF to [facility] pharmacy mail order pharmacy per pt's insurance card. Pt agreeable and to return call to office if any other questions or concerns with Rx.   Routing to Dr Oscar La for review.  Encounter closed.

## 2019-12-04 ENCOUNTER — Telehealth: Payer: Self-pay

## 2019-12-04 ENCOUNTER — Encounter: Payer: Self-pay | Admitting: Obstetrics and Gynecology

## 2019-12-04 NOTE — Addendum Note (Signed)
Addended by: Tobi Bastos on: 12/04/2019 04:40 PM   Modules accepted: Orders

## 2019-12-04 NOTE — Telephone Encounter (Signed)
Rasheka, Denard [facility] Clinical Pool  Phone Number: [phone]  Hi,  I spoke with someone with [facility] mail order and the estrogen ring would be $400. Can I get tablets? The cheapest seems to be estrogen/Norethindrone. I've looked this up on GoodRx and I would prefer that be sent to [facility] [employer].

## 2019-12-04 NOTE — Telephone Encounter (Addendum)
Please make sure the patient understands that she needs to pull the old ring prior to inserting a new one.

## 2019-12-04 NOTE — Telephone Encounter (Signed)
Spoke to [place] at [facility] verbal orders on instructions for Estring Rx.  Verbal instructions given per Dr Oscar La Rx.  Addendum closed.

## 2019-12-05 ENCOUNTER — Encounter: Payer: Self-pay | Admitting: Obstetrics and Gynecology

## 2019-12-05 MED ORDER — ESTRADIOL 10 MCG VA TABS: 1.0000 | ORAL_TABLET | VAGINAL | 4 refills | Status: DC

## 2019-12-05 NOTE — Telephone Encounter (Signed)
Spoke to pt. Pt given update on Rx. Pt verbalized understanding and is agreeable.   Encounter closed.

## 2019-12-05 NOTE — Telephone Encounter (Signed)
[facility] 11/30/19 Vaginal atrophy  Spoke with pt. Pt states tried using [facility] for [employer] per [employer] and has pharmacy deductible and is cost prohibitive. Pt requesting  to use estradiol vaginal tablets if recommended by Dr Oscar La.   Advised will review with Dr Oscar La and return call to pt. Pt agreeable.  Pharmacy verified [facility] at [facility].   Routing to Dr Oscar La.

## 2019-12-05 NOTE — Telephone Encounter (Signed)
Estradiol tablets called in.

## 2019-12-06 ENCOUNTER — Telehealth: Payer: Self-pay

## 2019-12-06 NOTE — Telephone Encounter (Signed)
Spoke with pt. Pt requesting oral estrogen tablets due to cost prohibitive. Pt advised per Dr Oscar La that these are used systemically and would be not recommended for her. Pt agreeable and verbalized understanding. Pt advised to call [facility] to possibly use mail order pharmacy. Pt agreeable. Pt states will either use mail order pharmacy or retail pharmacy. Pt to return call if needs updated Rx sent to [facility]. Pt agreeable.   Routing to Dr Oscar La for review Encounter closed.

## 2019-12-06 NOTE — Telephone Encounter (Signed)
Chelsea Gross, [place] [facility] Clinical Pool  Phone Number: [phone]  Hi,  I went to pick up the estrogen tablets but they were $126. They were the inserts. The oral tablets are $13 if I use GoodRx. Can this prescription be changed to oral tablets? Thanks.

## 2019-12-06 NOTE — Telephone Encounter (Signed)
Left message for pt to return call to triage RN. 

## 2019-12-12 ENCOUNTER — Encounter: Payer: Self-pay | Admitting: Family Medicine

## 2020-01-18 ENCOUNTER — Ambulatory Visit: Payer: 59 | Admitting: Cardiology

## 2020-01-19 ENCOUNTER — Ambulatory Visit: Payer: 59 | Admitting: Cardiology

## 2020-02-02 ENCOUNTER — Encounter: Payer: Self-pay | Admitting: Obstetrics and Gynecology

## 2020-02-02 ENCOUNTER — Telehealth: Payer: Self-pay | Admitting: Obstetrics and Gynecology

## 2020-02-02 DIAGNOSIS — N939 Abnormal uterine and vaginal bleeding, unspecified: Secondary | ICD-10-CM

## 2020-02-02 NOTE — Telephone Encounter (Signed)
Chelsea Gross  P [facility] Clinical Pool Hi,  I've been on estradiol inserts for about two months. Last Friday, July 2, I started my period after almost a year. Its been pretty heavy but slowing down yesterday but I still have it. I had a slight headache before starting last Friday and cramping on and off all week. Is this something I should be concerned about or is this normal for menopause? Would the estrogen inserts have anything to do with it? Thanks for your help.

## 2020-02-02 NOTE — Telephone Encounter (Signed)
Left message for pt to return call to triage RN. 

## 2020-02-05 NOTE — Telephone Encounter (Signed)
Spoke with pt. Pt given recommendations per Dr Oscar La. Pt agreeable to have PUS first and ok with having EMB/[facility] if needed.  Pt scheduled for PUS on 02/20/20 at 330 pm with OV consult to follow with Dr Oscar La. Pt agreeable and verbalized understanding of date and time of appt. 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  Orders placed for PUS with possible EMB.   Routing to Dr Oscar La  Cc: Hedda Slade for precert  Encounter closed.

## 2020-02-05 NOTE — Telephone Encounter (Signed)
Left message to call Noreene Larsson, RN at [facility] [phone].   Last AEX 11/30/19

## 2020-02-05 NOTE — Telephone Encounter (Signed)
AEX 11/2019 Perimenopausal  Spoke with pt. Pt states having a cycle that started on 7/2-7/9. Pt describes as regular flow menses with changing a pad three times a day. Pt states had HA and cramps with cycle. Denies heavy bleeding or clots. Cramps were resolved with OTC meds.  Pt states has not had cycle in 10 months. Pt states was started on estradiol vag tablets in May at [facility] by Dr Oscar La. Pt states this was for vaginal dryness. Pt states that has improved. Pt states stopped taking the estradiol tablets 3.5 weeks ago.  Pt is perimenopausal and denies all vasomotor sx at this time.  Pt called to give Dr Oscar La an update and for any advice or recommendations. Pt declines OV at this time. Pt advised to continue to monitor and calendar cycles and return call to office with any heavy bleeding, clots, or any worsening vasomotor sx. Pt agreeable.   Pt advised will give Dr Oscar La update and return call with any further recommendations or advice.   Routing to Dr Oscar La.

## 2020-02-05 NOTE — Telephone Encounter (Signed)
A biopsy from a year ago can't explain bleeding from now. A lot can change in one year. She needs evaluation one way or another, if she prefers she can come in for an ultrasound, possible sonohysterogram. If that's normal we can hold off on a biopsy unless she bleeds again.

## 2020-02-05 NOTE — Telephone Encounter (Signed)
Spoke with pt. Pt given update from Dr Oscar La. Pt states had EMB 12/2018.   Chelsea Bolk, MD  12/29/2018 5:21 PM EDT Back to Top    Results and any recommendations were sent via MyChart.   Hi Chelsea Gross, Your endometrial biopsy is benign. It does show continued hormonal stimulation. This goes along with you being perimenopausal. I would recommend that you take there provera as prescribed. Please call with any questions or concerns. Have a great night! Chelsea Gross    Pt wanting to know if needs repeated or something else?  Advised will review with Dr Oscar La and return call. Pt agreeable.   Routing to Dr Oscar La.

## 2020-02-05 NOTE — Telephone Encounter (Signed)
Patient is returning call.  °

## 2020-02-05 NOTE — Telephone Encounter (Signed)
Patient returned a call to Stephanie. 

## 2020-02-05 NOTE — Telephone Encounter (Signed)
Given that she has gone so long without a cycle and then bleed, I would recommend that she be further evaluated. I would recommend that she come in for an endometrial biopsy.

## 2020-02-20 ENCOUNTER — Other Ambulatory Visit: Payer: Self-pay

## 2020-02-20 ENCOUNTER — Ambulatory Visit (INDEPENDENT_AMBULATORY_CARE_PROVIDER_SITE_OTHER): Payer: 59

## 2020-02-20 ENCOUNTER — Encounter: Payer: Self-pay | Admitting: Obstetrics and Gynecology

## 2020-02-20 ENCOUNTER — Ambulatory Visit (INDEPENDENT_AMBULATORY_CARE_PROVIDER_SITE_OTHER): Payer: 59 | Admitting: Obstetrics and Gynecology

## 2020-02-20 VITALS — BP 120/76 | HR 99 | Ht 64.0 in

## 2020-02-20 DIAGNOSIS — N939 Abnormal uterine and vaginal bleeding, unspecified: Secondary | ICD-10-CM | POA: Diagnosis not present

## 2020-02-20 DIAGNOSIS — N950 Postmenopausal bleeding: Secondary | ICD-10-CM | POA: Diagnosis not present

## 2020-02-20 DIAGNOSIS — R9389 Abnormal findings on diagnostic imaging of other specified body structures: Secondary | ICD-10-CM | POA: Diagnosis not present

## 2020-02-20 NOTE — Progress Notes (Signed)
GYNECOLOGY  VISIT   HPI: 54 y.o.   Married White or Caucasian Not Hispanic or Latino  female   G1P1001 with No LMP recorded. (Menstrual status: Perimenopausal).   here for Ultrasound consult for PMP bleeding.  She was evaluated in 6/20 for AUB, ultrasound with endometrial stripe of 4.61 mm. Endometrial biopsy showed proliferative endometrium. She was prescribed cyclic provera. At the time of her annual exam in 5/21 she reported no menses for ~1 year. She then called on 02/02/20 c/o having a heavy period. Her last FSH from 12/22/18 returned at 61.3.  GYNECOLOGIC HISTORY: No LMP recorded. (Menstrual status: Perimenopausal). Contraception:NA  Menopausal hormone therapy: estradiol tab 10mg          OB History    Gravida  1   Para  1   Term  1   Preterm      AB      Living  1     SAB      TAB      Ectopic      Multiple      Live Births  1              Patient Active Problem List   Diagnosis Date Noted  . Palpitations 10/13/2019  . Chest discomfort 10/13/2019  . Generalized anxiety disorder 09/21/2018  . Controlled substance agreement signed 09/21/2018  . Depression 04/16/2016  . Hyperlipidemia 04/16/2016  . Hypothyroidism 04/16/2016    Past Medical History:  Diagnosis Date  . Anxiety   . Controlled substance agreement signed 09/21/2018  . Depression 04/16/2016  . Generalized anxiety disorder 09/21/2018  . Hyperlipemia   . Hyperlipidemia 04/16/2016  . Hypothyroid   . Hypothyroidism 04/16/2016  . Thyroid disease   . Urinary incontinence     Past Surgical History:  Procedure Laterality Date  . chin and lip surgery  2001  . labial repair  2000    Current Outpatient Medications  Medication Sig Dispense Refill  . ALPRAZolam (XANAX) 0.25 MG tablet Take 1 tablet (0.25 mg total) by mouth 3 (three) times daily as needed for anxiety. 90 tablet 5  . atorvastatin (LIPITOR) 40 MG tablet Take 1 tablet (40 mg total) by mouth daily at 6 PM. 30 tablet 12  . DULoxetine  (CYMBALTA) 60 MG capsule Take 1 capsule (60 mg total) by mouth daily. 90 capsule 3  . Estradiol 10 MCG TABS vaginal tablet Place 1 tablet (10 mcg total) vaginally 2 (two) times a week. 24 tablet 4  . levothyroxine (SYNTHROID) 25 MCG tablet Take 1 tablet (25 mcg total) by mouth daily before breakfast. 90 tablet 1  . metroNIDAZOLE (METROGEL) 0.75 % gel Apply 1 application topically 2 (two) times daily. For rosacea 45 g 12  . omeprazole (PRILOSEC) 20 MG capsule Take 1 capsule (20 mg total) by mouth daily. 90 capsule 3  . valACYclovir (VALTREX) 500 MG tablet Take 1 tablet (500 mg total) by mouth daily. 30 tablet 12   No current facility-administered medications for this visit.     ALLERGIES: Sulfa antibiotics and Pantoprazole  Family History  Problem Relation Age of Onset  . Heart attack Mother   . Esophageal cancer Mother   . Heart attack Father   . Hyperlipidemia Sister   . Thyroid disease Sister   . Hyperlipidemia Sister   . Hyperlipidemia Sister   . Hyperlipidemia Sister     Social History   Socioeconomic History  . Marital status: Married    Spouse name: Not on file  .  Number of children: Not on file  . Years of education: Not on file  . Highest education level: Not on file  Occupational History  . Occupation: CMA  Tobacco Use  . Smoking status: Never Smoker  . Smokeless tobacco: Never Used  Vaping Use  . Vaping Use: Never used  Substance and Sexual Activity  . Alcohol use: No    Alcohol/week: 0.0 standard drinks  . Drug use: Yes    Comment: cannabis pills or gummies rarely  . Sexual activity: Yes    Partners: Male    Birth control/protection: None  Other Topics Concern  . Not on file  Social History Narrative  . Not on file   Social Determinants of Health   Financial Resource Strain:   . Difficulty of Paying Living Expenses:   Food Insecurity:   . Worried About Programme researcher, broadcasting/film/video in the Last Year:   . Barista in the Last Year:   Transportation  Needs:   . Freight forwarder (Medical):   Marland Kitchen Lack of Transportation (Non-Medical):   Physical Activity:   . Days of Exercise per Week:   . Minutes of Exercise per Session:   Stress:   . Feeling of Stress :   Social Connections:   . Frequency of Communication with Friends and Family:   . Frequency of Social Gatherings with Friends and Family:   . Attends Religious Services:   . Active Member of Clubs or Organizations:   . Attends Banker Meetings:   Marland Kitchen Marital Status:   Intimate Partner Violence:   . Fear of Current or Ex-Partner:   . Emotionally Abused:   Marland Kitchen Physically Abused:   . Sexually Abused:     Review of Systems  All other systems reviewed and are negative.   PHYSICAL EXAMINATION:    BP 120/76   Pulse 99   Ht 5\' 4"  (1.626 m)   SpO2 100%     General appearance: alert, cooperative and appears stated age  Ultrasound images reviewed with the patient. The endometrial stripe is thickened at 5.2 mm with an echogenic 9 mm focus with a feeder vessel. Single 14 mm intramural myoma, ovaries with small sparse follicles.   ASSESSMENT PMP female with bleeding, ultrasound suspicious for endometrial polyp. Discussed option of sonohysterogram or proceeding with hysteroscopy, D&C    PLAN The patient desires to proceed with hysteroscopy, D&C. Reviewed the procedure, images reviewed and handouts given. The surgery and recovery time were reviewed.    In addition to reviewing the ultrasound results, over 10 minutes was spent in total patient care in management

## 2020-02-22 ENCOUNTER — Encounter: Payer: Self-pay | Admitting: Obstetrics and Gynecology

## 2020-02-27 ENCOUNTER — Telehealth: Payer: Self-pay | Admitting: Obstetrics and Gynecology

## 2020-02-27 NOTE — Telephone Encounter (Signed)
Patient is calling to talk with the surgery scheduler. She states someone was suppose to call her to get scheduled.

## 2020-02-29 NOTE — Telephone Encounter (Signed)
Spoke with patient regarding benefits for recommended surgery. Patient stated that she has new insurance as of a few days ago and would call back with updated insurance information.

## 2020-03-11 ENCOUNTER — Telehealth: Payer: Self-pay | Admitting: Obstetrics and Gynecology

## 2020-03-11 ENCOUNTER — Encounter: Payer: Self-pay | Admitting: Obstetrics and Gynecology

## 2020-03-11 NOTE — Telephone Encounter (Signed)
See open encounter dated 02/27/20.   Encounter closed.

## 2020-03-11 NOTE — Telephone Encounter (Signed)
Chelsea Gross  P [facility] Clinical Pool I'm beginning to bleed again this month. See my last email. I was wondering if I should be concerned.

## 2020-03-11 NOTE — Telephone Encounter (Signed)
Mayford Knife, Greta L 38 minutes ago (9:51 AM)  GW   Reed Pandy, Lynn Ito  P [facility] Clinical Pool I'm beginning to bleed again this month. See my last email. I was wondering if I should be concerned.

## 2020-03-11 NOTE — Telephone Encounter (Signed)
Spoke with patient. Patient would like to proceed with scheduling Hysteroscopy D&C. Surgery scheduled for 04/23/2020 at 0730 at [facility]. Pre op scheduled for 04/11/2020 at 2:45 pm with Dr.Jertson. COVID test scheduled for 04/19/2020 at 3:10 pm at [place] location. Patient is aware of the need to quarantine after test until surgery. 2 week post op scheduled for 05/09/2020 at 4 pm with Dr.Jertson. Surgery instructions reviewed and mailed to patient's verified home address on file.  Routing to provider and will close encounter.

## 2020-03-11 NOTE — Telephone Encounter (Signed)
Spoke with patient regarding surgery benefits. Patient acknowledges understanding of information presented. Patient is aware that benefits presented are for professional benefits only. Patient is aware that once surgery is scheduled, the hospital will call with separate benefits.   Patient has multiple clinical questions regarding her options. Call transferred to [facility] to answer patient's questions before proceeding with scheduling.

## 2020-03-11 NOTE — Telephone Encounter (Signed)
Spoke with patient. Patient reports spotting that started this morning. Denies heavy bleeding or pain. Patient was seen in office on 02/20/20 for PUS for PMB, hysteroscopy, D&C was discussed, patient request to proceed.   Confirmed insurance on file is most recent and up to date.   Advised I will forward to [facility] for benefits and Kaitlyn Sprague to discuss surgery scheduling. Advised patient to contact the office if bleeding becomes heavy or any new symptoms develop. Patient agreeable.   Routing to [employer].   Cc: Nolen Mu, RN

## 2020-04-11 ENCOUNTER — Encounter: Payer: Self-pay | Admitting: Obstetrics and Gynecology

## 2020-04-11 ENCOUNTER — Ambulatory Visit (INDEPENDENT_AMBULATORY_CARE_PROVIDER_SITE_OTHER): Payer: 59 | Admitting: Obstetrics and Gynecology

## 2020-04-11 ENCOUNTER — Other Ambulatory Visit: Payer: Self-pay

## 2020-04-11 VITALS — BP 142/80 | HR 100 | Resp 12 | Ht 64.0 in

## 2020-04-11 DIAGNOSIS — N950 Postmenopausal bleeding: Secondary | ICD-10-CM

## 2020-04-11 DIAGNOSIS — R9389 Abnormal findings on diagnostic imaging of other specified body structures: Secondary | ICD-10-CM

## 2020-04-11 NOTE — Progress Notes (Signed)
GYNECOLOGY  VISIT   HPI: 54 y.o.   Married White or Caucasian Not Hispanic or Latino  female   G1P1001 with Patient's last menstrual period was 01/25/2020 (approximate).   here for a pre-op visit. She was seen at the end of July with postmenopausal bleeding. Ultrasound was suspicious for an endometrial polyp. We discussed the option of sonohysterogram or proceeding with hysteroscopy, she preferred to proceed with hysteroscopy.      GYNECOLOGIC HISTORY: Patient's last menstrual period was 01/25/2020 (approximate). Contraception:None  Menopausal hormone therapy: NA        OB History    Gravida  1   Para  1   Term  1   Preterm      AB      Living  1     SAB      TAB      Ectopic      Multiple      Live Births  1              Patient Active Problem List   Diagnosis Date Noted  . Palpitations 10/13/2019  . Chest discomfort 10/13/2019  . Generalized anxiety disorder 09/21/2018  . Controlled substance agreement signed 09/21/2018  . Depression 04/16/2016  . Hyperlipidemia 04/16/2016  . Hypothyroidism 04/16/2016    Past Medical History:  Diagnosis Date  . Anxiety   . Controlled substance agreement signed 09/21/2018  . Depression 04/16/2016  . Generalized anxiety disorder 09/21/2018  . Hyperlipemia   . Hyperlipidemia 04/16/2016  . Hypothyroid   . Hypothyroidism 04/16/2016  . Thyroid disease   . Urinary incontinence     Past Surgical History:  Procedure Laterality Date  . chin and lip surgery  2001  . labial repair  2000    Current Outpatient Medications  Medication Sig Dispense Refill  . ALPRAZolam (XANAX) 0.25 MG tablet Take 1 tablet (0.25 mg total) by mouth 3 (three) times daily as needed for anxiety. 90 tablet 5  . atorvastatin (LIPITOR) 40 MG tablet Take 1 tablet (40 mg total) by mouth daily at 6 PM. 30 tablet 12  . DULoxetine (CYMBALTA) 60 MG capsule Take 1 capsule (60 mg total) by mouth daily. 90 capsule 3  . Estradiol 10 MCG TABS vaginal tablet  Place 1 tablet (10 mcg total) vaginally 2 (two) times a week. 24 tablet 4  . levothyroxine (SYNTHROID) 25 MCG tablet Take 1 tablet (25 mcg total) by mouth daily before breakfast. 90 tablet 1  . metroNIDAZOLE (METROGEL) 0.75 % gel Apply 1 application topically 2 (two) times daily. For rosacea 45 g 12  . omeprazole (PRILOSEC) 20 MG capsule Take 1 capsule (20 mg total) by mouth daily. 90 capsule 3  . valACYclovir (VALTREX) 500 MG tablet Take 1 tablet (500 mg total) by mouth daily. 30 tablet 12   No current facility-administered medications for this visit.     ALLERGIES: Sulfa antibiotics and Pantoprazole  Family History  Problem Relation Age of Onset  . Heart attack Mother   . Esophageal cancer Mother   . Heart attack Father   . Hyperlipidemia Sister   . Thyroid disease Sister   . Hyperlipidemia Sister   . Hyperlipidemia Sister   . Hyperlipidemia Sister     Social History   Socioeconomic History  . Marital status: Married    Spouse name: Not on file  . Number of children: Not on file  . Years of education: Not on file  . Highest education level: Not on file  Occupational History  . Occupation: CMA  Tobacco Use  . Smoking status: Never Smoker  . Smokeless tobacco: Never Used  Vaping Use  . Vaping Use: Never used  Substance and Sexual Activity  . Alcohol use: No    Alcohol/week: 0.0 standard drinks  . Drug use: Yes    Comment: cannabis pills or gummies rarely  . Sexual activity: Yes    Partners: Male    Birth control/protection: None  Other Topics Concern  . Not on file  Social History Narrative  . Not on file   Social Determinants of Health   Financial Resource Strain:   . Difficulty of Paying Living Expenses: Not on file  Food Insecurity:   . Worried About Programme researcher, broadcasting/film/video in the Last Year: Not on file  . Ran Out of Food in the Last Year: Not on file  Transportation Needs:   . Lack of Transportation (Medical): Not on file  . Lack of Transportation  (Non-Medical): Not on file  Physical Activity:   . Days of Exercise per Week: Not on file  . Minutes of Exercise per Session: Not on file  Stress:   . Feeling of Stress : Not on file  Social Connections:   . Frequency of Communication with Friends and Family: Not on file  . Frequency of Social Gatherings with Friends and Family: Not on file  . Attends Religious Services: Not on file  . Active Member of Clubs or Organizations: Not on file  . Attends Banker Meetings: Not on file  . Marital Status: Not on file  Intimate Partner Violence:   . Fear of Current or Ex-Partner: Not on file  . Emotionally Abused: Not on file  . Physically Abused: Not on file  . Sexually Abused: Not on file    Review of Systems  Endo/Heme/Allergies: Negative.   All other systems reviewed and are negative.   PHYSICAL EXAMINATION:    BP (!) 142/80 (BP Location: Left Arm, Patient Position: Sitting, Cuff Size: Normal)   Pulse 100   Resp 12   Ht 5\' 4"  (1.626 m)   LMP 01/25/2020 (Approximate)   SpO2 99%     General appearance: alert, cooperative and appears stated age Neck: no adenopathy, supple, symmetrical, trachea midline and thyroid normal to inspection and palpation Heart: regular rate and rhythm Lungs: CTAB Abdomen: soft, non-tender; bowel sounds normal; no masses,  no organomegaly Extremities: normal, atraumatic, no cyanosis Skin: normal color, texture and turgor, no rashes or lesions Lymph: normal cervical supraclavicular and inguinal nodes Neurologic: grossly normal   ASSESSMENT Postmenopausal bleeding Ultrasound suspicious for endometrial polyp    PLAN Plan: hysteroscopy, possible polypectomy, dilation and curettage. Reviewed risks, including: bleeding, infection, uterine perforation, fluid overload, need for further sugery

## 2020-04-17 ENCOUNTER — Other Ambulatory Visit: Payer: Self-pay

## 2020-04-17 ENCOUNTER — Encounter (HOSPITAL_COMMUNITY): Payer: Self-pay | Admitting: Physician Assistant

## 2020-04-17 ENCOUNTER — Encounter (HOSPITAL_BASED_OUTPATIENT_CLINIC_OR_DEPARTMENT_OTHER): Payer: Self-pay | Admitting: Obstetrics and Gynecology

## 2020-04-17 NOTE — Progress Notes (Addendum)
Addendum: spoke with jessica zanetto pa and pt needs cardiac clearance from dr Tomie China for 04-23-2020 surgery, called and left voicemail message for caitlyn surgery scheduler pt needs cardiac clearance from dr Tomie China for 04-23-2020 surgery  Spoke w/ via phone for pre-op interview---PT Lab needs dos---- NONE              COVID test ------04-19-2020 1510 Arrive at -------530 AM 04-23-2020 NPO after MN NO Solid Food.  Clear liquids from MN until---430 AM THEN NPO Medications to take morning of surgery -alprazolam prn, duloxetine, atorvastatin, levothyroxine, omeprazole Diabetic medication -----n/a Patient Special Instructions -----none Pre-Op special Istructions -----none Patient verbalized understanding of instructions that were given at this phone interview. Patient denies shortness of breath, chest pain, fever, cough at this phone interview.  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

## 2020-04-19 ENCOUNTER — Telehealth: Payer: Self-pay | Admitting: Cardiology

## 2020-04-19 ENCOUNTER — Other Ambulatory Visit (HOSPITAL_COMMUNITY)
Admission: RE | Admit: 2020-04-19 | Discharge: 2020-04-19 | Disposition: A | Discharge status: Home / Self Care | Payer: 59 | Source: Ambulatory Visit | Attending: Obstetrics and Gynecology | Admitting: Obstetrics and Gynecology

## 2020-04-19 ENCOUNTER — Telehealth: Payer: Self-pay

## 2020-04-19 DIAGNOSIS — Z01812 Encounter for preprocedural laboratory examination: Secondary | ICD-10-CM | POA: Diagnosis present

## 2020-04-19 DIAGNOSIS — Z20822 Contact with and (suspected) exposure to covid-19: Secondary | ICD-10-CM | POA: Insufficient documentation

## 2020-04-19 LAB — SARS CORONAVIRUS 2 (TAT 6-24 HRS): SARS Coronavirus 2: NEGATIVE

## 2020-04-19 NOTE — Progress Notes (Signed)
Left message with Otho Ket pt needs cardiac clearance from dr Tomie China for 04-23-20 surgery, lm for [facility] to call back and update me with status of getting cardiac clearance

## 2020-04-19 NOTE — Telephone Encounter (Signed)
Received phone call from [facility] that patient will need cardiac clearance from Dr.Revankar. Call to Dr.Revankar's office- surgery clearance note placed in chart and sent to their surgery clearance team.

## 2020-04-19 NOTE — Telephone Encounter (Signed)
   [facility] Pre-operative Risk Assessment    HEARTCARE STAFF: - Please ensure there is not already an duplicate clearance open for this procedure. - Under Visit Info/Reason for Call, type in Other and utilize the format Clearance MM/DD/YY or Clearance TBD. Do not use dashes or single digits. - If request is for dental extraction, please clarify the # of teeth to be extracted.  Request for surgical clearance:  1. What type of surgery is being performed? Hysteroscopy [place] 2.   3. When is this surgery scheduled? 04/23/20  4. What type of clearance is required (medical clearance vs. Pharmacy clearance to hold med vs. Both)? both  5. Are there any medications that need to be held prior to surgery and how long? She doesn't believe so, pt not on blood thinners  6. Practice name and name of physician performing surgery? Dr. Sumner Boast at [facility]  7. What is the office phone number? [phone]   7.   What is the office fax number? [phone]  8.   Anesthesia type (None, local, MAC, general) ? Choice   Leah Newnam 04/19/2020, 1:44 PM  _________________________________________________________________   (provider comments below)

## 2020-04-19 NOTE — Telephone Encounter (Signed)
Primary Cardiologist: Revankar  Chart reviewed as part of pre-operative protocol coverage. Because of Chelsea Gross's past medical history and time since last visit, he/she will require a follow-up visit in order to better assess preoperative cardiovascular risk.  Pre-op covering staff: - Please schedule appointment and call patient to inform them. - Please contact requesting surgeon's office via preferred method (i.e, phone, fax) to inform them of need for appointment prior to surgery.  If applicable, this message will also be routed to pharmacy pool and/or primary cardiologist for input on holding anticoagulant/antiplatelet agent as requested below so that this information is available at time of patient's appointment.   Joni Reining, NP  04/19/2020, 1:56 PM

## 2020-04-19 NOTE — Telephone Encounter (Signed)
Per Dr.Revankar's office patient will need to be seen for clearance for surgery. Spoke with Dr.Revankar's office scheduled patient an appointment for 05/10/2020 at 9 am as this is the earliest available appointment. Spoke with the patient and advised of all information. Patient verbalizes understanding. Advised surgery will have to be cancelled and rescheduled after she has received cardiac clearance. Patient verbalizes understanding.  Spoke with centralized scheduling and cancelled patient's surgery for 04/23/2020.  Routing to provider and will close encounter.

## 2020-04-19 NOTE — Telephone Encounter (Signed)
I will send a message to [facility] office to see if they can get the pt in ASAP as surgery is scheduled for 04/23/20.

## 2020-04-22 NOTE — Telephone Encounter (Signed)
Pt has been scheduled to see Dr. Tomie China 05/10/20 for pre op, assessment. I will send FYI to requesting office as procedure will need to be postponed until assessed by cardiologist.

## 2020-04-23 ENCOUNTER — Ambulatory Visit (HOSPITAL_BASED_OUTPATIENT_CLINIC_OR_DEPARTMENT_OTHER): Admission: RE | Admit: 2020-04-23 | Payer: 59 | Source: Home / Self Care | Admitting: Obstetrics and Gynecology

## 2020-04-23 HISTORY — DX: Personal history of other specified conditions: Z87.898

## 2020-04-23 HISTORY — DX: Gastro-esophageal reflux disease without esophagitis: K21.9

## 2020-04-23 HISTORY — DX: Postmenopausal bleeding: N95.0

## 2020-04-23 SURGERY — DILATATION & CURETTAGE/HYSTEROSCOPY WITH MYOSURE
Anesthesia: Choice

## 2020-04-26 ENCOUNTER — Ambulatory Visit: Payer: 59 | Admitting: Cardiology

## 2020-05-01 ENCOUNTER — Ambulatory Visit: Payer: 59 | Admitting: Cardiology

## 2020-05-06 ENCOUNTER — Encounter: Payer: Self-pay | Admitting: Obstetrics and Gynecology

## 2020-05-06 ENCOUNTER — Telehealth: Payer: Self-pay

## 2020-05-06 NOTE — Telephone Encounter (Signed)
Patient sent the following message via MyChart.  I had a confirmation call for an appointment on Thurs, October 14. It was originally supposed to be my follow up for the procedure that had to be postponed. Please cancel this appointment. Also, I haven't heard from anyone from your office about rescheduling my hysteroscopy and d&c.  Post-op appointment cancelled. Routing to [place] regarding surgery rescheduling.

## 2020-05-08 DIAGNOSIS — F419 Anxiety disorder, unspecified: Secondary | ICD-10-CM | POA: Insufficient documentation

## 2020-05-08 DIAGNOSIS — Z87898 Personal history of other specified conditions: Secondary | ICD-10-CM | POA: Insufficient documentation

## 2020-05-08 DIAGNOSIS — E785 Hyperlipidemia, unspecified: Secondary | ICD-10-CM | POA: Insufficient documentation

## 2020-05-08 DIAGNOSIS — N950 Postmenopausal bleeding: Secondary | ICD-10-CM | POA: Insufficient documentation

## 2020-05-08 DIAGNOSIS — E039 Hypothyroidism, unspecified: Secondary | ICD-10-CM | POA: Insufficient documentation

## 2020-05-08 DIAGNOSIS — K219 Gastro-esophageal reflux disease without esophagitis: Secondary | ICD-10-CM | POA: Insufficient documentation

## 2020-05-08 DIAGNOSIS — E079 Disorder of thyroid, unspecified: Secondary | ICD-10-CM | POA: Insufficient documentation

## 2020-05-08 DIAGNOSIS — R32 Unspecified urinary incontinence: Secondary | ICD-10-CM | POA: Insufficient documentation

## 2020-05-09 ENCOUNTER — Ambulatory Visit: Payer: 59 | Admitting: Obstetrics and Gynecology

## 2020-05-10 ENCOUNTER — Encounter: Payer: Self-pay | Admitting: Cardiology

## 2020-05-10 ENCOUNTER — Ambulatory Visit (INDEPENDENT_AMBULATORY_CARE_PROVIDER_SITE_OTHER): Payer: 59 | Admitting: Cardiology

## 2020-05-10 ENCOUNTER — Other Ambulatory Visit: Payer: Self-pay

## 2020-05-10 ENCOUNTER — Ambulatory Visit: Payer: 59 | Admitting: Cardiology

## 2020-05-10 VITALS — BP 112/72 | HR 115 | Resp 97 | Ht 64.0 in | Wt 165.0 lb

## 2020-05-10 DIAGNOSIS — R000 Tachycardia, unspecified: Secondary | ICD-10-CM | POA: Insufficient documentation

## 2020-05-10 DIAGNOSIS — Z01810 Encounter for preprocedural cardiovascular examination: Secondary | ICD-10-CM

## 2020-05-10 DIAGNOSIS — Z87898 Personal history of other specified conditions: Secondary | ICD-10-CM

## 2020-05-10 DIAGNOSIS — E782 Mixed hyperlipidemia: Secondary | ICD-10-CM

## 2020-05-10 DIAGNOSIS — I4711 Inappropriate sinus tachycardia, so stated: Secondary | ICD-10-CM

## 2020-05-10 HISTORY — DX: Inappropriate sinus tachycardia, so stated: I47.11

## 2020-05-10 HISTORY — DX: Encounter for preprocedural cardiovascular examination: Z01.810

## 2020-05-10 HISTORY — DX: Tachycardia, unspecified: R00.0

## 2020-05-10 MED ORDER — IVABRADINE HCL 5 MG PO TABS: 2.5000 mg | ORAL_TABLET | Freq: Two times a day (BID) | ORAL | 2 refills | Status: DC

## 2020-05-10 NOTE — Patient Instructions (Addendum)
Medication Instructions:  Your physician has recommended you make the following change in your medication:   Take Corlanor 2.5 mg twice daily.  *If you need a refill on your cardiac medications before your next appointment, please call your pharmacy*   Lab Work: None ordered If you have labs (blood work) drawn today and your tests are completely normal, you will receive your results only by: Marland Kitchen MyChart Message (if you have MyChart) OR . A paper copy in the mail If you have any lab test that is abnormal or we need to change your treatment, we will call you to review the results.   Testing/Procedures: Your physician has requested that you have a lexiscan myoview. For further information please visit [email or web address] Please follow instruction sheet, as given.  The test will take approximately 3 to 4 hours to complete; you may bring reading material.  If someone comes with you to your appointment, they will need to remain in the main lobby due to limited space in the testing area. **If you are pregnant or breastfeeding, please notify the nuclear lab prior to your appointment**  How to prepare for your Myocardial Perfusion Test: . Do not eat or drink 3 hours prior to your test, except you may have water. . Do not consume products containing caffeine (regular or decaffeinated) 12 hours prior to your test. (ex: coffee, chocolate, sodas, tea). . Do bring a list of your current medications with you.  If not listed below, you may take your medications as normal. . Do wear comfortable clothes (no dresses or overalls) and walking shoes, tennis shoes preferred (No heels or open toe shoes are allowed). . Do NOT wear cologne, perfume, aftershave, or lotions (deodorant is allowed). . If these instructions are not followed, your test will have to be rescheduled.   Follow-Up: At [facility], you and your health needs are our priority.  As part of our continuing mission to provide you with  exceptional heart care, we have created designated Provider Care Teams.  These Care Teams include your primary Cardiologist (physician) and Advanced Practice Providers (APPs -  Physician Assistants and Nurse Practitioners) who all work together to provide you with the care you need, when you need it.  We recommend signing up for the patient portal called "MyChart".  Sign up information is provided on this After Visit Summary.  MyChart is used to connect with patients for Virtual Visits (Telemedicine).  Patients are able to view lab/test results, encounter notes, upcoming appointments, etc.  Non-urgent messages can be sent to your provider as well.   To learn more about what you can do with MyChart, go to [email or web address].    Your next appointment:   3 week(s)  The format for your next appointment:   In Person  Provider:   Belva Crome, MD   Other Instructions Ivabradine tablets What is this medicine? IVABRADINE (eye VAB ra deen) is used for heart failure. This medicine may be used for other purposes; ask your health care provider or pharmacist if you have questions. COMMON BRAND NAME(S): Corlanor What should I tell my health care provider before I take this medicine? They need to know if you have any of these conditions:  certain heart conditions like sick sinus syndrome, sinoatrial block, or third-degree atrioventricular block  heart failure that has recently worsened  liver disease  low blood pressure  low resting heart rate  pacemaker  an unusual or allergic reaction to ivabradine, other medicines, foods, dyes, or preservatives  pregnant or trying to get pregnant  breast-feeding How should I use this medicine? Take this medicine by mouth with a glass of water. Follow the directions on the prescription label. Take this medicine with food. Avoid grapefruit juice. Take your medicine at regular intervals. Do not take it more often than directed. Do not stop taking  except on your doctor's advice. Talk to your pediatrician regarding the use of this medicine in children. While this drug may be prescribed for children as young as 6 months for selected conditions, precautions do apply. Overdosage: If you think you have taken too much of this medicine contact a poison control center or emergency room at once. NOTE: This medicine is only for you. Do not share this medicine with others. What if I miss a dose? If you miss a dose, skip it. Take your next dose at the normal time. Do not take extra or 2 doses at the same time to make up for the missed dose. What may interact with this medicine? Do not take this medicine with any of the following medications:  certain medicines for fungal infections like ketoconazole, itraconazole, or posaconazole  certain antibiotics like clarithromycin and telithromycin  certain antivirals for HIV or hepatitis  ceritinib  conivaptan  idelalisib  nefazodone  ribociclib This medicine may also interact with the following medications:  certain medicines for blood pressure, heart disease, irregular heartbeat  certain medicines for seizures like phenobarbital and phenytoin  rifampicin  St. John's wort This list may not describe all possible interactions. Give your health care provider a list of all the medicines, herbs, non-prescription drugs, or dietary supplements you use. Also tell them if you smoke, drink alcohol, or use illegal drugs. Some items may interact with your medicine. What should I watch for while using this medicine? Visit your healthcare professional for regular checks on your progress. Tell your healthcare professional if your symptoms do not start to get better or if they get worse. You may experience changes in vision. Use caution if you are driving or using machinery when sudden changes in light intensity may occur, especially while driving at night. This effect may decrease after using this medicine  for a long time. Do not become pregnant while taking this medicine. Women should inform their healthcare professional if they wish to become pregnant or think they might be pregnant. There is a potential for serious side effects and harm to an unborn child. Talk to your health care professional for more information. What side effects may I notice from receiving this medicine? Side effects that you should report to your doctor or health care professional as soon as possible:  allergic reactions like skin rash, itching or hives, swelling of the face, lips, or tongue  high blood pressure  signs and symptoms of a dangerous change in heartbeat or heart rhythm like chest pain; dizziness; fast or irregular heartbeat; palpitations; feeling faint or lightheaded; breathing problems  unusually slow heartbeat Side effects that usually do not require medical attention (report to your doctor or health care professional if they continue or are bothersome):  changes in vision This list may not describe all possible side effects. Call your doctor for medical advice about side effects. You may report side effects to FDA at 1-800-FDA-1088. Where should I keep my medicine? Keep out of the reach of children. Store at room temperature between 20 and 25 degrees C (68 and 77 degrees F). Throw away any unused medicine after the expiration date. NOTE: This  sheet is a summary. It may not cover all possible information. If you have questions about this medicine, talk to your doctor, pharmacist, or health care provider.  2020 Elsevier/Gold Standard (2018-03-07 14:04:38)  Cardiac Nuclear Scan A cardiac nuclear scan is a test that is done to check the flow of blood to your heart. It is done when you are resting and when you are exercising. The test looks for problems such as:  Not enough blood reaching a portion of the heart.  The heart muscle not working as it should. You may need this test if:  You have heart  disease.  You have had lab results that are not normal.  You have had heart surgery or a balloon procedure to open up blocked arteries (angioplasty).  You have chest pain.  You have shortness of breath. In this test, a special dye (tracer) is put into your bloodstream. The tracer will travel to your heart. A camera will then take pictures of your heart to see how the tracer moves through your heart. This test is usually done at a hospital and takes 2-4 hours. Tell a doctor about:  Any allergies you have.  All medicines you are taking, including vitamins, herbs, eye drops, creams, and over-the-counter medicines.  Any problems you or family members have had with anesthetic medicines.  Any blood disorders you have.  Any surgeries you have had.  Any medical conditions you have.  Whether you are pregnant or may be pregnant. What are the risks? Generally, this is a safe test. However, problems may occur, such as:  Serious chest pain and heart attack. This is only a risk if the stress portion of the test is done.  Rapid heartbeat.  A feeling of warmth in your chest. This feeling usually does not last long.  Allergic reaction to the tracer. What happens before the test?  Ask your doctor about changing or stopping your normal medicines. This is important.  Follow instructions from your doctor about what you cannot eat or drink.  Remove your jewelry on the day of the test. What happens during the test?  An IV tube will be inserted into one of your veins.  Your doctor will give you a small amount of tracer through the IV tube.  You will wait for 20-40 minutes while the tracer moves through your bloodstream.  Your heart will be monitored with an electrocardiogram (ECG).  You will lie down on an exam table.  Pictures of your heart will be taken for about 15-20 minutes.  You may also have a stress test. For this test, one of these things may be done: ? You will be asked to  exercise on a treadmill or a stationary bike. ? You will be given medicines that will make your heart work harder. This is done if you are unable to exercise.  When blood flow to your heart has peaked, a tracer will again be given through the IV tube.  After 20-40 minutes, you will get back on the exam table. More pictures will be taken of your heart.  Depending on the tracer that is used, more pictures may need to be taken 3-4 hours later.  Your IV tube will be removed when the test is over. The test may vary among doctors and hospitals. What happens after the test?  Ask your doctor: ? Whether you can return to your normal schedule, including diet, activities, and medicines. ? Whether you should drink more fluids. This will help  to remove the tracer from your body. Drink enough fluid to keep your pee (urine) pale yellow.  Ask your doctor, or the department that is doing the test: ? When will my results be ready? ? How will I get my results? Summary  A cardiac nuclear scan is a test that is done to check the flow of blood to your heart.  Tell your doctor whether you are pregnant or may be pregnant.  Before the test, ask your doctor about changing or stopping your normal medicines. This is important.  Ask your doctor whether you can return to your normal activities. You may be asked to drink more fluids. This information is not intended to replace advice given to you by your health care provider. Make sure you discuss any questions you have with your health care provider. Document Revised: 11/02/2018 Document Reviewed: 12/27/2017 Elsevier Patient Education  2020 [employer].

## 2020-05-10 NOTE — Progress Notes (Signed)
Cardiology Office Note:    Date:  05/10/2020   ID:  Chelsea Gross, DOB [birth year], MRN [id number]  PCP:  Raliegh Ip, DO  Cardiologist:  Garwin Brothers, MD   Referring MD: Raliegh Ip, DO    ASSESSMENT:    1. Mixed hyperlipidemia   2. History of palpitations   3. Inappropriate sinus tachycardia   4. Preoperative cardiovascular examination    PLAN:    In order of problems listed above:  1. Primary prevention stressed with the patient.  Importance of compliance with diet medication stressed and she vocalized understanding. 2. EKG reveals sinus tachycardia.  Her blood pressure is borderline and therefore I will initiate her on Corlanor low-dose.  She will be back in the next 2 to 3 weeks and keep a track of her pulse and blood pressures.  I discussed this with her at length benefits and potential risks explained and she vocalized understanding. 3. Mixed dyslipidemia: On statin therapy and doing well.  Diet emphasized. 4. Preoperative risk stratification: First and foremost I would like to get her resting heart rate settled down.  I also would like to get a Lexiscan sestamibi in view of multiple risk factors and sedentary lifestyle.  If this is negative then she is not at high risk for the procedure.  Further recommendations will depend on the findings of this test. 5. She will be seen in follow-up appointment in 3 weeks or earlier if she has any concerns.  She had multiple questions which were answered to her satisfaction.   Medication Adjustments/Labs and Tests Ordered: Current medicines are reviewed at length with the patient today.  Concerns regarding medicines are outlined above.  Orders Placed This Encounter  Procedures  . MYOCARDIAL PERFUSION IMAGING  . EKG 12-Lead   Meds ordered this encounter  Medications  . ivabradine (CORLANOR) 5 MG TABS tablet    Sig: Take 0.5 tablets (2.5 mg total) by mouth 2 (two) times daily with a meal.    Dispense:  30  tablet    Refill:  2     No chief complaint on file.    History of Present Illness:    Chelsea Gross is a 54 y.o. female.  Patient has past medical history of sinus tachycardia which is inappropriate.  She had has thyroid problems but her recent TSH has been unremarkable.  She is planning to undergo hysteroscopy.  She denies any chest pain orthopnea or PND however she leads a sedentary lifestyle.  She has history of mixed dyslipidemia.  She is on statin therapy and lipids are well controlled.  At the time of my evaluation, the patient is alert awake oriented and in no distress.  Past Medical History:  Diagnosis Date  . Anxiety   . Chest discomfort 10/13/2019  . Controlled substance agreement signed 09/21/2018  . Depression 04/16/2016  . Generalized anxiety disorder 09/21/2018  . GERD (gastroesophageal reflux disease)   . History of palpitations    MARCH 2021  . Hyperlipemia   . Hyperlipidemia 04/16/2016  . Hypothyroid   . Hypothyroidism 04/16/2016  . Palpitations 10/13/2019  . PMB (postmenopausal bleeding)   . Thyroid disease   . Urinary incontinence     Past Surgical History:  Procedure Laterality Date  . chin and lip surgery  2001  . COLONSCOPY  2018   5 POLYPS REMOVED  . labial repair  2000    Current Medications: Current Meds  Medication Sig  . atorvastatin (LIPITOR) 40 MG  tablet Take 1 tablet (40 mg total) by mouth daily at 6 PM.  . DULoxetine (CYMBALTA) 60 MG capsule Take 1 capsule (60 mg total) by mouth daily.  Marland Kitchen levothyroxine (SYNTHROID) 25 MCG tablet Take 1 tablet (25 mcg total) by mouth daily before breakfast.  . metroNIDAZOLE (METROGEL) 0.75 % gel Apply 1 application topically 2 (two) times daily. For rosacea  . omeprazole (PRILOSEC) 20 MG capsule Take 1 capsule (20 mg total) by mouth daily.  . valACYclovir (VALTREX) 500 MG tablet Take 1 tablet (500 mg total) by mouth daily.     Allergies:   Sulfa antibiotics, Wellbutrin [bupropion], and Pantoprazole    Social History   Socioeconomic History  . Marital status: Married    Spouse name: Not on file  . Number of children: Not on file  . Years of education: Not on file  . Highest education level: Not on file  Occupational History  . Occupation: CMA  Tobacco Use  . Smoking status: Never Smoker  . Smokeless tobacco: Never Used  Vaping Use  . Vaping Use: Never used  Substance and Sexual Activity  . Alcohol use: Yes    Alcohol/week: 0.0 standard drinks    Comment: RARE  . Drug use: Yes    Comment: cannabis pills or gummies rarely LAST USED  2020 PER PT  . Sexual activity: Yes    Partners: Male    Birth control/protection: None  Other Topics Concern  . Not on file  Social History Narrative  . Not on file   Social Determinants of Health   Financial Resource Strain:   . Difficulty of Paying Living Expenses: Not on file  Food Insecurity:   . Worried About Programme researcher, broadcasting/film/video in the Last Year: Not on file  . Ran Out of Food in the Last Year: Not on file  Transportation Needs:   . Lack of Transportation (Medical): Not on file  . Lack of Transportation (Non-Medical): Not on file  Physical Activity:   . Days of Exercise per Week: Not on file  . Minutes of Exercise per Session: Not on file  Stress:   . Feeling of Stress : Not on file  Social Connections:   . Frequency of Communication with Friends and Family: Not on file  . Frequency of Social Gatherings with Friends and Family: Not on file  . Attends Religious Services: Not on file  . Active Member of Clubs or Organizations: Not on file  . Attends Banker Meetings: Not on file  . Marital Status: Not on file     Family History: The patient's family history includes Esophageal cancer in her mother; Heart attack in her father and mother; Hyperlipidemia in her sister, sister, sister, and sister; Thyroid disease in her sister.  ROS:   Please see the history of present illness.    All other systems reviewed and  are negative.  EKGs/Labs/Other Studies Reviewed:    The following studies were reviewed today: I discussed my findings with the patient at length   Recent Labs: 11/27/2019: ALT 20; BUN 12; Creatinine, Ser 0.76; Potassium 4.7; Sodium 141; TSH 2.760  Recent Lipid Panel    Component Value Date/Time   CHOL 179 11/27/2019 1232   TRIG 165 (H) 11/27/2019 1232   HDL 50 11/27/2019 1232   CHOLHDL 3.6 11/27/2019 1232   LDLCALC 100 (H) 11/27/2019 1232    Physical Exam:    VS:  BP 112/72   Pulse (!) 115   Resp (!)  97   Ht 5\' 4"  (1.626 m)   Wt 165 lb (74.8 kg)   LMP 04/16/2020   BMI 28.32 kg/m     Wt Readings from Last 3 Encounters:  05/10/20 165 lb (74.8 kg)     GEN: Patient is in no acute distress HEENT: Normal NECK: No JVD; No carotid bruits LYMPHATICS: No lymphadenopathy CARDIAC: Hear sounds regular, 2/6 systolic murmur at the apex. RESPIRATORY:  Clear to auscultation without rales, wheezing or rhonchi  ABDOMEN: Soft, non-tender, non-distended MUSCULOSKELETAL:  No edema; No deformity  SKIN: Warm and dry NEUROLOGIC:  Alert and oriented x 3 PSYCHIATRIC:  Normal affect   Signed, 05/12/20, MD  05/10/2020 3:27 PM    [facility]

## 2020-05-13 MED ORDER — METOPROLOL SUCCINATE ER 25 MG PO TB24: 25.0000 mg | ORAL_TABLET | Freq: Every day | ORAL | 6 refills | Status: DC

## 2020-05-13 NOTE — Addendum Note (Signed)
Addended by: Eleonore Chiquito on: 05/13/2020 12:06 PM   Modules accepted: Orders

## 2020-05-16 ENCOUNTER — Other Ambulatory Visit: Payer: Self-pay | Admitting: Family Medicine

## 2020-05-16 ENCOUNTER — Telehealth: Payer: Self-pay | Admitting: *Deleted

## 2020-05-16 ENCOUNTER — Encounter: Payer: Self-pay | Admitting: Obstetrics and Gynecology

## 2020-05-16 ENCOUNTER — Telehealth (HOSPITAL_COMMUNITY): Payer: Self-pay | Admitting: Cardiology

## 2020-05-16 NOTE — Telephone Encounter (Signed)
Chelsea Gross  [facility] Clinical Pool Hi, I was wondering if my cardiologist, Dr, Tomie China had cleared me for the upcoming procedure that I'm supposed to have.   Routing to [employer], Charity fundraiser

## 2020-05-16 NOTE — Telephone Encounter (Signed)
See telephone encounter dated 05/16/20.  ° °Encounter closed.  °

## 2020-05-16 NOTE — Telephone Encounter (Signed)
I called patient to schedule Myoview and patient has declined to schedule at this time and will discuss with Dr. Tomie China at next office visit.  Order will be removed from the [facility] and if patient decides in the future to have we can reinstate the order or create a new one. Thank you so much!

## 2020-05-16 NOTE — Telephone Encounter (Signed)
FYI

## 2020-05-20 ENCOUNTER — Encounter: Payer: Self-pay | Admitting: Obstetrics and Gynecology

## 2020-05-20 NOTE — Telephone Encounter (Signed)
See 05/16/20 telephone encounter.   Encounter closed.

## 2020-05-20 NOTE — Telephone Encounter (Signed)
Routing to Dr.Jertson for review. 

## 2020-05-20 NOTE — Telephone Encounter (Signed)
Chelsea Gross  [facility] Clinical Pool Hi,  I am now taking a beta blocker which has decreased my heart rate. I'm not going to get the stress test that he recommends. One reason is money is tight and I can't pay the co-pay. Another reason is that I don't want to be injected with whatever substance they use for the stress test. I was told I needed to get the hysteroscopy sooner rather than later. I've gotten everything set up to do that and I'm eager to get it over since its caused quite a bit of anxiety. I have another appt with the cardiologist in a couple of weeks to check my heart rate.    Routing to [employer], Charity fundraiser

## 2020-05-23 NOTE — Telephone Encounter (Signed)
Please let the patient know that I understand her concerns, but need Cardiology to clear her for surgery. Anesthesia won't put her to sleep without it. The last thing in the world we want is to hurt her. Please check with the Cardiology office if they will clear her without the testing. Thanks.

## 2020-05-24 NOTE — Telephone Encounter (Signed)
Spoke with patient. Advised of message as seen below from Dr.Jertson. Patient verbalizes understanding. Patient is to see cardiology for follow up on 05/29/2020. Patient will discuss testing with Dr.Revenkar at that time. Aware will need to be cleared before we are able to proceed with rescheduling.  Routing to provider and will close encounter.

## 2020-05-29 ENCOUNTER — Other Ambulatory Visit: Payer: Self-pay

## 2020-05-29 ENCOUNTER — Ambulatory Visit (INDEPENDENT_AMBULATORY_CARE_PROVIDER_SITE_OTHER): Payer: 59 | Admitting: Cardiology

## 2020-05-29 ENCOUNTER — Encounter: Payer: Self-pay | Admitting: Cardiology

## 2020-05-29 VITALS — BP 114/74 | HR 88 | Ht 64.0 in | Wt 165.0 lb

## 2020-05-29 DIAGNOSIS — R000 Tachycardia, unspecified: Secondary | ICD-10-CM

## 2020-05-29 DIAGNOSIS — Z01810 Encounter for preprocedural cardiovascular examination: Secondary | ICD-10-CM

## 2020-05-29 DIAGNOSIS — E782 Mixed hyperlipidemia: Secondary | ICD-10-CM | POA: Diagnosis not present

## 2020-05-29 DIAGNOSIS — R002 Palpitations: Secondary | ICD-10-CM | POA: Diagnosis not present

## 2020-05-29 NOTE — Patient Instructions (Signed)

## 2020-05-29 NOTE — Progress Notes (Signed)
Cardiology Office Note:    Date:  05/29/2020   ID:  Chelsea Gross, DOB 1966/01/18, MRN [id number]  PCP:  Raliegh Ip, DO  Cardiologist:  Garwin Brothers, MD   Referring MD: Raliegh Ip, DO    ASSESSMENT:    1. Inappropriate sinus tachycardia   2. Preoperative cardiovascular examination   3. Palpitations   4. Mixed hyperlipidemia    PLAN:    In order of problems listed above:  1. Primary prevention stressed with the patient.  Importance of compliance with diet medication stressed she vocalized understanding.  Diet was explained and weight reduction was stressed and she was advised to walk at least half an hour a day regularly.  She agrees. 2. Inappropriate sinus tachycardia: This is stable at this time.  Patient is taking beta-blocker regularly and tolerating it well.  She tells me that her baseline generally heart rate is about 80.  She is happy about it. 3. Mixed dyslipidemia: Diet was emphasized and lipids were reviewed.  She will get them checked by her physician in the next few days. 4. Preoperative risk stratification: Echocardiogram is unremarkable.  I noted CT scan done about a year ago did not have any calcification in the coronaries or any arterial system in the thorax.  In view of this she is not at high risk for coronary events during the aforementioned surgery.  Meticulous hemodynamic monitoring and continued perioperative beta-blockade will further reduce the risk of coronary events.  She is not keen on stress testing and I respect her wishes. 5. Patient will be seen in follow-up appointment in 6 months or earlier if the patient has any concerns    Medication Adjustments/Labs and Tests Ordered: Current medicines are reviewed at length with the patient today.  Concerns regarding medicines are outlined above.  No orders of the defined types were placed in this encounter.  No orders of the defined types were placed in this encounter.    No chief  complaint on file.    History of Present Illness:    Chelsea Gross is a 54 y.o. female.  Patient was evaluated by me for palpitations and for preoperative stratification.  She has no history of coronary artery disease.  She has history of palpitations and sinus tachycardia.  She is on a beta-blocker now and feels better.  She tells me that she is active all day long without any symptoms.  She is planning to undergo hysteroscopy.  At the time of my evaluation, the patient is alert awake oriented and in no distress.  Past Medical History:  Diagnosis Date  . Anxiety   . Chest discomfort 10/13/2019  . Controlled substance agreement signed 09/21/2018  . Depression 04/16/2016  . Generalized anxiety disorder 09/21/2018  . GERD (gastroesophageal reflux disease)   . History of palpitations    MARCH 2021  . Hyperlipemia   . Hyperlipidemia 04/16/2016  . Hypothyroid   . Hypothyroidism 04/16/2016  . 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  . Palpitations 10/13/2019  . PMB (postmenopausal bleeding)   . Preoperative cardiovascular examination 05/10/2020  . Thyroid disease   . Urinary incontinence     Past Surgical History:  Procedure Laterality Date  . chin and lip surgery  2001  . COLONSCOPY  2018   5 POLYPS REMOVED  . labial repair  2000    Current Medications: Current Meds  Medication Sig  . atorvastatin (LIPITOR) 40 MG tablet Take 1 tablet (40 mg total) by mouth daily at  6 PM.  . DULoxetine (CYMBALTA) 60 MG capsule Take 1 capsule (60 mg total) by mouth daily.  Marland Kitchen levothyroxine (SYNTHROID) 25 MCG tablet TAKE ONE TABLET BY MOUTH DAILY BEFORE BREAKFAST  . metoprolol succinate (TOPROL XL) 25 MG 24 hr tablet Take 1 tablet (25 mg total) by mouth daily.  . metroNIDAZOLE (METROGEL) 0.75 % gel Apply 1 application topically 2 (two) times daily. For rosacea  . omeprazole (PRILOSEC) 20 MG capsule Take 1 capsule (20 mg total) by mouth daily.  . valACYclovir (VALTREX) 500 MG tablet Take 1 tablet  (500 mg total) by mouth daily.     Allergies:   Sulfa antibiotics, Wellbutrin [bupropion], and Pantoprazole   Social History   Socioeconomic History  . Marital status: Married    Spouse name: Not on file  . Number of children: Not on file  . Years of education: Not on file  . Highest education level: Not on file  Occupational History  . Occupation: CMA  Tobacco Use  . Smoking status: Never Smoker  . Smokeless tobacco: Never Used  Vaping Use  . Vaping Use: Never used  Substance and Sexual Activity  . Alcohol use: Yes    Alcohol/week: 0.0 standard drinks    Comment: RARE  . Drug use: Yes    Comment: cannabis pills or gummies rarely LAST USED  2020 PER PT  . Sexual activity: Yes    Partners: Male    Birth control/protection: None  Other Topics Concern  . Not on file  Social History Narrative  . Not on file   Social Determinants of Health   Financial Resource Strain:   . Difficulty of Paying Living Expenses: Not on file  Food Insecurity:   . Worried About Programme researcher, broadcasting/film/video in the Last Year: Not on file  . Ran Out of Food in the Last Year: Not on file  Transportation Needs:   . Lack of Transportation (Medical): Not on file  . Lack of Transportation (Non-Medical): Not on file  Physical Activity:   . Days of Exercise per Week: Not on file  . Minutes of Exercise per Session: Not on file  Stress:   . Feeling of Stress : Not on file  Social Connections:   . Frequency of Communication with Friends and Family: Not on file  . Frequency of Social Gatherings with Friends and Family: Not on file  . Attends Religious Services: Not on file  . Active Member of Clubs or Organizations: Not on file  . Attends Banker Meetings: Not on file  . Marital Status: Not on file     Family History: The patient's family history includes Esophageal cancer in her mother; Heart attack in her father and mother; Hyperlipidemia in her sister, sister, sister, and sister; Thyroid  disease in her sister.  ROS:   Please see the history of present illness.    All other systems reviewed and are negative.  EKGs/Labs/Other Studies Reviewed:    The following studies were reviewed today: I discussed my findings with the patient extensive length including echocardiogram and monitoring report.   Recent Labs: 11/27/2019: ALT 20; BUN 12; Creatinine, Ser 0.76; Potassium 4.7; Sodium 141; TSH 2.760  Recent Lipid Panel    Component Value Date/Time   CHOL 179 11/27/2019 1232   TRIG 165 (H) 11/27/2019 1232   HDL 50 11/27/2019 1232   CHOLHDL 3.6 11/27/2019 1232   LDLCALC 100 (H) 11/27/2019 1232    Physical Exam:    VS:  BP 114/74   Pulse 88   Ht 5\' 4"  (1.626 m)   Wt 165 lb (74.8 kg)   LMP 04/16/2020   SpO2 98%   BMI 28.32 kg/m     Wt Readings from Last 3 Encounters:  05/29/20 165 lb (74.8 kg)  05/10/20 165 lb (74.8 kg)     GEN: Patient is in no acute distress HEENT: Normal NECK: No JVD; No carotid bruits LYMPHATICS: No lymphadenopathy CARDIAC: Hear sounds regular, 2/6 systolic murmur at the apex. RESPIRATORY:  Clear to auscultation without rales, wheezing or rhonchi  ABDOMEN: Soft, non-tender, non-distended MUSCULOSKELETAL:  No edema; No deformity  SKIN: Warm and dry NEUROLOGIC:  Alert and oriented x 3 PSYCHIATRIC:  Normal affect   Signed, 05/12/20, MD  05/29/2020 3:55 PM    [facility]

## 2020-06-04 ENCOUNTER — Encounter: Payer: Self-pay | Admitting: Obstetrics and Gynecology

## 2020-06-05 ENCOUNTER — Telehealth: Payer: Self-pay | Admitting: *Deleted

## 2020-06-05 ENCOUNTER — Encounter: Payer: Self-pay | Admitting: Obstetrics and Gynecology

## 2020-06-05 NOTE — Telephone Encounter (Signed)
Chelsea Gross [facility] Clinical Pool My cardiologist has cleared me for the procedure that Im supposed to have. Has this been scheduled?    Routing to Billie Ruddy, RN for surgical planning.

## 2020-06-05 NOTE — Telephone Encounter (Signed)
See telephone encounter dated 06/05/20.   Encounter closed.  

## 2020-06-05 NOTE — Telephone Encounter (Signed)
Call to patient. Patient desires to proceed with surgery as soon as available. Offered 06-18-20 and patient is agreeable.  Aware of Covid testing and quarrantine requirements and is agreeable. Will schedule and call her back.

## 2020-06-06 ENCOUNTER — Other Ambulatory Visit: Payer: Self-pay

## 2020-06-06 ENCOUNTER — Ambulatory Visit (INDEPENDENT_AMBULATORY_CARE_PROVIDER_SITE_OTHER): Payer: 59 | Admitting: Obstetrics and Gynecology

## 2020-06-06 ENCOUNTER — Encounter: Payer: Self-pay | Admitting: Obstetrics and Gynecology

## 2020-06-06 VITALS — BP 132/68 | HR 78 | Ht 64.0 in

## 2020-06-06 DIAGNOSIS — N950 Postmenopausal bleeding: Secondary | ICD-10-CM

## 2020-06-06 DIAGNOSIS — R9389 Abnormal findings on diagnostic imaging of other specified body structures: Secondary | ICD-10-CM

## 2020-06-06 MED ORDER — MISOPROSTOL 200 MCG PO TABS: ORAL_TABLET | ORAL | 0 refills | Status: DC

## 2020-06-06 NOTE — Telephone Encounter (Signed)
Chelsea Cogan, MD 2 hours ago (8:04 AM)   I think I will keep the November 23 appointment. My office can be short staffed for one day.   Chelsea Cogan, MD 21 hours ago (1:31 PM)   Hi. I just spoke with you a few minutes ago. Unfortunately November 23 is not going to work for my procedure because one of my co workers has that day off. We are a small office. Do you have anything the following week? Normally no one in my office has a Tuesday off which is why I thought it would be fine to schedule it then.

## 2020-06-06 NOTE — Telephone Encounter (Signed)
Spoke with patient.  Confirmed surgery scheduled for 06/18/20 at 9:30am, arriving at 7:30am at [facility].  Pre-op appt scheduled for 11/11 at 4pm.  Post-op appt scheduled for 07/03/20 at 4:30pm.  Covid 19 prescreen testing scheduled for 11/19 at 2:30pm.  Surgery instruction sheet reviewed and printed copy to be provided to patient with hospital brochure during pre-op appt today.  Patient advised of Covid screening and quarantine requirements and agreeable.   Routing to provider for final review. Patient is agreeable to disposition. Will close encounter.

## 2020-06-06 NOTE — H&P (View-Only) (Signed)
GYNECOLOGY  VISIT   HPI: 53 y.o.   Married White or Caucasian Not Hispanic or Latino  female   G1P1001 with Patient's last menstrual period was 04/16/2020.   here for pre op D&C   The patient was seen at the end of July with postmenopausal bleeding. Ultrasound was suspicious for an endometrial polyp. Surgery was put on hold for Cardiac clearance which she has now been given.   GYNECOLOGIC HISTORY: Patient's last menstrual period was 04/16/2020. Contraception:PMP Menopausal hormone therapy: none         OB History    Gravida  1   Para  1   Term  1   Preterm      AB      Living  1     SAB      TAB      Ectopic      Multiple      Live Births  1              Patient Active Problem List   Diagnosis Date Noted  . 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  . Preoperative cardiovascular examination 05/10/2020  . Anxiety   . GERD (gastroesophageal reflux disease)   . History of palpitations   . Hyperlipemia   . Hypothyroid   . PMB (postmenopausal bleeding)   . Thyroid disease   . Urinary incontinence   . Palpitations 10/13/2019  . Chest discomfort 10/13/2019  . Generalized anxiety disorder 09/21/2018  . Controlled substance agreement signed 09/21/2018  . Depression 04/16/2016  . Hyperlipidemia 04/16/2016  . Hypothyroidism 04/16/2016    Past Medical History:  Diagnosis Date  . Anxiety   . Chest discomfort 10/13/2019  . Controlled substance agreement signed 09/21/2018  . Depression 04/16/2016  . Generalized anxiety disorder 09/21/2018  . GERD (gastroesophageal reflux disease)   . History of palpitations    MARCH 2021  . Hyperlipemia   . Hyperlipidemia 04/16/2016  . Hypothyroid   . Hypothyroidism 04/16/2016  . 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  . Palpitations 10/13/2019  . PMB (postmenopausal bleeding)   . Preoperative cardiovascular examination 05/10/2020  . Thyroid disease   . Urinary incontinence     Past Surgical History:  Procedure  Laterality Date  . chin and lip surgery  2001  . COLONSCOPY  2018   5 POLYPS REMOVED  . labial repair  2000    Current Outpatient Medications  Medication Sig Dispense Refill  . atorvastatin (LIPITOR) 40 MG tablet Take 1 tablet (40 mg total) by mouth daily at 6 PM. 30 tablet 12  . DULoxetine (CYMBALTA) 60 MG capsule Take 1 capsule (60 mg total) by mouth daily. 90 capsule 3  . levothyroxine (SYNTHROID) 25 MCG tablet TAKE ONE TABLET BY MOUTH DAILY BEFORE BREAKFAST 90 tablet 1  . metoprolol succinate (TOPROL XL) 25 MG 24 hr tablet Take 1 tablet (25 mg total) by mouth daily. 30 tablet 6  . metroNIDAZOLE (METROGEL) 0.75 % gel Apply 1 application topically 2 (two) times daily. For rosacea 45 g 12  . valACYclovir (VALTREX) 500 MG tablet Take 1 tablet (500 mg total) by mouth daily. 30 tablet 12  . omeprazole (PRILOSEC) 20 MG capsule Take 1 capsule (20 mg total) by mouth daily. 90 capsule 3   No current facility-administered medications for this visit.     ALLERGIES: Sulfa antibiotics, Wellbutrin [bupropion], and Pantoprazole  Family History  Problem Relation Age of Onset  . Heart attack Mother   . Esophageal cancer Mother   .   Heart attack Father   . Hyperlipidemia Sister   . Thyroid disease Sister   . Hyperlipidemia Sister   . Hyperlipidemia Sister   . Hyperlipidemia Sister     Social History   Socioeconomic History  . Marital status: Married    Spouse name: Not on file  . Number of children: Not on file  . Years of education: Not on file  . Highest education level: Not on file  Occupational History  . Occupation: CMA  Tobacco Use  . Smoking status: Never Smoker  . Smokeless tobacco: Never Used  Vaping Use  . Vaping Use: Never used  Substance and Sexual Activity  . Alcohol use: Yes    Alcohol/week: 0.0 standard drinks    Comment: RARE  . Drug use: Yes    Comment: cannabis pills or gummies rarely LAST USED  2020 PER PT  . Sexual activity: Yes    Partners: Male    Birth  control/protection: None  Other Topics Concern  . Not on file  Social History Narrative  . Not on file   Social Determinants of Health   Financial Resource Strain:   . Difficulty of Paying Living Expenses: Not on file  Food Insecurity:   . Worried About Programme researcher, broadcasting/film/video in the Last Year: Not on file  . Ran Out of Food in the Last Year: Not on file  Transportation Needs:   . Lack of Transportation (Medical): Not on file  . Lack of Transportation (Non-Medical): Not on file  Physical Activity:   . Days of Exercise per Week: Not on file  . Minutes of Exercise per Session: Not on file  Stress:   . Feeling of Stress : Not on file  Social Connections:   . Frequency of Communication with Friends and Family: Not on file  . Frequency of Social Gatherings with Friends and Family: Not on file  . Attends Religious Services: Not on file  . Active Member of Clubs or Organizations: Not on file  . Attends Banker Meetings: Not on file  . Marital Status: Not on file  Intimate Partner Violence:   . Fear of Current or Ex-Partner: Not on file  . Emotionally Abused: Not on file  . Physically Abused: Not on file  . Sexually Abused: Not on file    Review of Systems  All other systems reviewed and are negative.   PHYSICAL EXAMINATION:    BP 132/68   Pulse 78   Ht 5\' 4"  (1.626 m)   LMP 04/16/2020   SpO2 99%   BMI 28.32 kg/m     General appearance: alert, cooperative and appears stated age Neck: no adenopathy, supple, symmetrical, trachea midline and thyroid normal to inspection and palpation Heart: regular rate and rhythm Lungs: CTAB Abdomen: soft, non-tender; bowel sounds normal; no masses,  no organomegaly Extremities: normal, atraumatic, no cyanosis Skin: normal color, texture and turgor, no rashes or lesions Lymph: normal cervical supraclavicular and inguinal nodes Neurologic: grossly normal   ASSESSMENT Postmenopausal bleeding, suspected polyp on ultrasound H/O  cervical stenosis    PLAN Plan: hysteroscopy, possible polypectomy, dilation and curettage. Reviewed risks, including: bleeding, infection, uterine perforation, fluid overload, need for further sugery Will pre-treat with cytotec

## 2020-06-06 NOTE — Telephone Encounter (Signed)
See telephone encounter dated 06/05/20.   Encounter closed.

## 2020-06-06 NOTE — Progress Notes (Signed)
GYNECOLOGY  VISIT   HPI: 54 y.o.   Married White or Caucasian Not Hispanic or Latino  female   G1P1001 with Patient's last menstrual period was 04/16/2020.   here for pre op D&C   The patient was seen at the end of July with postmenopausal bleeding. Ultrasound was suspicious for an endometrial polyp. Surgery was put on hold for Cardiac clearance which she has now been given.   GYNECOLOGIC HISTORY: Patient's last menstrual period was 04/16/2020. Contraception:PMP Menopausal hormone therapy: none         OB History    Gravida  1   Para  1   Term  1   Preterm      AB      Living  1     SAB      TAB      Ectopic      Multiple      Live Births  1              Patient Active Problem List   Diagnosis Date Noted  . 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  . Preoperative cardiovascular examination 05/10/2020  . Anxiety   . GERD (gastroesophageal reflux disease)   . History of palpitations   . Hyperlipemia   . Hypothyroid   . PMB (postmenopausal bleeding)   . Thyroid disease   . Urinary incontinence   . Palpitations 10/13/2019  . Chest discomfort 10/13/2019  . Generalized anxiety disorder 09/21/2018  . Controlled substance agreement signed 09/21/2018  . Depression 04/16/2016  . Hyperlipidemia 04/16/2016  . Hypothyroidism 04/16/2016    Past Medical History:  Diagnosis Date  . Anxiety   . Chest discomfort 10/13/2019  . Controlled substance agreement signed 09/21/2018  . Depression 04/16/2016  . Generalized anxiety disorder 09/21/2018  . GERD (gastroesophageal reflux disease)   . History of palpitations    MARCH 2021  . Hyperlipemia   . Hyperlipidemia 04/16/2016  . Hypothyroid   . Hypothyroidism 04/16/2016  . 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  . Palpitations 10/13/2019  . PMB (postmenopausal bleeding)   . Preoperative cardiovascular examination 05/10/2020  . Thyroid disease   . Urinary incontinence     Past Surgical History:  Procedure  Laterality Date  . chin and lip surgery  2001  . COLONSCOPY  2018   5 POLYPS REMOVED  . labial repair  2000    Current Outpatient Medications  Medication Sig Dispense Refill  . atorvastatin (LIPITOR) 40 MG tablet Take 1 tablet (40 mg total) by mouth daily at 6 PM. 30 tablet 12  . DULoxetine (CYMBALTA) 60 MG capsule Take 1 capsule (60 mg total) by mouth daily. 90 capsule 3  . levothyroxine (SYNTHROID) 25 MCG tablet TAKE ONE TABLET BY MOUTH DAILY BEFORE BREAKFAST 90 tablet 1  . metoprolol succinate (TOPROL XL) 25 MG 24 hr tablet Take 1 tablet (25 mg total) by mouth daily. 30 tablet 6  . metroNIDAZOLE (METROGEL) 0.75 % gel Apply 1 application topically 2 (two) times daily. For rosacea 45 g 12  . valACYclovir (VALTREX) 500 MG tablet Take 1 tablet (500 mg total) by mouth daily. 30 tablet 12  . omeprazole (PRILOSEC) 20 MG capsule Take 1 capsule (20 mg total) by mouth daily. 90 capsule 3   No current facility-administered medications for this visit.     ALLERGIES: Sulfa antibiotics, Wellbutrin [bupropion], and Pantoprazole  Family History  Problem Relation Age of Onset  . Heart attack Mother   . Esophageal cancer Mother   .  Heart attack Father   . Hyperlipidemia Sister   . Thyroid disease Sister   . Hyperlipidemia Sister   . Hyperlipidemia Sister   . Hyperlipidemia Sister     Social History   Socioeconomic History  . Marital status: Married    Spouse name: Not on file  . Number of children: Not on file  . Years of education: Not on file  . Highest education level: Not on file  Occupational History  . Occupation: CMA  Tobacco Use  . Smoking status: Never Smoker  . Smokeless tobacco: Never Used  Vaping Use  . Vaping Use: Never used  Substance and Sexual Activity  . Alcohol use: Yes    Alcohol/week: 0.0 standard drinks    Comment: RARE  . Drug use: Yes    Comment: cannabis pills or gummies rarely LAST USED  2020 PER PT  . Sexual activity: Yes    Partners: Male    Birth  control/protection: None  Other Topics Concern  . Not on file  Social History Narrative  . Not on file   Social Determinants of Health   Financial Resource Strain:   . Difficulty of Paying Living Expenses: Not on file  Food Insecurity:   . Worried About Programme researcher, broadcasting/film/video in the Last Year: Not on file  . Ran Out of Food in the Last Year: Not on file  Transportation Needs:   . Lack of Transportation (Medical): Not on file  . Lack of Transportation (Non-Medical): Not on file  Physical Activity:   . Days of Exercise per Week: Not on file  . Minutes of Exercise per Session: Not on file  Stress:   . Feeling of Stress : Not on file  Social Connections:   . Frequency of Communication with Friends and Family: Not on file  . Frequency of Social Gatherings with Friends and Family: Not on file  . Attends Religious Services: Not on file  . Active Member of Clubs or Organizations: Not on file  . Attends Banker Meetings: Not on file  . Marital Status: Not on file  Intimate Partner Violence:   . Fear of Current or Ex-Partner: Not on file  . Emotionally Abused: Not on file  . Physically Abused: Not on file  . Sexually Abused: Not on file    Review of Systems  All other systems reviewed and are negative.   PHYSICAL EXAMINATION:    BP 132/68   Pulse 78   Ht 5\' 4"  (1.626 m)   LMP 04/16/2020   SpO2 99%   BMI 28.32 kg/m     General appearance: alert, cooperative and appears stated age Neck: no adenopathy, supple, symmetrical, trachea midline and thyroid normal to inspection and palpation Heart: regular rate and rhythm Lungs: CTAB Abdomen: soft, non-tender; bowel sounds normal; no masses,  no organomegaly Extremities: normal, atraumatic, no cyanosis Skin: normal color, texture and turgor, no rashes or lesions Lymph: normal cervical supraclavicular and inguinal nodes Neurologic: grossly normal   ASSESSMENT Postmenopausal bleeding, suspected polyp on ultrasound H/O  cervical stenosis    PLAN Plan: hysteroscopy, possible polypectomy, dilation and curettage. Reviewed risks, including: bleeding, infection, uterine perforation, fluid overload, need for further sugery Will pre-treat with cytotec

## 2020-06-11 ENCOUNTER — Encounter (HOSPITAL_BASED_OUTPATIENT_CLINIC_OR_DEPARTMENT_OTHER): Payer: Self-pay | Admitting: Obstetrics and Gynecology

## 2020-06-14 ENCOUNTER — Other Ambulatory Visit (HOSPITAL_COMMUNITY)
Admission: RE | Admit: 2020-06-14 | Discharge: 2020-06-14 | Disposition: A | Discharge status: Home / Self Care | Payer: 59 | Source: Ambulatory Visit | Attending: Obstetrics and Gynecology | Admitting: Obstetrics and Gynecology

## 2020-06-14 DIAGNOSIS — Z01812 Encounter for preprocedural laboratory examination: Secondary | ICD-10-CM | POA: Diagnosis present

## 2020-06-14 DIAGNOSIS — Z20822 Contact with and (suspected) exposure to covid-19: Secondary | ICD-10-CM | POA: Insufficient documentation

## 2020-06-14 LAB — SARS CORONAVIRUS 2 (TAT 6-24 HRS): SARS Coronavirus 2: NEGATIVE

## 2020-06-17 ENCOUNTER — Encounter (HOSPITAL_COMMUNITY): Payer: Self-pay | Admitting: *Deleted

## 2020-06-17 NOTE — Progress Notes (Addendum)
Called pt on 06-11-2020 and 06-12-2020 and 06-13-2020 left message .  Left message on patient voice mail  11-22-2021cnpo after midnight  Tonight,  arrive 740 am [facility] take the following medications with sip of water in am: alprazolam prn, duloxetine, atorvastatin, levothyroxine, omeprazole. Bring list of current medications to be updated in am. Call [phone] if problems in am.  Cardiac clearance dr Tomie China 05-29-2020 epic Chest ct 07-10-2019 epic Monitor report 10-13-2019 epic ekg 05-10-2020 epic  Needs urine poct day of surgery

## 2020-06-18 ENCOUNTER — Encounter (HOSPITAL_BASED_OUTPATIENT_CLINIC_OR_DEPARTMENT_OTHER)
Admission: RE | Disposition: A | Discharge status: Home / Self Care | Payer: Self-pay | Source: Home / Self Care | Attending: Obstetrics and Gynecology

## 2020-06-18 ENCOUNTER — Ambulatory Visit (HOSPITAL_BASED_OUTPATIENT_CLINIC_OR_DEPARTMENT_OTHER)
Admission: RE | Admit: 2020-06-18 | Discharge: 2020-06-18 | Disposition: A | Discharge status: Home / Self Care | Payer: 59 | Attending: Obstetrics and Gynecology | Admitting: Obstetrics and Gynecology

## 2020-06-18 ENCOUNTER — Ambulatory Visit (HOSPITAL_BASED_OUTPATIENT_CLINIC_OR_DEPARTMENT_OTHER): Payer: 59 | Admitting: Anesthesiology

## 2020-06-18 ENCOUNTER — Encounter (HOSPITAL_BASED_OUTPATIENT_CLINIC_OR_DEPARTMENT_OTHER): Payer: Self-pay | Admitting: Obstetrics and Gynecology

## 2020-06-18 DIAGNOSIS — N950 Postmenopausal bleeding: Secondary | ICD-10-CM | POA: Diagnosis not present

## 2020-06-18 DIAGNOSIS — Z888 Allergy status to other drugs, medicaments and biological substances status: Secondary | ICD-10-CM | POA: Diagnosis not present

## 2020-06-18 DIAGNOSIS — N840 Polyp of corpus uteri: Secondary | ICD-10-CM | POA: Diagnosis not present

## 2020-06-18 DIAGNOSIS — Z79899 Other long term (current) drug therapy: Secondary | ICD-10-CM | POA: Diagnosis not present

## 2020-06-18 DIAGNOSIS — N882 Stricture and stenosis of cervix uteri: Secondary | ICD-10-CM

## 2020-06-18 DIAGNOSIS — Z419 Encounter for procedure for purposes other than remedying health state, unspecified: Secondary | ICD-10-CM

## 2020-06-18 DIAGNOSIS — Z882 Allergy status to sulfonamides status: Secondary | ICD-10-CM | POA: Diagnosis not present

## 2020-06-18 DIAGNOSIS — Z79890 Hormone replacement therapy (postmenopausal): Secondary | ICD-10-CM | POA: Diagnosis not present

## 2020-06-18 HISTORY — PX: DILATATION & CURETTAGE/HYSTEROSCOPY WITH MYOSURE: SHX6511

## 2020-06-18 LAB — POCT PREGNANCY, URINE: Preg Test, Ur: NEGATIVE

## 2020-06-18 SURGERY — DILATATION & CURETTAGE/HYSTEROSCOPY WITH MYOSURE
Anesthesia: General

## 2020-06-18 MED ORDER — MIDAZOLAM HCL 5 MG/5ML IJ SOLN
INTRAMUSCULAR | Status: DC | PRN
  Administered 2020-06-18: 2 mg via INTRAVENOUS

## 2020-06-18 MED ORDER — LIDOCAINE 2% (20 MG/ML) 5 ML SYRINGE
INTRAMUSCULAR | Status: DC | PRN
  Administered 2020-06-18: 60 mg via INTRAVENOUS

## 2020-06-18 MED ORDER — FENTANYL CITRATE (PF) 100 MCG/2ML IJ SOLN
INTRAMUSCULAR | Status: DC | PRN
  Administered 2020-06-18 (×2): 50 ug via INTRAVENOUS

## 2020-06-18 MED ORDER — MIDAZOLAM HCL 2 MG/2ML IJ SOLN
INTRAMUSCULAR | Status: AC
  Filled 2020-06-18: qty 2

## 2020-06-18 MED ORDER — LACTATED RINGERS IV SOLN: INTRAVENOUS | Status: DC

## 2020-06-18 MED ORDER — ACETAMINOPHEN 500 MG PO TABS: 1000.0000 mg | ORAL_TABLET | ORAL | Status: DC

## 2020-06-18 MED ORDER — FENTANYL CITRATE (PF) 100 MCG/2ML IJ SOLN
INTRAMUSCULAR | Status: AC
  Filled 2020-06-18: qty 2

## 2020-06-18 MED ORDER — ACETAMINOPHEN 500 MG PO TABS
ORAL_TABLET | ORAL | Status: AC
  Filled 2020-06-18: qty 2

## 2020-06-18 MED ORDER — ACETAMINOPHEN 500 MG PO TABS
1000.0000 mg | ORAL_TABLET | Freq: Once | ORAL | Status: AC
  Administered 2020-06-18: 1000 mg via ORAL

## 2020-06-18 MED ORDER — OXYCODONE HCL 5 MG PO TABS: 5.0000 mg | ORAL_TABLET | Freq: Once | ORAL | Status: DC | PRN

## 2020-06-18 MED ORDER — EPHEDRINE 5 MG/ML INJ
INTRAVENOUS | Status: AC
  Filled 2020-06-18: qty 10

## 2020-06-18 MED ORDER — LIDOCAINE HCL (PF) 2 % IJ SOLN
INTRAMUSCULAR | Status: AC
  Filled 2020-06-18: qty 5

## 2020-06-18 MED ORDER — KETOROLAC TROMETHAMINE 30 MG/ML IJ SOLN
INTRAMUSCULAR | Status: DC | PRN
  Administered 2020-06-18 (×2): 30 mg via INTRAVENOUS

## 2020-06-18 MED ORDER — POVIDONE-IODINE 10 % EX SWAB: 2.0000 "application " | Freq: Once | CUTANEOUS | Status: DC

## 2020-06-18 MED ORDER — DEXAMETHASONE SODIUM PHOSPHATE 10 MG/ML IJ SOLN
INTRAMUSCULAR | Status: AC
  Filled 2020-06-18: qty 1

## 2020-06-18 MED ORDER — PROPOFOL 10 MG/ML IV BOLUS
INTRAVENOUS | Status: AC
  Filled 2020-06-18: qty 20

## 2020-06-18 MED ORDER — EPHEDRINE SULFATE-NACL 50-0.9 MG/10ML-% IV SOSY
PREFILLED_SYRINGE | INTRAVENOUS | Status: DC | PRN
  Administered 2020-06-18 (×5): 10 mg via INTRAVENOUS

## 2020-06-18 MED ORDER — ONDANSETRON HCL 4 MG/2ML IJ SOLN
INTRAMUSCULAR | Status: AC
  Filled 2020-06-18: qty 2

## 2020-06-18 MED ORDER — OXYCODONE HCL 5 MG/5ML PO SOLN: 5.0000 mg | Freq: Once | ORAL | Status: DC | PRN

## 2020-06-18 MED ORDER — PROPOFOL 10 MG/ML IV BOLUS
INTRAVENOUS | Status: DC | PRN
  Administered 2020-06-18: 200 mg via INTRAVENOUS

## 2020-06-18 MED ORDER — KETOROLAC TROMETHAMINE 30 MG/ML IJ SOLN: 30.0000 mg | Freq: Once | INTRAMUSCULAR | Status: DC | PRN

## 2020-06-18 MED ORDER — DEXAMETHASONE SODIUM PHOSPHATE 10 MG/ML IJ SOLN
INTRAMUSCULAR | Status: DC | PRN
  Administered 2020-06-18: 10 mg via INTRAVENOUS

## 2020-06-18 MED ORDER — PROMETHAZINE HCL 25 MG/ML IJ SOLN: 6.2500 mg | INTRAMUSCULAR | Status: DC | PRN

## 2020-06-18 MED ORDER — FENTANYL CITRATE (PF) 100 MCG/2ML IJ SOLN
25.0000 ug | INTRAMUSCULAR | Status: DC | PRN
  Administered 2020-06-18 (×2): 25 ug via INTRAVENOUS

## 2020-06-18 MED ORDER — VASOPRESSIN 20 UNIT/ML IV SOLN
INTRAVENOUS | Status: DC | PRN
  Administered 2020-06-18: 7 mL via INTRAMUSCULAR

## 2020-06-18 MED ORDER — KETOROLAC TROMETHAMINE 30 MG/ML IJ SOLN
INTRAMUSCULAR | Status: AC
  Filled 2020-06-18: qty 1

## 2020-06-18 MED ORDER — ONDANSETRON HCL 4 MG/2ML IJ SOLN
INTRAMUSCULAR | Status: DC | PRN
  Administered 2020-06-18: 4 mg via INTRAVENOUS

## 2020-06-18 SURGICAL SUPPLY — 11 items
DEVICE MYOSURE REACH (MISCELLANEOUS) ×1 IMPLANT
GLOVE BIO SURGEON STRL SZ 6.5 (GLOVE) ×2 IMPLANT
GOWN STRL REUS W/TWL LRG LVL3 (GOWN DISPOSABLE) ×2 IMPLANT
IV NS IRRIG 3000ML ARTHROMATIC (IV SOLUTION) ×2 IMPLANT
KIT PROCEDURE FLUENT (KITS) ×2 IMPLANT
KIT TURNOVER CYSTO (KITS) ×2 IMPLANT
PACK VAGINAL MINOR WOMEN LF (CUSTOM PROCEDURE TRAY) ×2 IMPLANT
PAD OB MATERNITY 4.3X12.25 (PERSONAL CARE ITEMS) ×2 IMPLANT
PAD PREP 24X48 CUFFED NSTRL (MISCELLANEOUS) ×2 IMPLANT
SEAL ROD LENS SCOPE MYOSURE (ABLATOR) ×2 IMPLANT
TOWEL OR 17X26 10 PK STRL BLUE (TOWEL DISPOSABLE) ×2 IMPLANT

## 2020-06-18 NOTE — Anesthesia Postprocedure Evaluation (Signed)
Anesthesia Post Note  Patient: Chelsea Gross  Procedure(s) Performed: DILATATION & CURETTAGE/HYSTEROSCOPY WITH MYOSURE (N/A )     Patient location during evaluation: PACU Anesthesia Type: General Level of consciousness: awake and alert Pain management: pain level controlled Vital Signs Assessment: post-procedure vital signs reviewed and stable Respiratory status: spontaneous breathing, nonlabored ventilation, respiratory function stable and patient connected to nasal cannula oxygen Cardiovascular status: blood pressure returned to baseline and stable Postop Assessment: no apparent nausea or vomiting Anesthetic complications: no   No complications documented.  Last Vitals:  Vitals:   06/18/20 1101 06/18/20 1135  BP:  115/76  Pulse: 82 86  Resp: (!) 9 18  Temp:  36.9 C  SpO2: 94% 99%    Last Pain:  Vitals:   06/18/20 1135  TempSrc:   PainSc: 2                  Tip Atienza P Madailein Londo

## 2020-06-18 NOTE — Anesthesia Preprocedure Evaluation (Addendum)
Anesthesia Evaluation  Patient identified by MRN, date of birth, ID band Patient awake    Reviewed: Allergy & Precautions, NPO status , Patient's Chart, lab work & pertinent test results  Airway Mallampati: IV  TM Distance: >3 FB Neck ROM: Full    Dental no notable dental hx.    Pulmonary neg pulmonary ROS,    Pulmonary exam normal breath sounds clear to auscultation       Cardiovascular negative cardio ROS Normal cardiovascular exam Rhythm:Regular Rate:Normal  Inappropriate sinus tachycardia   Neuro/Psych PSYCHIATRIC DISORDERS Anxiety Depression negative neurological ROS     GI/Hepatic negative GI ROS, Neg liver ROS,   Endo/Other  Hypothyroidism   Renal/GU negative Renal ROS     Musculoskeletal negative musculoskeletal ROS (+)   Abdominal   Peds  Hematology HLD   Anesthesia Other Findings Post menopausal bleeding, Thickened endometrium  Reproductive/Obstetrics                            Anesthesia Physical Anesthesia Plan  ASA: III  Anesthesia Plan: General   Post-op Pain Management:    Induction: Intravenous  PONV Risk Score and Plan: 4 or greater and Ondansetron, Dexamethasone, Propofol infusion, Midazolam and Treatment may vary due to age or medical condition  Airway Management Planned: LMA  Additional Equipment:   Intra-op Plan:   Post-operative Plan: Extubation in OR  Informed Consent: I have reviewed the patients History and Physical, chart, labs and discussed the procedure including the risks, benefits and alternatives for the proposed anesthesia with the patient or authorized representative who has indicated his/her understanding and acceptance.     Dental advisory given  Plan Discussed with: CRNA  Anesthesia Plan Comments:        Anesthesia Quick Evaluation

## 2020-06-18 NOTE — Op Note (Signed)
Preoperative Diagnosis: postmenopausal bleeding  Postoperative Diagnosis: postmenopausal bleeding, cervical stenosis  Procedure: Hysteroscopy, dilation and curettage   Surgeon: Dr Gertie Exon  Assistants: None  Anesthesia: General via LMA  EBL: 5 cc  Fluids: 1,000 cc LR  Fluid deficit: 200  Urine output: not recorded  Indications for surgery: The patient is a 54 yo female, who presented with postmenopausal bleeding. Work up included an ultrasound that was suspicious for an endometrial polyp. She was pretreated with vaginal misoprostol secondary to a history of cervical stenosis.  The risks of the surgery were reviewed with the patient and the consent form was signed prior to her surgery.  Findings: EUA: retroverted uterus, no adnexal masses. Her cervix was very stenotic. Hysteroscopy with endocervical vs endometrial polyp, thickened endometrium  Specimens: all sent together as endometrial curetting's.    Procedure: The patient was taken to the operating room with an IV in place. She was placed in the dorsal lithotomy position and anesthesia was administered. She was prepped and draped in the usual sterile fashion for a vaginal procedure. She voided on the way to the OR. A weighted speculum was placed in the vagina and a single tooth tenaculum was placed on the anterior lip of the cervix. The cervix was very difficult to dilate. The mini-hagar dilators were used. Vasopressin (mixture of 20 IU mixed with 100 cc of normal saline) was injected into her cervix, ~2 cc was placed in each quadrant. Care was taken not to inject intravascularly. The cervix was dilated to a #6.7 hagar dilator. The uterus was sounded to ~8 cm. The myosure hysteroscope was inserted into the upper cervix, a polyp was seen and removed. Under direct guidance the hysteroscope was gently pushed passed the internal os and into the uterine cavity. With continuous infusion of normal saline, the uterine cavity was visualized  with the above findings. Secondary to the severe cervical stenosis, the endometrial curettage was done with the [facility] reach with direct visualization. After the manual curettage was done, the myosure was removed. The single tooth tenaculum was removed. The speculum was removed. The patients perineum was cleansed of betadine and she was taken out of the dorsal lithotomy position.  Upon awakening the LMA was removed and the patient was transferred to the recovery room in stable and awake condition.  The sponge and instrument count were correct. There were no complications.

## 2020-06-18 NOTE — Transfer of Care (Signed)
Immediate Anesthesia Transfer of Care Note  Patient: Chelsea Gross  Procedure(s) Performed: DILATATION & CURETTAGE/HYSTEROSCOPY WITH MYOSURE (N/A )  Patient Location: PACU  Anesthesia Type:General  Level of Consciousness: awake, alert , oriented and patient cooperative  Airway & Oxygen Therapy: Patient Spontanous Breathing  Post-op Assessment: Report given to RN and Post -op Vital signs reviewed and stable  Post vital signs: Reviewed and stable  Last Vitals:  Vitals Value Taken Time  BP    Temp    Pulse 72 06/18/20 1008  Resp 13 06/18/20 1008  SpO2 97 % 06/18/20 1008  Vitals shown include unvalidated device data.  Last Pain:  Vitals:   06/18/20 0801  TempSrc: Oral  PainSc: 3       Patients Stated Pain Goal: 3 (06/18/20 0801)  Complications: No complications documented.

## 2020-06-18 NOTE — Discharge Instructions (Signed)
DO NOT TAKE MOTRIN/ADVIL/IBUPROFEN UNTIL AFTER 4:00pm today.   General Anesthesia, Adult, Care After This sheet gives you information about how to care for yourself after your procedure. Your health care provider may also give you more specific instructions. If you have problems or questions, contact your health care provider. What can I expect after the procedure? After the procedure, the following side effects are common:  Pain or discomfort at the IV site.  Nausea.  Vomiting.  Sore throat.  Trouble concentrating.  Feeling cold or chills.  Weak or tired.  Sleepiness and fatigue.  Soreness and body aches. These side effects can affect parts of the body that were not involved in surgery. Follow these instructions at home:  For at least 24 hours after the procedure:  Have a responsible adult stay with you. It is important to have someone help care for you until you are awake and alert.  Rest as needed.  Do not: ? Participate in activities in which you could fall or become injured. ? Drive. ? Use heavy machinery. ? Drink alcohol. ? Take sleeping pills or medicines that cause drowsiness. ? Make important decisions or sign legal documents. ? Take care of children on your own. Eating and drinking  Follow any instructions from your health care provider about eating or drinking restrictions.  When you feel hungry, start by eating small amounts of foods that are soft and easy to digest (bland), such as toast. Gradually return to your regular diet.  Drink enough fluid to keep your urine pale yellow.  If you vomit, rehydrate by drinking water, juice, or clear broth. General instructions  If you have sleep apnea, surgery and certain medicines can increase your risk for breathing problems. Follow instructions from your health care provider about wearing your sleep device: ? Anytime you are sleeping, including during daytime naps. ? While taking prescription pain medicines,  sleeping medicines, or medicines that make you drowsy.  Return to your normal activities as told by your health care provider. Ask your health care provider what activities are safe for you.  Take over-the-counter and prescription medicines only as told by your health care provider.  If you smoke, do not smoke without supervision.  Keep all follow-up visits as told by your health care provider. This is important. Contact a health care provider if:  You have nausea or vomiting that does not get better with medicine.  You cannot eat or drink without vomiting.  You have pain that does not get better with medicine.  You are unable to pass urine.  You develop a skin rash.  You have a fever.  You have redness around your IV site that gets worse. Get help right away if:  You have difficulty breathing.  You have chest pain.  You have blood in your urine or stool, or you vomit blood. Summary  After the procedure, it is common to have a sore throat or nausea. It is also common to feel tired.  Have a responsible adult stay with you for the first 24 hours after general anesthesia. It is important to have someone help care for you until you are awake and alert.  When you feel hungry, start by eating small amounts of foods that are soft and easy to digest (bland), such as toast. Gradually return to your regular diet.  Drink enough fluid to keep your urine pale yellow.  Return to your normal activities as told by your health care provider. Ask your health care provider  what activities are safe for you. This information is not intended to replace advice given to you by your health care provider. Make sure you discuss any questions you have with your health care provider. Document Revised: 07/16/2017 Document Reviewed: 02/26/2017 Elsevier Patient Education  2020 [employer] Dilation and Curettage or Vacuum Curettage, Care After These instructions give you information about caring for  yourself after your procedure. Your doctor may also give you more specific instructions. Call your doctor if you have any problems or questions after your procedure. Follow these instructions at home: Activity  Do not drive or use heavy machinery while taking prescription pain medicine.  For 24 hours after your procedure, avoid driving.  Take short walks often, followed by rest periods. Ask your doctor what activities are safe for you. After one or two days, you may be able to return to your normal activities.  Do not lift anything that is heavier than 10 lb (4.5 kg) until your doctor approves.  For at least 2 weeks, or as long as told by your doctor: ? Do not douche. ? Do not use tampons. ? Do not have sex. General instructions   Take over-the-counter and prescription medicines only as told by your doctor. This is very important if you take blood thinning medicine.  Do not take baths, swim, or use a hot tub until your doctor approves. Take showers instead of baths.  Wear compression stockings as told by your doctor.  It is up to you to get the results of your procedure. Ask your doctor when your results will be ready.  Keep all follow-up visits as told by your doctor. This is important. Contact a doctor if:  You have very bad cramps that get worse or do not get better with medicine.  You have very bad pain in your belly (abdomen).  You cannot drink fluids without throwing up (vomiting).  You get pain in a different part of the area between your belly and thighs (pelvis).  You have bad-smelling discharge from your vagina.  You have a rash. Get help right away if:  You are bleeding a lot from your vagina. A lot of bleeding means soaking more than one sanitary pad in an hour, for 2 hours in a row.  You have clumps of blood (blood clots) coming from your vagina.  You have a fever or chills.  Your belly feels very tender or hard.  You have chest pain.  You have  trouble breathing.  You cough up blood.  You feel dizzy.  You feel light-headed.  You pass out (faint).  You have pain in your neck or shoulder area. Summary  Take short walks often, followed by rest periods. Ask your doctor what activities are safe for you. After one or two days, you may be able to return to your normal activities.  Do not lift anything that is heavier than 10 lb (4.5 kg) until your doctor approves.  Do not take baths, swim, or use a hot tub until your doctor approves. Take showers instead of baths.  Contact your doctor if you have any symptoms of infection, like bad-smelling discharge from your vagina. This information is not intended to replace advice given to you by your health care provider. Make sure you discuss any questions you have with your health care provider. Document Revised: 06/25/2017 Document Reviewed: 03/30/2016 Elsevier Patient Education  2020 [employer].

## 2020-06-18 NOTE — Anesthesia Procedure Notes (Signed)
Procedure Name: LMA Insertion Date/Time: 06/18/2020 9:14 AM Performed by: Bishop Limbo, CRNA Pre-anesthesia Checklist: Patient identified, Emergency Drugs available, Suction available and Patient being monitored Patient Re-evaluated:Patient Re-evaluated prior to induction Oxygen Delivery Method: Circle System Utilized Preoxygenation: Pre-oxygenation with 100% oxygen Induction Type: IV induction Ventilation: Mask ventilation without difficulty LMA: LMA inserted LMA Size: 4.0 Number of attempts: 1 Placement Confirmation: positive ETCO2 Tube secured with: Tape Dental Injury: Teeth and Oropharynx as per pre-operative assessment

## 2020-06-18 NOTE — Interval H&P Note (Signed)
History and Physical Interval Note:  06/18/2020 9:05 AM  Chelsea Gross  has presented today for surgery, with the diagnosis of Post menopausal bleeding, Thickened endometrium.  The various methods of treatment have been discussed with the patient and family. After consideration of risks, benefits and other options for treatment, the patient has consented to  Procedure(s): DILATATION & CURETTAGE/HYSTEROSCOPY WITH MYOSURE (N/A) as a surgical intervention.  The patient's history has been reviewed, patient examined, no change in status, stable for surgery.  I have reviewed the patient's chart and labs.  Questions were answered to the patient's satisfaction.     Romualdo Bolk

## 2020-06-19 ENCOUNTER — Encounter (HOSPITAL_BASED_OUTPATIENT_CLINIC_OR_DEPARTMENT_OTHER): Payer: Self-pay | Admitting: Obstetrics and Gynecology

## 2020-06-21 LAB — SURGICAL PATHOLOGY

## 2020-06-24 ENCOUNTER — Encounter: Payer: Self-pay | Admitting: Obstetrics and Gynecology

## 2020-06-25 ENCOUNTER — Telehealth: Payer: Self-pay

## 2020-06-25 NOTE — Telephone Encounter (Signed)
Spoke with pt. Pt given results and recommendations per Dr Oscar La. Pt states doing well after surgery.   Pt did send mychart message and states that had mild cramping on scale as 2 from 11/23 to 11/28. Did take Naproxen Rx and did resolve sx. Pt states cramping is gone now and she is feeling better.   Mychart message:  Chelsea, Gross [facility] Should I still be cramping after having my procedure six days ago? Its not as bad today as it has been this weekend.    Routing to Dr Oscar La for update and review Encounter closed

## 2020-06-25 NOTE — Telephone Encounter (Signed)
-----   Message from Romualdo Bolk, MD sent at 06/23/2020  4:42 PM EST ----- Please call and check on the patient s/p hysteroscopy, D&C last week. Please inform her of the benign pathology.

## 2020-07-03 ENCOUNTER — Encounter: Payer: Self-pay | Admitting: Obstetrics and Gynecology

## 2020-07-03 ENCOUNTER — Ambulatory Visit (INDEPENDENT_AMBULATORY_CARE_PROVIDER_SITE_OTHER): Payer: 59 | Admitting: Obstetrics and Gynecology

## 2020-07-03 ENCOUNTER — Other Ambulatory Visit: Payer: Self-pay

## 2020-07-03 VITALS — BP 110/74 | HR 90 | Ht 64.0 in

## 2020-07-03 DIAGNOSIS — Z9889 Other specified postprocedural states: Secondary | ICD-10-CM | POA: Diagnosis not present

## 2020-07-03 DIAGNOSIS — N951 Menopausal and female climacteric states: Secondary | ICD-10-CM

## 2020-07-03 MED ORDER — MEDROXYPROGESTERONE ACETATE 5 MG PO TABS: ORAL_TABLET | ORAL | 0 refills | Status: DC

## 2020-07-03 NOTE — Progress Notes (Signed)
GYNECOLOGY  VISIT   HPI: 54 y.o.   Married White or Caucasian Not Hispanic or Latino  female   G1P1001 with No LMP recorded. Patient is perimenopausal.   here for   2 week post opt following D&C . She had an endometrial polyp, benign pathology. She was exhausted for several days after surgery, mild cramping for a few days. Currently feeling fine. She hasn't gone an entire year without a cycle, max of 7-8 months. No significant vasomotor symptoms. She did have an FSH of 61 in 5/20.  GYNECOLOGIC HISTORY: No LMP recorded. Patient is perimenopausal. Contraception: none Menopausal hormone therapy: none         OB History    Gravida  1   Para  1   Term  1   Preterm      AB      Living  1     SAB      TAB      Ectopic      Multiple      Live Births  1              Patient Active Problem List   Diagnosis Date Noted  . 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  . Preoperative cardiovascular examination 05/10/2020  . Anxiety   . GERD (gastroesophageal reflux disease)   . History of palpitations   . Hyperlipemia   . Hypothyroid   . PMB (postmenopausal bleeding)   . Thyroid disease   . Urinary incontinence   . Palpitations 10/13/2019  . Chest discomfort 10/13/2019  . Generalized anxiety disorder 09/21/2018  . Controlled substance agreement signed 09/21/2018  . Depression 04/16/2016  . Hyperlipidemia 04/16/2016  . Hypothyroidism 04/16/2016    Past Medical History:  Diagnosis Date  . Chest discomfort 10/13/2019  . Depression   . Generalized anxiety disorder   . GERD (gastroesophageal reflux disease)   . History of palpitations    MARCH 2021  . Hyperlipidemia 04/16/2016  . Hypothyroidism   . 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  . Palpitations 10/13/2019  . PMB (postmenopausal bleeding)   . Preoperative cardiovascular examination 05/10/2020  . Urinary incontinence     Past Surgical History:  Procedure Laterality Date  . chin and lip surgery  2001   . COLONSCOPY  2018   5 POLYPS REMOVED  . DILATATION & CURETTAGE/HYSTEROSCOPY WITH MYOSURE N/A 06/18/2020   Procedure: DILATATION & CURETTAGE/HYSTEROSCOPY WITH MYOSURE;  Surgeon: Romualdo Bolk, MD;  Location: [facility];  Service: Gynecology;  Laterality: N/A;  . labial repair  2000    Current Outpatient Medications  Medication Sig Dispense Refill  . atorvastatin (LIPITOR) 40 MG tablet Take 1 tablet (40 mg total) by mouth daily at 6 PM. 30 tablet 12  . DULoxetine (CYMBALTA) 60 MG capsule Take 1 capsule (60 mg total) by mouth daily. 90 capsule 3  . levothyroxine (SYNTHROID) 25 MCG tablet TAKE ONE TABLET BY MOUTH DAILY BEFORE BREAKFAST 90 tablet 1  . metoprolol succinate (TOPROL XL) 25 MG 24 hr tablet Take 1 tablet (25 mg total) by mouth daily. 30 tablet 6  . metroNIDAZOLE (METROGEL) 0.75 % gel Apply 1 application topically 2 (two) times daily. For rosacea 45 g 12  . omeprazole (PRILOSEC) 20 MG capsule Take 1 capsule (20 mg total) by mouth daily. 90 capsule 3  . valACYclovir (VALTREX) 500 MG tablet Take 1 tablet (500 mg total) by mouth daily. 30 tablet 12   No current facility-administered medications for this visit.  ALLERGIES: Sulfa antibiotics, Wellbutrin [bupropion], and Pantoprazole  Family History  Problem Relation Age of Onset  . Heart attack Mother   . Esophageal cancer Mother   . Heart attack Father   . Hyperlipidemia Sister   . Thyroid disease Sister   . Hyperlipidemia Sister   . Hyperlipidemia Sister   . Hyperlipidemia Sister     Social History   Socioeconomic History  . Marital status: Married    Spouse name: Not on file  . Number of children: Not on file  . Years of education: Not on file  . Highest education level: Not on file  Occupational History  . Occupation: CMA  Tobacco Use  . Smoking status: Never Smoker  . Smokeless tobacco: Never Used  Vaping Use  . Vaping Use: Never used  Substance and Sexual Activity  . Alcohol use:  Yes    Alcohol/week: 0.0 standard drinks    Comment: RARE  . Drug use: Yes    Comment: cannabis pills or gummies rarely LAST USED  2020 PER PT  . Sexual activity: Yes    Partners: Male    Birth control/protection: None  Other Topics Concern  . Not on file  Social History Narrative  . Not on file   Social Determinants of Health   Financial Resource Strain:   . Difficulty of Paying Living Expenses: Not on file  Food Insecurity:   . Worried About Programme researcher, broadcasting/film/video in the Last Year: Not on file  . Ran Out of Food in the Last Year: Not on file  Transportation Needs:   . Lack of Transportation (Medical): Not on file  . Lack of Transportation (Non-Medical): Not on file  Physical Activity:   . Days of Exercise per Week: Not on file  . Minutes of Exercise per Session: Not on file  Stress:   . Feeling of Stress : Not on file  Social Connections:   . Frequency of Communication with Friends and Family: Not on file  . Frequency of Social Gatherings with Friends and Family: Not on file  . Attends Religious Services: Not on file  . Active Member of Clubs or Organizations: Not on file  . Attends Banker Meetings: Not on file  . Marital Status: Not on file  Intimate Partner Violence:   . Fear of Current or Ex-Partner: Not on file  . Emotionally Abused: Not on file  . Physically Abused: Not on file  . Sexually Abused: Not on file    ROS  PHYSICAL EXAMINATION:    BP 110/74   Pulse 90   Ht 5\' 4"  (1.626 m)   SpO2 99%   BMI 34.30 kg/m     General appearance: alert, cooperative and appears stated age Abdomen: soft, non-tender; non distended, no masses,  no organomegaly    ASSESSMENT S/P hysteroscopy, polypectomy, D&C for abnormal perimenopausal bleeding. Benign pathology    PLAN Will treat with cyclic provera F/U for annual exam in 5/22.

## 2020-08-05 ENCOUNTER — Encounter: Payer: Self-pay | Admitting: Family Medicine

## 2020-09-19 ENCOUNTER — Other Ambulatory Visit: Payer: Self-pay | Admitting: Family Medicine

## 2020-09-22 ENCOUNTER — Other Ambulatory Visit: Payer: Self-pay | Admitting: Family Medicine

## 2020-10-20 ENCOUNTER — Other Ambulatory Visit: Payer: Self-pay | Admitting: Family Medicine

## 2020-11-17 ENCOUNTER — Other Ambulatory Visit: Payer: Self-pay | Admitting: Family Medicine

## 2020-12-02 ENCOUNTER — Ambulatory Visit: Payer: 59 | Admitting: Family Medicine

## 2020-12-06 ENCOUNTER — Ambulatory Visit: Payer: 59 | Admitting: Family Medicine

## 2020-12-09 MED ORDER — METOPROLOL SUCCINATE ER 25 MG PO TB24: 25.0000 mg | ORAL_TABLET | Freq: Every day | ORAL | 2 refills | Status: DC

## 2020-12-17 ENCOUNTER — Other Ambulatory Visit: Payer: Self-pay | Admitting: Family Medicine

## 2020-12-17 DIAGNOSIS — E782 Mixed hyperlipidemia: Secondary | ICD-10-CM

## 2021-01-16 ENCOUNTER — Ambulatory Visit (INDEPENDENT_AMBULATORY_CARE_PROVIDER_SITE_OTHER): Payer: 59 | Admitting: Family Medicine

## 2021-01-16 ENCOUNTER — Other Ambulatory Visit: Payer: Self-pay

## 2021-01-16 ENCOUNTER — Other Ambulatory Visit: Payer: Self-pay | Admitting: Family Medicine

## 2021-01-16 ENCOUNTER — Encounter: Payer: Self-pay | Admitting: Family Medicine

## 2021-01-16 VITALS — BP 135/72 | HR 88 | Temp 97.4°F | Ht 64.0 in | Wt 199.8 lb

## 2021-01-16 DIAGNOSIS — B009 Herpesviral infection, unspecified: Secondary | ICD-10-CM

## 2021-01-16 DIAGNOSIS — N912 Amenorrhea, unspecified: Secondary | ICD-10-CM

## 2021-01-16 DIAGNOSIS — R635 Abnormal weight gain: Secondary | ICD-10-CM | POA: Diagnosis not present

## 2021-01-16 DIAGNOSIS — E782 Mixed hyperlipidemia: Secondary | ICD-10-CM

## 2021-01-16 DIAGNOSIS — R7303 Prediabetes: Secondary | ICD-10-CM

## 2021-01-16 DIAGNOSIS — R002 Palpitations: Secondary | ICD-10-CM

## 2021-01-16 DIAGNOSIS — E034 Atrophy of thyroid (acquired): Secondary | ICD-10-CM

## 2021-01-16 DIAGNOSIS — R232 Flushing: Secondary | ICD-10-CM

## 2021-01-16 DIAGNOSIS — E669 Obesity, unspecified: Secondary | ICD-10-CM

## 2021-01-16 LAB — BAYER DCA HB A1C WAIVED: HB A1C (BAYER DCA - WAIVED): 6.1 % (ref <7.0)

## 2021-01-16 MED ORDER — METFORMIN HCL ER 500 MG PO TB24: 500.0000 mg | ORAL_TABLET | Freq: Every day | ORAL | 3 refills | Status: DC

## 2021-01-16 MED ORDER — DULOXETINE HCL 60 MG PO CPEP: 60.0000 mg | ORAL_CAPSULE | Freq: Every day | ORAL | 12 refills | Status: DC

## 2021-01-16 MED ORDER — VALACYCLOVIR HCL 500 MG PO TABS: 500.0000 mg | ORAL_TABLET | Freq: Every day | ORAL | 12 refills | Status: DC

## 2021-01-16 MED ORDER — ATORVASTATIN CALCIUM 40 MG PO TABS: 40.0000 mg | ORAL_TABLET | Freq: Every day | ORAL | 12 refills | Status: DC

## 2021-01-16 MED ORDER — OMEPRAZOLE 20 MG PO CPDR: 1.0000 | DELAYED_RELEASE_CAPSULE | Freq: Every day | ORAL | 12 refills | Status: DC

## 2021-01-16 NOTE — Progress Notes (Signed)
Subjective: CC: Difficulty with weight loss, hypothyroidism PCP: Janora Norlander, DO ZOX:WRUEA Chelsea Gross is a 55 y.o. female presenting to clinic today for:  1.  Hypothyroidism/ hot flashes Patient is compliant with Synthroid 25 mcg daily.  No reports of change in voice, tremor.  Continues to have some heart palpitations and was placed on metoprolol by her specialist.  She does report intermittent hot flashes and sweating.  She wonders if she is going through menopause.  Women her age at work suffer from similar.  She has been amenorrheic since her uterine ablation.  Has not yet followed up with her OB/GYN but plans to do so  2.  Difficulty with weight loss/ HLD Patient reports that she has been really struggling with her weight.  She has gained some weight.  She notes that in fact she had elevated blood sugars recently into the 120s and did an A1c which showed an A1c of over 6.  She worries about this and really wants to get her self in better shape.  She would be willing to start metformin in efforts to improve things.  She suffers from hyperlipidemia but reports compliance with Lipitor.  No reports of chest pain or shortness of breath.  ROS: Per HPI  Allergies  Allergen Reactions   Sulfa Antibiotics Nausea Only   Wellbutrin [Bupropion]     HEART PAPITATIONS   Pantoprazole Rash   Past Medical History:  Diagnosis Date   Chest discomfort 10/13/2019   Depression    Generalized anxiety disorder    GERD (gastroesophageal reflux disease)    History of palpitations    MARCH 2021   Hyperlipidemia 04/16/2016   Hypothyroidism    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   Palpitations 10/13/2019   PMB (postmenopausal bleeding)    Preoperative cardiovascular examination 05/10/2020   Urinary incontinence     Current Outpatient Medications:    atorvastatin (LIPITOR) 40 MG tablet, Take 1 tablet (40 mg total) by mouth daily. (NEEDS TO BE SEEN BEFORE NEXT REFILL), Disp: 30 tablet,  Rfl: 0   DULoxetine (CYMBALTA) 60 MG capsule, Take 1 capsule (60 mg total) by mouth daily. (NEEDS TO BE SEEN BEFORE NEXT REFILL), Disp: 30 capsule, Rfl: 0   levothyroxine (SYNTHROID) 25 MCG tablet, Take 1 tablet (25 mcg total) by mouth daily before breakfast. (NEEDS TO BE SEEN BEFORE NEXT REFILL), Disp: 30 tablet, Rfl: 0   metoprolol succinate (TOPROL XL) 25 MG 24 hr tablet, Take 1 tablet (25 mg total) by mouth daily., Disp: 90 tablet, Rfl: 2   metroNIDAZOLE (METROGEL) 0.75 % gel, Apply 1 application topically 2 (two) times daily. For rosacea, Disp: 45 g, Rfl: 12   omeprazole (PRILOSEC) 20 MG capsule, Take 1 capsule (20 mg total) by mouth daily. (NEEDS TO BE SEEN BEFORE NEXT REFILL), Disp: 30 capsule, Rfl: 0   valACYclovir (VALTREX) 500 MG tablet, Take 1 tablet (500 mg total) by mouth daily., Disp: 30 tablet, Rfl: 12 Social History   Socioeconomic History   Marital status: Married    Spouse name: Not on file   Number of children: Not on file   Years of education: Not on file   Highest education level: Not on file  Occupational History   Occupation: CMA  Tobacco Use   Smoking status: Never   Smokeless tobacco: Never  Vaping Use   Vaping Use: Never used  Substance and Sexual Activity   Alcohol use: Yes    Alcohol/week: 0.0 standard drinks    Comment: RARE  Drug use: Yes    Comment: cannabis pills or gummies rarely LAST USED  2020 PER PT   Sexual activity: Yes    Partners: Male    Birth control/protection: None  Other Topics Concern   Not on file  Social History Narrative   Not on file   Social Determinants of Health   Financial Resource Strain: Not on file  Food Insecurity: Not on file  Transportation Needs: Not on file  Physical Activity: Not on file  Stress: Not on file  Social Connections: Not on file  Intimate Partner Violence: Not on file   Family History  Problem Relation Age of Onset   Heart attack Mother    Esophageal cancer Mother    Heart attack Father     Hyperlipidemia Sister    Thyroid disease Sister    Hyperlipidemia Sister    Hyperlipidemia Sister    Hyperlipidemia Sister     Objective: Office vital signs reviewed. BP (!) 132/94   Pulse 88   Temp (!) 97.4 F (36.3 C)   Ht 5' 4" (1.626 m)   Wt 199 lb 12.8 oz (90.6 kg)   SpO2 96%   BMI 34.30 kg/m   Physical Examination:  General: Awake, alert, well nourished, obese.  No acute distress HEENT: Normal; sclera white.  No goiter or exophthalmos. Cardio: regular rate and rhythm, S1S2 heard, no murmurs appreciated Pulm: clear to auscultation bilaterally, no wheezes, rhonchi or rales; normal work of breathing on room air Skin: dry; intact; no rashes or lesions; normal temperature Neuro: No tremor  Assessment/ Plan: 55 y.o. female   Hypothyroidism due to acquired atrophy of thyroid - Plan: TSH, T4, Free, CMP14+EGFR  Mixed hyperlipidemia - Plan: LDL Cholesterol, Direct, atorvastatin (LIPITOR) 40 MG tablet  Heart palpitations  Weight gain - Plan: LDL Cholesterol, Direct, Bayer DCA Hb A1c Waived, FSH/LH  Obesity (BMI 30.0-34.9)  Amenorrhea - Plan: FSH/LH  Hot flashes - Plan: FSH/LH  HSV infection - Plan: valACYclovir (VALTREX) 500 MG tablet  Pre-diabetes - Plan: metFORMIN (GLUCOPHAGE XR) 500 MG 24 hr tablet  Having weight gain issues.  Check thyroid levels.  Given nonfasting status we will plan for direct LDL.  Continue Lipitor  Continue beta-blocker for heart palpitations.  Her cardiac exam was unremarkable today.  We discussed consideration for injection therapy for weight loss.  We talked about Ozempic, Mancel Parsons and Mounjaro today.  I am going to plan to see her in a few months for recheck.  I am going to send in metformin in the meantime given impaired fasting glucose with A1c of 6.0 today.  Check FSH, LH given amenorrhea, hot flashes.  Given her age she may in fact be going through menopause.  She has been amenorrheic after ablation so difficult to tell at this time.   We talked about estrogen replacement both orally and topically today.  We also talked about gabapentin as a treatment for vasomotor symptoms of menopause.  She will follow-up with OB/GYN pending lab results.  Chronically suppressed HSV with Valtrex.  This has been renewed.  Orders Placed This Encounter  Procedures   TSH   T4, Free   CMP14+EGFR   No orders of the defined types were placed in this encounter.    Janora Norlander, DO [place] [facility] [phone]

## 2021-01-16 NOTE — Patient Instructions (Signed)
We talked about oral/ topical estrogen replacement and use of gabapentin for hot flashes.  I am glad to start Metformin if you are found to be in pre-diabetic range.  We talked about Ozempic/ Wegovy as once weekly injected medication for diabetes/ weight loss.  There is a new one coming to the market this fall called Mounjaro as well  Check with your insurance to see what the coverage is.

## 2021-01-17 LAB — CMP14+EGFR
ALT: 28 IU/L (ref 0–32)
AST: 18 IU/L (ref 0–40)
Albumin/Globulin Ratio: 1.7 (ref 1.2–2.2)
Albumin: 4.4 g/dL (ref 3.8–4.9)
Alkaline Phosphatase: 150 IU/L — ABNORMAL HIGH (ref 44–121)
BUN/Creatinine Ratio: 13 (ref 9–23)
BUN: 11 mg/dL (ref 6–24)
Bilirubin Total: 0.6 mg/dL (ref 0.0–1.2)
CO2: 26 mmol/L (ref 20–29)
Calcium: 9.3 mg/dL (ref 8.7–10.2)
Chloride: 100 mmol/L (ref 96–106)
Creatinine, Ser: 0.84 mg/dL (ref 0.57–1.00)
Globulin, Total: 2.6 g/dL (ref 1.5–4.5)
Glucose: 97 mg/dL (ref 65–99)
Potassium: 4.1 mmol/L (ref 3.5–5.2)
Sodium: 140 mmol/L (ref 134–144)
Total Protein: 7 g/dL (ref 6.0–8.5)
eGFR: 82 mL/min/{1.73_m2} (ref >59)

## 2021-01-17 LAB — FSH/LH
FSH: 65.9 m[IU]/mL
LH: 51.7 m[IU]/mL

## 2021-01-17 LAB — TSH: TSH: 3.2 u[IU]/mL (ref 0.450–4.500)

## 2021-01-17 LAB — LDL CHOLESTEROL, DIRECT: LDL Direct: 115 mg/dL — ABNORMAL HIGH (ref 0–99)

## 2021-01-17 LAB — T4, FREE: Free T4: 1.18 ng/dL (ref 0.82–1.77)

## 2021-02-23 ENCOUNTER — Encounter: Payer: Self-pay | Admitting: Family Medicine

## 2021-02-23 DIAGNOSIS — E782 Mixed hyperlipidemia: Secondary | ICD-10-CM

## 2021-02-24 MED ORDER — ATORVASTATIN CALCIUM 40 MG PO TABS: 40.0000 mg | ORAL_TABLET | Freq: Every day | ORAL | 12 refills | Status: DC

## 2021-04-07 NOTE — Progress Notes (Signed)
55 y.o. G76P1001 Married White or Caucasian Not Hispanic or Latino female here for annual exam.    She was given vaginal estrogen last year, it did help. She ran out a few months ago.  She c/o a 3 month h/o worsening vasomotor symptoms. Having hot flashes, 1-20 x a day. Some hot flashes at night, sleep isn't disturbed, no sweats. No mood changes. She hasn't tried anything over the counter.   She had a hysteroscopy, polypectomy, D&C in 11/21, pathology was benign.    She has a known right bartholin's cyst, present for years.     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  She c/o a vaginal bulge, wonders if is a rectocele, present since the birth of her daughter. More noticeable lately, only notices it in the bathroom, occasionally needs to reduce the vagina to defecate.    Patient's last menstrual period was 04/16/2020.          Sexually active: Yes.    The current method of family planning is post menopausal status.    Exercising: Yes.    The patient does not participate in regular exercise at present. Smoker:  no  Health Maintenance: Pap:  01-30-16 normal HPV HR neg History of abnormal Pap:  no MMG:  08-22-20 normal BMD:   N/A Colonoscopy: 2019 normal, f/u 10 years TDaP:  2019 Gardasil: N/A   reports that she has never smoked. She has never used smokeless tobacco. She reports current alcohol use. She reports current drug use. Rare ETOH. 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 Daughter graduated from [employer] in 12/20. Will get her MBA at [employer].   Past Medical History:  Diagnosis Date   Chest discomfort 10/13/2019   Depression    Generalized anxiety disorder    GERD (gastroesophageal reflux disease)    History of palpitations    MARCH 2021   Hyperlipidemia 04/16/2016   Hypothyroidism    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   Palpitations 10/13/2019   PMB (postmenopausal bleeding)    Preoperative cardiovascular examination 05/10/2020   Urinary  incontinence     Past Surgical History:  Procedure Laterality Date   chin and lip surgery  2001   COLONSCOPY  2018   5 POLYPS REMOVED   DILATATION & CURETTAGE/HYSTEROSCOPY WITH MYOSURE N/A 06/18/2020   Procedure: DILATATION & CURETTAGE/HYSTEROSCOPY WITH MYOSURE;  Surgeon: Romualdo Bolk, MD;  [facility];  Service: Gynecology;  Laterality: N/A;   labial repair  2000    Current Outpatient Medications  Medication Sig Dispense Refill   atorvastatin (LIPITOR) 40 MG tablet Take 1 tablet (40 mg total) by mouth daily. 30 tablet 12   DULoxetine (CYMBALTA) 60 MG capsule Take 1 capsule (60 mg total) by mouth daily. 30 capsule 12   levothyroxine (SYNTHROID) 25 MCG tablet Take 1 tablet (25 mcg total) by mouth daily before breakfast. 30 tablet 11   metFORMIN (GLUCOPHAGE XR) 500 MG 24 hr tablet Take 1 tablet (500 mg total) by mouth daily with breakfast. 90 tablet 3   metoprolol succinate (TOPROL XL) 25 MG 24 hr tablet Take 1 tablet (25 mg total) by mouth daily. 90 tablet 2   omeprazole (PRILOSEC) 20 MG capsule Take 1 capsule (20 mg total) by mouth daily. (NEEDS TO BE SEEN BEFORE NEXT REFILL) 30 capsule 12   valACYclovir (VALTREX) 500 MG tablet Take 1 tablet (500 mg total) by mouth daily. 30 tablet 12   No current facility-administered medications for this visit.  On toprol for tachycardia  Family History  Problem Relation Age of Onset   Heart attack Mother    Esophageal cancer Mother    Heart attack Father    Hyperlipidemia Sister    Thyroid disease Sister    Hyperlipidemia Sister    Hyperlipidemia Sister    Hyperlipidemia Sister     Review of Systems  All other systems reviewed and are negative.  Exam:   BP 122/72   Pulse 93   Ht 5\' 4"  (1.626 m)   LMP 04/16/2020   SpO2 99%   BMI 34.30 kg/m   Weight change: @WEIGHTCHANGE @ Height:   Height: 5\' 4"  (162.6 cm)  Ht Readings from Last 3 Encounters:  04/09/21 5\' 4"  (1.626 m)  01/16/21 5\' 4"  (1.626 m)   07/03/20 5\' 4"  (1.626 m)    General appearance: alert, cooperative and appears stated age Head: Normocephalic, without obvious abnormality, atraumatic Neck: no adenopathy, supple, symmetrical, trachea midline and thyroid normal to inspection and palpation Lungs: clear to auscultation bilaterally Cardiovascular: regular rate and rhythm Breasts: normal appearance, no masses or tenderness Abdomen: soft, non-tender; non distended,  no masses,  no organomegaly Extremities: extremities normal, atraumatic, no cyanosis or edema Skin: Skin color, texture, turgor normal. No rashes or lesions Lymph nodes: Cervical, supraclavicular, and axillary nodes normal. No abnormal inguinal nodes palpated Neurologic: Grossly normal   Pelvic: External genitalia:  no lesions              Urethra:  normal appearing urethra with no masses, tenderness or lesions              Bartholins and Skenes: normal                 Vagina:atrophic appearing vagina with normal color and discharge, no lesions. She has a small grade 2 rectocele and grade 1 uterine prolapse with valsalva.              Cervix: no lesions               Bimanual Exam:  Uterus:  normal size, contour, position, consistency, mobility, non-tender and retroverted              Adnexa: no mass, fullness, tenderness               Rectovaginal: Confirms               Anus:  normal sphincter tone, no lesions  04/11/21 chaperoned for the exam.  CV risk calculator done, risks are 4.55%  1. Well woman exam Discussed breast self exam Discussed calcium and vit D intake  2. Vasomotor symptoms due to menopause Discussed behavioral changes, over the counter agents. Discussed HRT, no contraindications, risks reviewed - estradiol ([name]-DOT) 0.025 MG/24HR; Place 1 patch onto the skin 2 (two) times a week.  Dispense: 8 patch; Refill: 1 - PROMETRIUM 100 MG capsule; Take 1 capsule (100 mg total) by mouth daily.  Dispense: 90 capsule; Refill: 4 -F/U in one  month  3. Counseling for hormone replacement therapy See above - estradiol ([name]-DOT) 0.025 MG/24HR; Place 1 patch onto the skin 2 (two) times a week.  Dispense: 8 patch; Refill: 1 - PROMETRIUM 100 MG capsule; Take 1 capsule (100 mg total) by mouth daily.  Dispense: 90 capsule; Refill: 4  4. HSV infection Doing well on Valtrex - valACYclovir (VALTREX) 500 MG tablet; Take one tablet a day, increase to one tablet BID x 3 days with an  Dispense: 90 tablet; Refill: 3  5. Screening for cervical cancer - Cytology - PAP  6. Rectocele Minimal symptoms Discussed avoiding heavy lifting and straining Discussed kegels Can use fiber supplements if having trouble emptying her bowel Reach out with worsening symptoms

## 2021-04-09 ENCOUNTER — Other Ambulatory Visit (HOSPITAL_COMMUNITY)
Admission: RE | Admit: 2021-04-09 | Discharge: 2021-04-09 | Disposition: A | Discharge status: Home / Self Care | Payer: 59 | Source: Ambulatory Visit | Attending: Obstetrics and Gynecology | Admitting: Obstetrics and Gynecology

## 2021-04-09 ENCOUNTER — Ambulatory Visit (INDEPENDENT_AMBULATORY_CARE_PROVIDER_SITE_OTHER): Payer: 59 | Admitting: Obstetrics and Gynecology

## 2021-04-09 ENCOUNTER — Other Ambulatory Visit: Payer: Self-pay

## 2021-04-09 ENCOUNTER — Encounter: Payer: Self-pay | Admitting: Obstetrics and Gynecology

## 2021-04-09 VITALS — BP 122/72 | HR 93 | Ht 64.0 in

## 2021-04-09 DIAGNOSIS — Z124 Encounter for screening for malignant neoplasm of cervix: Secondary | ICD-10-CM | POA: Diagnosis present

## 2021-04-09 DIAGNOSIS — L570 Actinic keratosis: Secondary | ICD-10-CM | POA: Insufficient documentation

## 2021-04-09 DIAGNOSIS — B009 Herpesviral infection, unspecified: Secondary | ICD-10-CM

## 2021-04-09 DIAGNOSIS — N951 Menopausal and female climacteric states: Secondary | ICD-10-CM

## 2021-04-09 DIAGNOSIS — N816 Rectocele: Secondary | ICD-10-CM

## 2021-04-09 DIAGNOSIS — Z01419 Encounter for gynecological examination (general) (routine) without abnormal findings: Secondary | ICD-10-CM | POA: Diagnosis not present

## 2021-04-09 DIAGNOSIS — Z7189 Other specified counseling: Secondary | ICD-10-CM | POA: Diagnosis not present

## 2021-04-09 MED ORDER — VALACYCLOVIR HCL 500 MG PO TABS: ORAL_TABLET | ORAL | 3 refills | Status: DC

## 2021-04-09 MED ORDER — ESTRADIOL 0.025 MG/24HR TD PTTW: 1.0000 | MEDICATED_PATCH | TRANSDERMAL | 1 refills | Status: DC

## 2021-04-09 MED ORDER — PROMETRIUM 100 MG PO CAPS
100.0000 mg | ORAL_CAPSULE | Freq: Every day | ORAL | 4 refills | Status: DC
Start: 2021-04-09 — End: 2021-04-10

## 2021-04-09 NOTE — Patient Instructions (Addendum)
Menopause and Hormone Replacement Therapy Menopause is a normal time of life when menstrual periods stop completely and the ovaries stop producing the female hormones estrogen and progesterone. Low levels of these hormones can affect your health and cause symptoms. Hormone replacement therapy (HRT) can relieve some of those symptoms. HRT is the use of artificial (synthetic) hormones to replace hormones that your body has stopped producing because you have reached menopause. Types of HRT HRT may consist of the synthetic hormones estrogen and progestin, or it may consist of estrogen-only therapy. You and your health care provider will decide which form of HRT is best for you. If you choose to be on HRT and you have a uterus, estrogen and progestin are usually prescribed. Estrogen-only therapy is used for women who do not have a uterus. Possible options for taking HRT include: Pills. Patches. Gels. Sprays. Vaginal cream. Vaginal rings. Vaginal inserts. The amount of hormones that you take and how long you take them varies according to your health. It is important to: Begin HRT with the lowest possible dosage. Stop HRT as soon as your health care provider tells you to stop. Work with your health care provider so that you feel informed and comfortable with your decisions. Tell a health care provider about: Any allergies you have. Whether you have had blood clots or know of any risk factors you may have for blood clots. Whether you or family members have had cancer, especially cancer of the breasts, ovaries, or uterus. Any surgeries you have had. All medicines you are taking, including vitamins, herbs, eye drops, creams, and over-the-counter medicines. Whether you are pregnant or may be pregnant. Any medical conditions you have. What are the benefits? HRT can reduce the frequency and severity of menopausal symptoms. Benefits of HRT vary according to the kind of symptoms that you have, how severe  they are, and your overall health. HRT may help to improve the following symptoms of menopause: Hot flashes and night sweats. These are sudden feelings of heat that spread over the face and body. The skin may turn red, like a blush. Night sweats are hot flashes that happen while you are sleeping or trying to sleep. Bone loss (osteoporosis). The body loses calcium more quickly after menopause, causing the bones to become weaker. This can increase the risk for bone breaks (fractures). Vaginal dryness. The lining of the vagina can become thin and dry, which can cause pain during sex or cause infection, burning, or itching. Urinary tract infections. Urinary incontinence. This is the inability to control when you urinate. Irritability. Short-term memory problems. What are the risks? Risks of HRT vary depending on your individual health and medical history. Risks of HRT also depend on whether you receive both estrogen and progestin or you receive estrogen only. HRT may increase the risk of: Spotting. This is when a small amount of blood leaks from the vagina unexpectedly. Endometrial cancer. This cancer is in the lining of the uterus (endometrium). Breast cancer. Increased density of breast tissue. This can make it harder to find breast cancer on a breast X-ray (mammogram). Stroke. Heart disease. Blood clots. Gallbladder disease or liver disease. Risks of HRT can increase if you have any of the following conditions: Endometrial cancer. Liver disease. Heart disease. Breast cancer. History of blood clots. History of stroke. Follow these instructions at home: Pap tests Have Pap tests done as often as told by your health care provider. A Pap test is sometimes called a Pap smear. It is a  screening test that is used to check for signs of cancer of the cervix and vagina. A Pap test can also identify the presence of infection or precancerous changes. Pap tests may be done: Every 3 years, starting at  age 1. Every 5 years, starting after age 21, in combination with testing for human papillomavirus (HPV). More often or less often depending on other medical conditions you have, your age, and other risk factors. It is up to you to get the results of your Pap test. Ask your health care provider, or the department that is doing the test, when your results will be ready. General instructions Take over-the-counter and prescription medicines only as told by your health care provider. Do not use any products that contain nicotine or tobacco. These products include cigarettes, chewing tobacco, and vaping devices, such as e-cigarettes. If you need help quitting, ask your health care provider. Get mammograms, pelvic exams, and medical checkups as often as told by your health care provider. Keep all follow-up visits. This is important. Contact a health care provider if you have: Pain or swelling in your legs. Lumps or changes in your breasts or armpits. Pain, burning, or bleeding when you urinate. Unusual vaginal bleeding. Dizziness or headaches. Pain in your abdomen. Get help right away if you have: Shortness of breath. Chest pain. Slurred speech. Weakness or numbness in any part of your arms or legs. These symptoms may represent a serious problem that is an emergency. Do not wait to see if the symptoms will go away. Get medical help right away. Call your local emergency services (911 in the [place] Do not drive yourself to the hospital. Summary Menopause is a normal time of life when menstrual periods stop completely and the ovaries stop producing the female hormones estrogen and progesterone. HRT can reduce the frequency and severity of menopausal symptoms. Risks of HRT vary depending on your individual health and medical history. This information is not intended to replace advice given to you by your health care provider. Make sure you discuss any questions you have with your health care  provider. Document Revised: 01/15/2020 Document Reviewed: 01/15/2020 Elsevier Patient Education  2022 Elsevier Inc.  EXERCISE   We recommended that you start or continue a regular exercise program for good health. Physical activity is anything that gets your body moving, some is better than none. The CDC recommends 150 minutes per week of Moderate-Intensity Aerobic Activity and 2 or more days of Muscle Strengthening Activity.  Benefits of exercise are limitless: helps weight loss/weight maintenance, improves mood and energy, helps with depression and anxiety, improves sleep, tones and strengthens muscles, improves balance, improves bone density, protects from chronic conditions such as heart disease, high blood pressure and diabetes and so much more. To learn more visit: http://kirby-bean.org/  DIET: Good nutrition starts with a healthy diet of fruits, vegetables, whole grains, and lean protein sources. Drink plenty of water for hydration. Minimize empty calories, sodium, sweets. For more information about dietary recommendations visit: CriticalGas.be and https://www.carpenter-henry.info/  ALCOHOL:  Women should limit their alcohol intake to no more than 7 drinks/beers/glasses of wine (combined, not each!) per week. Moderation of alcohol intake to this level decreases your risk of breast cancer and liver damage.  If you are concerned that you may have a problem, or your friends have told you they are concerned about your drinking, there are many resources to help. A well-known program that is free, effective, and available to all people all over the nation is  Alcoholics Anonymous.  Check out this site to learn more: BeverageBargains.co.za   CALCIUM AND VITAMIN D:  Adequate intake of calcium and Vitamin D are recommended for bone health.  You should be getting between 1000-1200 mg of calcium and 800 units of Vitamin D daily  between diet and supplements  PAP SMEARS:  Pap smears, to check for cervical cancer or precancers,  have traditionally been done yearly, scientific advances have shown that most women can have pap smears less often.  However, every woman still should have a physical exam from her gynecologist every year. It will include a breast check, inspection of the vulva and vagina to check for abnormal growths or skin changes, a visual exam of the cervix, and then an exam to evaluate the size and shape of the uterus and ovaries. We will also provide age appropriate advice regarding health maintenance, like when you should have certain vaccines, screening for sexually transmitted diseases, bone density testing, colonoscopy, mammograms, etc.   MAMMOGRAMS:  All women over 53 years old should have a routine mammogram.   COLON CANCER SCREENING: Now recommend starting at age 82. At this time colonoscopy is not covered for routine screening until 50. There are take home tests that can be done between 45-49.   COLONOSCOPY:  Colonoscopy to screen for colon cancer is recommended for all women at age 33.  We know, you hate the idea of the prep.  We agree, BUT, having colon cancer and not knowing it is worse!!  Colon cancer so often starts as a polyp that can be seen and removed at colonscopy, which can quite literally save your life!  And if your first colonoscopy is normal and you have no family history of colon cancer, most women don't have to have it again for 10 years.  Once every ten years, you can do something that may end up saving your life, right?  We will be happy to help you get it scheduled when you are ready.  Be sure to check your insurance coverage so you understand how much it will cost.  It may be covered as a preventative service at no cost, but you should check your particular policy.      Breast Self-Awareness Breast self-awareness means being familiar with how your breasts look and feel. It involves  checking your breasts regularly and reporting any changes to your health care provider. Practicing breast self-awareness is important. A change in your breasts can be a sign of a serious medical problem. Being familiar with how your breasts look and feel allows you to find any problems early, when treatment is more likely to be successful. All women should practice breast self-awareness, including women who have had breast implants. How to do a breast self-exam One way to learn what is normal for your breasts and whether your breasts are changing is to do a breast self-exam. To do a breast self-exam: Look for Changes  Remove all the clothing above your waist. Stand in front of a mirror in a room with good lighting. Put your hands on your hips. Push your hands firmly downward. Compare your breasts in the mirror. Look for differences between them (asymmetry), such as: Differences in shape. Differences in size. Puckers, dips, and bumps in one breast and not the other. Look at each breast for changes in your skin, such as: Redness. Scaly areas. Look for changes in your nipples, such as: Discharge. Bleeding. Dimpling. Redness. A change in position. Feel for Changes Carefully  feel your breasts for lumps and changes. It is best to do this while lying on your back on the floor and again while sitting or standing in the shower or tub with soapy water on your skin. Feel each breast in the following way: Place the arm on the side of the breast you are examining above your head. Feel your breast with the other hand. Start in the nipple area and make  inch (2 cm) overlapping circles to feel your breast. Use the pads of your three middle fingers to do this. Apply light pressure, then medium pressure, then firm pressure. The light pressure will allow you to feel the tissue closest to the skin. The medium pressure will allow you to feel the tissue that is a little deeper. The firm pressure will allow you  to feel the tissue close to the ribs. Continue the overlapping circles, moving downward over the breast until you feel your ribs below your breast. Move one finger-width toward the center of the body. Continue to use the  inch (2 cm) overlapping circles to feel your breast as you move slowly up toward your collarbone. Continue the up and down exam using all three pressures until you reach your armpit.  Write Down What You Find  Write down what is normal for each breast and any changes that you find. Keep a written record with breast changes or normal findings for each breast. By writing this information down, you do not need to depend only on memory for size, tenderness, or location. Write down where you are in your menstrual cycle, if you are still menstruating. If you are having trouble noticing differences in your breasts, do not get discouraged. With time you will become more familiar with the variations in your breasts and more comfortable with the exam. How often should I examine my breasts? Examine your breasts every month. If you are breastfeeding, the best time to examine your breasts is after a feeding or after using a breast pump. If you menstruate, the best time to examine your breasts is 5-7 days after your period is over. During your period, your breasts are lumpier, and it may be more difficult to notice changes. When should I see my health care provider? See your health care provider if you notice: A change in shape or size of your breasts or nipples. A change in the skin of your breast or nipples, such as a reddened or scaly area. Unusual discharge from your nipples. A lump or thick area that was not there before. Pain in your breasts. Anything that concerns you. About Rectocele  Overview  A rectocele is a type of hernia which causes different degrees of bulging of the rectal tissues into the vaginal wall.  You may even notice that it presses against the vaginal wall so much  that some vaginal tissues droop outside of the opening of your vagina.  Causes of Rectocele  The most common cause is childbirth.  The muscles and ligaments in the pelvis that hold up and support the female organs and vagina become stretched and weakened during labor and delivery.  The more babies you have, the more the support tissues are stretched and weakened.  Not everyone who has a baby will develop a rectocele.  Some women have stronger supporting tissue in the pelvis and may not have as much of a problem as others.  Women who have a Cesarean section usually do not get rectocele's unless they pushed a long time  prior to the cesarean delivery.  Other conditions that can cause a rectocele include chronic constipation, a chronic cough, a lot of heavy lifting, and obesity.  Older women may have this problem because the loss of female hormones causes the vaginal tissue to become weaker.  Symptoms  There may not be any symptoms.  If you do have symptoms, they may include: Pelvic pressure in the rectal area Protrusion of the lower part of the vagina through the opening of the vagina Constipation and trapping of the stool, making it difficult to have a bowel movement.  In severe cases, you may have to press on the lower part of your vagina to help push the stool out of you rectum.  This is called splinting to empty.  Diagnosing Rectocele  Your health care provider will ask about your symptoms and perform a pelvic exam.  S/he will ask you to bear down, pushing like you are having a bowel movement so as to see how far the lower part of the vagina protrudes into the vagina and possible outside of the vagina.  Your provider will also ask you to contract the muscles of your pelvis (like you are stopping the stream in the middle of urinating) to determine the strength of your pelvic muscles.  Your provider may also do a rectal exam.  Treatment Options  If you do not have any symptoms, no treatment may  be necessary.  Other treatment options include: Pelvic floor exercises: Contracting the muscles in your genital area may help strengthen your muscles and support the organs.  Be sure to get proper exercise instruction from you physical therapist. A pessary (removealbe pelvic support device) sometimes helps rectocele symptoms. Surgery: Surgical repair may be necessary. In some cases the uterus may need to be taken out ( a hysterectomy) as well.  There are many types of surgery for pelvic support problems.  Look for physicians who specialize in repair procedures.  You can take care of yourself by: Treating and preventing constipation Avoiding heavy lifting, and lifting correctly (with your legs, not with you waist or back) Treating a chronic cough or bronchitis Not smoking avoiding too much weight gain Doing pelvic floor exercises   2007, [employer] Doc.33

## 2021-04-10 ENCOUNTER — Encounter: Payer: Self-pay | Admitting: Obstetrics and Gynecology

## 2021-04-10 MED ORDER — PROGESTERONE MICRONIZED 100 MG PO CAPS: ORAL_CAPSULE | ORAL | 4 refills | Status: DC

## 2021-04-11 ENCOUNTER — Ambulatory Visit: Payer: 59 | Admitting: Obstetrics and Gynecology

## 2021-04-14 LAB — CYTOLOGY - PAP
Comment: NEGATIVE
Diagnosis: NEGATIVE
High risk HPV: NEGATIVE

## 2021-04-17 ENCOUNTER — Encounter: Payer: Self-pay | Admitting: Family Medicine

## 2021-04-18 ENCOUNTER — Ambulatory Visit: Payer: 59 | Admitting: Family Medicine

## 2021-05-06 ENCOUNTER — Other Ambulatory Visit: Payer: Self-pay | Admitting: Cardiology

## 2021-05-06 DIAGNOSIS — Z01810 Encounter for preprocedural cardiovascular examination: Secondary | ICD-10-CM

## 2021-05-06 DIAGNOSIS — R000 Tachycardia, unspecified: Secondary | ICD-10-CM

## 2021-05-07 ENCOUNTER — Other Ambulatory Visit: Payer: Self-pay

## 2021-05-07 ENCOUNTER — Ambulatory Visit (INDEPENDENT_AMBULATORY_CARE_PROVIDER_SITE_OTHER): Payer: 59 | Admitting: Obstetrics and Gynecology

## 2021-05-07 ENCOUNTER — Encounter: Payer: Self-pay | Admitting: Obstetrics and Gynecology

## 2021-05-07 VITALS — BP 110/72 | HR 68

## 2021-05-07 DIAGNOSIS — N951 Menopausal and female climacteric states: Secondary | ICD-10-CM

## 2021-05-07 MED ORDER — ESTRADIOL 0.0375 MG/24HR TD PTTW: 1.0000 | MEDICATED_PATCH | TRANSDERMAL | 3 refills | Status: DC

## 2021-05-07 NOTE — Patient Instructions (Signed)
Try uberlube for vaginal lubrication. Another option is astroglide (with silicone).

## 2021-05-07 NOTE — Progress Notes (Signed)
GYNECOLOGY  VISIT   HPI: 55 y.o.   Married White or Caucasian Not Hispanic or Latino  female   [id number] with Patient's last menstrual period was 04/16/2020.   here for follow up HRT.  She was started on the vivelle dot 0.025 m and prometrium. Patient states that she is feeling better. She is having less hot flashes.  She has gone from having hot flashes a couple of times an hour for a couple of times a day. Also less intense.  Not having night sweats. Sleep varies, no change with the prometrium.  Vaginal dryness has improved.  No vaginal bleeding.  GYNECOLOGIC HISTORY: Patient's last menstrual period was 04/16/2020. Contraception:pmp  Menopausal hormone therapy: yes, progesterone, estradiol patch.         OB History     Gravida  1   Para  1   Term  1   Preterm      AB      Living  1      SAB      IAB      Ectopic      Multiple      Live Births  1              Patient Active Problem List   Diagnosis Date Noted   Actinic keratosis 04/09/2021   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   Preoperative cardiovascular examination 05/10/2020   Anxiety    GERD (gastroesophageal reflux disease)    History of palpitations    Hyperlipemia    Hypothyroid    PMB (postmenopausal bleeding)    Thyroid disease    Urinary incontinence    Palpitations 10/13/2019   Chest discomfort 10/13/2019   Generalized anxiety disorder 09/21/2018   Controlled substance agreement signed 09/21/2018   Depression 04/16/2016   Hyperlipidemia 04/16/2016   Hypothyroidism 04/16/2016    Past Medical History:  Diagnosis Date   Chest discomfort 10/13/2019   Depression    Generalized anxiety disorder    GERD (gastroesophageal reflux disease)    History of palpitations    MARCH 2021   Hyperlipidemia 04/16/2016   Hypothyroidism    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   Palpitations 10/13/2019   PMB (postmenopausal bleeding)    Preoperative cardiovascular examination 05/10/2020    Urinary incontinence     Past Surgical History:  Procedure Laterality Date   chin and lip surgery  2001   COLONSCOPY  2018   5 POLYPS REMOVED   DILATATION & CURETTAGE/HYSTEROSCOPY WITH MYOSURE N/A 06/18/2020   Procedure: DILATATION & CURETTAGE/HYSTEROSCOPY WITH MYOSURE;  Surgeon: Romualdo Bolk, MD;  Location: [facility] [place];  Service: Gynecology;  Laterality: N/A;   labial repair  2000    Current Outpatient Medications  Medication Sig Dispense Refill   atorvastatin (LIPITOR) 40 MG tablet Take 1 tablet (40 mg total) by mouth daily. 30 tablet 12   DULoxetine (CYMBALTA) 60 MG capsule Take 1 capsule (60 mg total) by mouth daily. 30 capsule 12   estradiol ([name]-DOT) 0.025 MG/24HR Place 1 patch onto the skin 2 (two) times a week. 8 patch 1   levothyroxine (SYNTHROID) 25 MCG tablet Take 1 tablet (25 mcg total) by mouth daily before breakfast. 30 tablet 11   metFORMIN (GLUCOPHAGE XR) 500 MG 24 hr tablet Take 1 tablet (500 mg total) by mouth daily with breakfast. 90 tablet 3   metoprolol succinate (TOPROL XL) 25 MG 24 hr tablet Take 1 tablet (25 mg total) by mouth daily. 90 tablet 2  omeprazole (PRILOSEC) 20 MG capsule Take 1 capsule (20 mg total) by mouth daily. (NEEDS TO BE SEEN BEFORE NEXT REFILL) 30 capsule 12   progesterone (PROMETRIUM) 100 MG capsule Take one tablet by mouth daily at bedtime. 90 capsule 4   valACYclovir (VALTREX) 500 MG tablet Take one tablet a day, increase to one tablet BID x 3 days with an outbreak 90 tablet 3   No current facility-administered medications for this visit.     ALLERGIES: Sulfa antibiotics, Wellbutrin [bupropion], and Pantoprazole  Family History  Problem Relation Age of Onset   Heart attack Mother    Esophageal cancer Mother    Heart attack Father    Hyperlipidemia Sister    Thyroid disease Sister    Hyperlipidemia Sister    Hyperlipidemia Sister    Hyperlipidemia Sister     Social History   Socioeconomic History    Marital status: Married    Spouse name: Not on file   Number of children: Not on file   Years of education: Not on file   Highest education level: Not on file  Occupational History   Occupation: CMA  Tobacco Use   Smoking status: Never   Smokeless tobacco: Never  Vaping Use   Vaping Use: Never used  Substance and Sexual Activity   Alcohol use: Yes    Alcohol/week: 0.0 standard drinks    Comment: RARE   Drug use: Yes    Comment: cannabis pills or gummies rarely LAST USED  2020 PER PT   Sexual activity: Yes    Partners: Male    Birth control/protection: None  Other Topics Concern   Not on file  Social History Narrative   Not on file   Social Determinants of Health   Financial Resource Strain: Not on file  Food Insecurity: Not on file  Transportation Needs: Not on file  Physical Activity: Not on file  Stress: Not on file  Social Connections: Not on file  Intimate Partner Violence: Not on file    Review of Systems  All other systems reviewed and are negative.  PHYSICAL EXAMINATION:    BP 110/72   Pulse 68   LMP 04/16/2020   SpO2 99%     General appearance: alert, cooperative and appears stated age  59. Hot flashes due to menopause Much improved with the 0.025 mg estrogen patch, would like to increase to the next dose.  - estradiol ([name]-DOT) 0.0375 MG/24HR; Place 1 patch onto the skin 2 (two) times a week.  Dispense: 24 patch; Refill: 3 -She has prometrium, will continue.

## 2021-06-22 ENCOUNTER — Other Ambulatory Visit: Payer: Self-pay | Admitting: Cardiology

## 2021-07-07 ENCOUNTER — Other Ambulatory Visit: Payer: Self-pay | Admitting: Family Medicine

## 2021-07-08 LAB — SARS CORONAVIRUS 2 (TAT 6-24 HRS): SARS Coronavirus 2: NEGATIVE

## 2021-08-19 ENCOUNTER — Encounter: Payer: Self-pay | Admitting: Family Medicine

## 2021-08-19 ENCOUNTER — Ambulatory Visit (INDEPENDENT_AMBULATORY_CARE_PROVIDER_SITE_OTHER): Payer: PRIVATE HEALTH INSURANCE | Admitting: Family Medicine

## 2021-08-19 VITALS — BP 107/73 | HR 84 | Ht 64.0 in

## 2021-08-19 DIAGNOSIS — M5416 Radiculopathy, lumbar region: Secondary | ICD-10-CM

## 2021-08-19 MED ORDER — METHYLPREDNISOLONE ACETATE 40 MG/ML IJ SUSP
40.0000 mg | Freq: Once | INTRAMUSCULAR | Status: AC
  Administered 2021-08-19: 15:00:00 40 mg via INTRAMUSCULAR

## 2021-08-19 NOTE — Progress Notes (Addendum)
Subjective:  Patient ID: Chelsea Gross, female    DOB: 05/07/1966, 56 y.o.   MRN: [id number]  Patient Care Team: Raliegh IpGottschalk, Ashly M, DO as PCP - General (Family Medicine)   Chief Complaint:  Back Pain   HPI: Chelsea Gross is a 56 y.o. female presenting on 08/19/2021 for Back Pain   Back Pain This is a new problem. Episode onset: Saturday. The problem occurs constantly. The problem has been waxing and waning since onset. The pain is present in the lumbar spine, sacro-iliac and gluteal. The quality of the pain is described as aching, burning and shooting. The pain radiates to the left thigh. The pain is at a severity of 8/10. The pain is moderate. The symptoms are aggravated by twisting, standing, sitting and position. Associated symptoms include leg pain, paresthesias and tingling. Pertinent negatives include no abdominal pain, bladder incontinence, bowel incontinence, chest pain, dysuria, fever, headaches, numbness, paresis, pelvic pain, perianal numbness, weakness or weight loss. She has tried NSAIDs for the symptoms. The treatment provided no relief.    Relevant past medical, surgical, family, and social history reviewed and updated as indicated.  Allergies and medications reviewed and updated. Data reviewed: Chart in Epic.   Past Medical History:  Diagnosis Date   Chest discomfort 10/13/2019   Depression    Generalized anxiety disorder    GERD (gastroesophageal reflux disease)    History of palpitations    MARCH 2021   Hyperlipidemia 04/16/2016   Hypothyroidism    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   Palpitations 10/13/2019   PMB (postmenopausal bleeding)    Preoperative cardiovascular examination 05/10/2020   Urinary incontinence     Past Surgical History:  Procedure Laterality Date   chin and lip surgery  2001   COLONSCOPY  2018   5 POLYPS REMOVED   DILATATION & CURETTAGE/HYSTEROSCOPY WITH MYOSURE N/A 06/18/2020   Procedure: DILATATION &  CURETTAGE/HYSTEROSCOPY WITH MYOSURE;  Surgeon: Romualdo BolkJertson, Jill Evelyn, MD;  [facility] ;  Service: Gynecology;  Laterality: N/A;   labial repair  2000    Social History   Socioeconomic History   Marital status: Married    Spouse name: Not on file   Number of children: Not on file   Years of education: Not on file   Highest education level: Not on file  Occupational History   Occupation: CMA  Tobacco Use   Smoking status: Never   Smokeless tobacco: Never  Vaping Use   Vaping Use: Never used  Substance and Sexual Activity   Alcohol use: Yes    Alcohol/week: 0.0 standard drinks    Comment: RARE   Drug use: Yes    Comment: cannabis pills or gummies rarely LAST USED  2020 PER PT   Sexual activity: Yes    Partners: Male    Birth control/protection: None  Other Topics Concern   Not on file  Social History Narrative   Not on file   Social Determinants of Health   Financial Resource Strain: Not on file  Food Insecurity: Not on file  Transportation Needs: Not on file  Physical Activity: Not on file  Stress: Not on file  Social Connections: Not on file  Intimate Partner Violence: Not on file    Outpatient Encounter Medications as of 08/19/2021  Medication Sig   atorvastatin (LIPITOR) 40 MG tablet Take 1 tablet (40 mg total) by mouth daily.   DULoxetine (CYMBALTA) 60 MG capsule Take 1 capsule (60 mg total) by mouth daily.  estradiol ([name]-DOT) 0.0375 MG/24HR Place 1 patch onto the skin 2 (two) times a week.   levothyroxine (SYNTHROID) 25 MCG tablet Take 1 tablet (25 mcg total) by mouth daily before breakfast.   metFORMIN (GLUCOPHAGE XR) 500 MG 24 hr tablet Take 1 tablet (500 mg total) by mouth daily with breakfast.   metoprolol succinate (TOPROL-XL) 25 MG 24 hr tablet TAKE ONE TABLET BY MOUTH DAILY   omeprazole (PRILOSEC) 20 MG capsule Take 1 capsule (20 mg total) by mouth daily. (NEEDS TO BE SEEN BEFORE NEXT REFILL)   progesterone (PROMETRIUM) 100  MG capsule Take one tablet by mouth daily at bedtime.   valACYclovir (VALTREX) 500 MG tablet Take one tablet a day, increase to one tablet BID x 3 days with an outbreak   Facility-Administered Encounter Medications as of 08/19/2021  Medication   methylPREDNISolone acetate (DEPO-MEDROL) injection 40 mg    Allergies  Allergen Reactions   Sulfa Antibiotics Nausea Only   Wellbutrin [Bupropion]     HEART PAPITATIONS   Pantoprazole Rash    Review of Systems  Constitutional:  Negative for activity change, appetite change, chills, diaphoresis, fatigue, fever, unexpected weight change and weight loss.  Respiratory:  Negative for cough and shortness of breath.   Cardiovascular:  Negative for chest pain, palpitations and leg swelling.  Gastrointestinal:  Negative for abdominal pain and bowel incontinence.  Genitourinary:  Negative for bladder incontinence, decreased urine volume, difficulty urinating, dysuria and pelvic pain.  Musculoskeletal:  Positive for arthralgias and back pain. Negative for gait problem, joint swelling, myalgias, neck pain and neck stiffness.  Neurological:  Positive for tingling and paresthesias. Negative for dizziness, tremors, seizures, syncope, facial asymmetry, speech difficulty, weakness, light-headedness, numbness and headaches.  Psychiatric/Behavioral:  Negative for confusion.   All other systems reviewed and are negative.      Objective:  BP 107/73    Pulse 84    Ht 5\' 4"  (1.626 m)    LMP 04/16/2020    SpO2 95%    BMI 34.30 kg/m    Wt Readings from Last 3 Encounters:  01/16/21 199 lb 12.8 oz (90.6 kg)  06/18/20 199 lb 12.8 oz (90.6 kg)  05/29/20 165 lb (74.8 kg)    Physical Exam Vitals and nursing note reviewed.  Constitutional:      General: She is not in acute distress.    Appearance: Normal appearance. She is well-developed and well-groomed. She is obese. She is not ill-appearing, toxic-appearing or diaphoretic.  HENT:     Head: Normocephalic and  atraumatic.     Jaw: There is normal jaw occlusion.     Right Ear: Hearing normal.     Left Ear: Hearing normal.     Nose: Nose normal.     Mouth/Throat:     Lips: Pink.     Mouth: Mucous membranes are moist.     Pharynx: Oropharynx is clear. Uvula midline.  Eyes:     General: Lids are normal.     Extraocular Movements: Extraocular movements intact.     Conjunctiva/sclera: Conjunctivae normal.     Pupils: Pupils are equal, round, and reactive to light.  Neck:     Thyroid: No thyroid mass, thyromegaly or thyroid tenderness.     Vascular: No carotid bruit or JVD.     Trachea: Trachea and phonation normal.  Cardiovascular:     Rate and Rhythm: Normal rate and regular rhythm.     Chest Wall: PMI is not displaced.     Pulses: Normal pulses.  Heart sounds: Normal heart sounds. No murmur heard.   No friction rub. No gallop.  Pulmonary:     Effort: Pulmonary effort is normal. No respiratory distress.     Breath sounds: Normal breath sounds. No wheezing.  Abdominal:     General: Bowel sounds are normal. There is no abdominal bruit.     Palpations: Abdomen is soft. There is no hepatomegaly or splenomegaly.  Musculoskeletal:     Cervical back: Normal, normal range of motion and neck supple.     Thoracic back: Normal.     Lumbar back: Tenderness present. No swelling, edema, deformity, signs of trauma, lacerations, spasms or bony tenderness. Normal range of motion. Positive left straight leg raise test. Negative right straight leg raise test. No scoliosis.       Back:     Right hip: Normal.     Left hip: Normal.     Right lower leg: No edema.     Left lower leg: No edema.  Lymphadenopathy:     Cervical: No cervical adenopathy.  Skin:    General: Skin is warm and dry.     Capillary Refill: Capillary refill takes less than 2 seconds.     Coloration: Skin is not cyanotic, jaundiced or pale.     Findings: No rash.  Neurological:     General: No focal deficit present.     Mental  Status: She is alert and oriented to person, place, and time.     Sensory: Sensation is intact.     Motor: Motor function is intact.     Coordination: Coordination is intact.     Gait: Gait is intact.     Deep Tendon Reflexes: Reflexes are normal and symmetric.  Psychiatric:        Attention and Perception: Attention and perception normal.        Mood and Affect: Mood and affect normal.        Speech: Speech normal.        Behavior: Behavior normal. Behavior is cooperative.        Thought Content: Thought content normal.        Cognition and Memory: Cognition and memory normal.        Judgment: Judgment normal.    Results for orders placed or performed in visit on 07/07/21  SARS Coronavirus 2 (TAT 6-24 hrs)  Result Value Ref Range   SARS Coronavirus 2 RESULT: NEGATIVE        Pertinent labs & imaging results that were available during my care of the patient were reviewed by me and considered in my medical decision making.  Assessment & Plan:  Rochelle was seen today for back pain.  Diagnoses and all orders for this visit:  Lumbar back pain with radiculopathy affecting left lower extremity Low back pain with radiation to LLE, no red flags concerning for cauda equina syndrome. Declines referral to PT. Will burst with steroids. Pt aware to take Aleve twice daily with food for 14 days only. Sciatica exercises discussed in detail and handout provided. Pt aware to report new, worsening, or persistent symptoms. Follow up in 4-6 weeks for reevaluation. -     methylPREDNISolone acetate (DEPO-MEDROL) injection 40 mg     Continue all other maintenance medications.  Follow up plan: Return in about 4 weeks (around 09/16/2021), or if symptoms worsen or fail to improve, for LBP.   Continue healthy lifestyle choices, including diet (rich in fruits, vegetables, and lean proteins, and low in salt and  simple carbohydrates) and exercise (at least 30 minutes of moderate physical activity  daily).  Educational handout given for sciatica and sciatica rehab exercises  The above assessment and management plan was discussed with the patient. The patient verbalized understanding of and has agreed to the management plan. Patient is aware to call the clinic if they develop any new symptoms or if symptoms persist or worsen. Patient is aware when to return to the clinic for a follow-up visit. Patient educated on when it is appropriate to go to the emergency department.   Kari BaarsMichelle Kaiven Vester, FNP-C [facility] [phone]

## 2021-08-21 ENCOUNTER — Encounter: Payer: Self-pay | Admitting: Family Medicine

## 2021-08-21 ENCOUNTER — Other Ambulatory Visit: Payer: Self-pay | Admitting: Family Medicine

## 2021-08-21 ENCOUNTER — Ambulatory Visit: Payer: Self-pay | Admitting: Family Medicine

## 2021-09-08 ENCOUNTER — Encounter: Payer: Self-pay | Admitting: Obstetrics and Gynecology

## 2021-09-18 ENCOUNTER — Encounter: Payer: Self-pay | Admitting: Obstetrics and Gynecology

## 2021-09-18 DIAGNOSIS — R7303 Prediabetes: Secondary | ICD-10-CM | POA: Insufficient documentation

## 2021-09-23 ENCOUNTER — Encounter: Payer: Self-pay | Admitting: Family Medicine

## 2021-09-25 MED ORDER — MEDROXYPROGESTERONE ACETATE 2.5 MG PO TABS: 2.5000 mg | ORAL_TABLET | Freq: Every day | ORAL | 1 refills | Status: DC

## 2021-09-25 NOTE — Addendum Note (Signed)
Addended by: Tobi Bastos on: 09/25/2021 01:12 PM ? ? Modules accepted: Orders ? ?

## 2021-10-01 ENCOUNTER — Ambulatory Visit: Payer: PRIVATE HEALTH INSURANCE | Admitting: Family Medicine

## 2021-10-12 ENCOUNTER — Encounter: Payer: Self-pay | Admitting: Obstetrics and Gynecology

## 2021-10-12 DIAGNOSIS — N926 Irregular menstruation, unspecified: Secondary | ICD-10-CM

## 2021-10-13 NOTE — Telephone Encounter (Signed)
Please let the patient know that missing her progesterone could be associated with spotting, but she still shouldn't be bleeding. I would recommend an ultrasound. Please set up an ultrasound and visit with me.  ?She should continue to take her progesterone daily.  ?

## 2021-10-13 NOTE — Telephone Encounter (Signed)
Appointments please set up pelvic ultrasound and visit with Dr.Jertson.  ?

## 2021-10-14 NOTE — Telephone Encounter (Signed)
Patient read my chart message "Last read by Delphia Grates at  2:43 PM on 10/13/2021." ? ?Ultrasound scheduled on 11/06/21 ?

## 2021-11-06 ENCOUNTER — Other Ambulatory Visit: Payer: PRIVATE HEALTH INSURANCE

## 2021-11-06 ENCOUNTER — Other Ambulatory Visit: Payer: PRIVATE HEALTH INSURANCE | Admitting: Obstetrics and Gynecology

## 2021-11-11 ENCOUNTER — Telehealth: Payer: Self-pay | Admitting: *Deleted

## 2021-11-11 NOTE — Telephone Encounter (Signed)
Patient called and left message in triage voicemail stating her pelvic ultrasound is scheduled on 12/11/21, asked if okay to be schedule this far out. I left message for patient to call. ?

## 2021-12-11 ENCOUNTER — Encounter: Payer: Self-pay | Admitting: Obstetrics and Gynecology

## 2021-12-11 ENCOUNTER — Ambulatory Visit (INDEPENDENT_AMBULATORY_CARE_PROVIDER_SITE_OTHER): Payer: PRIVATE HEALTH INSURANCE

## 2021-12-11 ENCOUNTER — Ambulatory Visit (INDEPENDENT_AMBULATORY_CARE_PROVIDER_SITE_OTHER): Payer: PRIVATE HEALTH INSURANCE | Admitting: Obstetrics and Gynecology

## 2021-12-11 VITALS — BP 112/74 | HR 67 | Ht 64.0 in

## 2021-12-11 DIAGNOSIS — N950 Postmenopausal bleeding: Secondary | ICD-10-CM | POA: Diagnosis not present

## 2021-12-11 DIAGNOSIS — N951 Menopausal and female climacteric states: Secondary | ICD-10-CM

## 2021-12-11 DIAGNOSIS — N926 Irregular menstruation, unspecified: Secondary | ICD-10-CM

## 2021-12-11 MED ORDER — PROGESTERONE MICRONIZED 100 MG PO CAPS: 100.0000 mg | ORAL_CAPSULE | Freq: Every day | ORAL | 1 refills | Status: DC

## 2021-12-11 NOTE — Progress Notes (Signed)
GYNECOLOGY  VISIT   HPI: 56 y.o.   Married White or Caucasian Not Hispanic or Latino  female   [id number] with Patient's last menstrual period was 04/16/2020.   here for evaluation of PMP bleeding on HRT. She was started on HRT in 9/22 for severe vasomotor symptoms. She started spotting in 3/23  after she missed 3 progesterone doses. She spotted for 1.5 weeks, nothing since. She switched from prometrium to provera, secondary to bowel issues, now doesn't think the prometrium was the problem.   She had a hysteroscopy, polypectomy, D&C in 11/21, pathology was benign.   Pap 04/09/21 negative, negative hpv.  GYNECOLOGIC HISTORY: Patient's last menstrual period was 04/16/2020. 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   Preoperative cardiovascular examination 05/10/2020   Anxiety    GERD (gastroesophageal reflux disease)    History of palpitations    Hyperlipemia    Hypothyroid    PMB (postmenopausal bleeding)    Thyroid disease    Urinary incontinence    Palpitations 10/13/2019   Chest discomfort 10/13/2019   Generalized anxiety disorder 09/21/2018   Controlled substance agreement signed 09/21/2018   Depression 04/16/2016   Hyperlipidemia 04/16/2016   Hypothyroidism 04/16/2016    Past Medical History:  Diagnosis Date   Chest discomfort 10/13/2019   Depression    Generalized anxiety disorder    GERD (gastroesophageal reflux disease)    History of palpitations    MARCH 2021   Hyperlipidemia 04/16/2016   Hypothyroidism    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   Palpitations 10/13/2019   PMB (postmenopausal bleeding)    Preoperative cardiovascular  examination 05/10/2020   Urinary incontinence     Past Surgical History:  Procedure Laterality Date   chin and lip surgery  2001   COLONSCOPY  2018   5 POLYPS REMOVED   DILATATION & CURETTAGE/HYSTEROSCOPY WITH MYOSURE N/A 06/18/2020   Procedure: DILATATION & CURETTAGE/HYSTEROSCOPY WITH MYOSURE;  Surgeon: Romualdo Bolk, MD;  Location: [facility] ;  Service: Gynecology;  Laterality: N/A;   labial repair  2000    Current Outpatient Medications  Medication Sig Dispense Refill   atorvastatin (LIPITOR) 40 MG tablet Take 1 tablet (40 mg total) by mouth daily. 30 tablet 12   DULoxetine (CYMBALTA) 60 MG capsule Take 1 capsule (60 mg total) by mouth daily. 30 capsule 12   estradiol ([name]-DOT) 0.0375 MG/24HR Place 1 patch onto the skin 2 (two) times a week. 24 patch 3   levothyroxine (SYNTHROID) 25 MCG tablet TAKE ONE TABLET BY MOUTH DAILY BEFORE BREAKFAST 90 tablet 1   medroxyPROGESTERone (PROVERA) 2.5 MG tablet Take 1 tablet (2.5 mg total) by mouth daily. 90 tablet 1   metFORMIN (GLUCOPHAGE XR) 500 MG 24 hr tablet Take 1 tablet (500 mg total) by mouth daily with breakfast. 90 tablet 3   metoprolol succinate (TOPROL-XL) 25 MG 24 hr tablet TAKE ONE  TABLET BY MOUTH DAILY 30 tablet 0   omeprazole (PRILOSEC) 20 MG capsule Take 1 capsule (20 mg total) by mouth daily. (NEEDS TO BE SEEN BEFORE NEXT REFILL) 30 capsule 12   valACYclovir (VALTREX) 500 MG tablet Take one tablet a day, increase to one tablet BID x 3 days with an outbreak 90 tablet 3   No current facility-administered medications for this visit.     ALLERGIES: Sulfa antibiotics, Wellbutrin [bupropion], and Pantoprazole  Family History  Problem Relation Age of Onset   Heart attack Mother    Esophageal cancer Mother    Heart attack Father    Hyperlipidemia Sister    Thyroid disease Sister    Hyperlipidemia Sister    Hyperlipidemia Sister    Hyperlipidemia Sister     Social History   Socioeconomic History    Marital status: Married    Spouse name: Not on file   Number of children: Not on file   Years of education: Not on file   Highest education level: Not on file  Occupational History   Occupation: CMA  Tobacco Use   Smoking status: Never   Smokeless tobacco: Never  Vaping Use   Vaping Use: Never used  Substance and Sexual Activity   Alcohol use: Yes    Alcohol/week: 0.0 standard drinks    Comment: RARE   Drug use: Yes    Comment: cannabis pills or gummies rarely LAST USED  2020 PER PT   Sexual activity: Yes    Partners: Male    Birth control/protection: None  Other Topics Concern   Not on file  Social History Narrative   Not on file   Social Determinants of Health   Financial Resource Strain: Not on file  Food Insecurity: Not on file  Transportation Needs: Not on file  Physical Activity: Not on file  Stress: Not on file  Social Connections: Not on file  Intimate Partner Violence: Not on file    ROS  PHYSICAL EXAMINATION:    LMP 04/16/2020     General appearance: alert, cooperative and appears stated age   Pelvic: External genitalia:  no lesions              Urethra:  normal appearing urethra with no masses, tenderness or lesions              Bartholins and Skenes: normal                 Vagina: normal appearing vagina with normal color and discharge, no lesions              Cervix: no lesions and not friable              Bimanual Exam:  Uterus:  normal size, contour, position, consistency, mobility, non-tender and retroverted              Adnexa: no mass, fullness, tenderness               Chaperone was present for exam.  Pelvic ultrasound  Indications: PMP spotting  Findings:  Uterus 8.41 x 5.30 x 3.66 cm, retroverted  Endometrium 1.66 mm, thin, symmetrical On 3D imaging there is a ? Focus, measuring 0.6 x 0.5 cm  Left ovary 2.39 x 1.44 x 1.07 cm  Right ovary 2.49 x 1.30 x 1.35 cm  Small amount of free fluid  Impression:  Normal sized,  retroverted uterus Overall thin, symmetrical endometrium On 3D imaging possible endometrial focus vs artifact Normal ovaries  bilaterally  1. Postmenopausal bleeding She has spotting on HRT after missing a few doses of provera. I suspect that was the cause of the spotting. She has a normal exam, UTD pap, ultrasound with thin endometrium. I suspect possible focus on 3D imaging is an artifact -She will call with any further bleeding. She has a h/o severe cervical stenosis, if she needs has further bleeding she will need a hysteroscopy under ultrasound guidance  2. Vasomotor symptoms due to menopause Doing well on the estrogen patch.  Previously changed from prometrium to provera secondary to GI issues. She no longer thinks the GI issues were from the prometrium. The risks with prometrium are less than with provera. Will change back to prometrium - progesterone (PROMETRIUM) 100 MG capsule; Take 1 capsule (100 mg total) by mouth daily.  Dispense: 90 capsule; Refill: 1

## 2022-03-09 ENCOUNTER — Encounter: Payer: Self-pay | Admitting: Family Medicine

## 2022-03-16 ENCOUNTER — Other Ambulatory Visit: Payer: Self-pay | Admitting: Obstetrics and Gynecology

## 2022-03-23 ENCOUNTER — Other Ambulatory Visit: Payer: Self-pay | Admitting: Obstetrics and Gynecology

## 2022-04-01 ENCOUNTER — Other Ambulatory Visit: Payer: Self-pay | Admitting: Obstetrics and Gynecology

## 2022-04-07 ENCOUNTER — Other Ambulatory Visit: Payer: Self-pay | Admitting: Obstetrics and Gynecology

## 2022-04-08 NOTE — Telephone Encounter (Signed)
Spoke with patient, advised per Dr. Oscar La.  Patient declines to schedule AEX at this time.  Patient aware will need updated AEX for future refills.  Patient verbalizes understanding.   Encounter closed.

## 2022-04-08 NOTE — Telephone Encounter (Signed)
Script sent. Please ask her to schedule an annual exam.

## 2022-04-08 NOTE — Telephone Encounter (Signed)
Refill request received for Provera 2.5 mg tab.  Per review of last OV 12/11/21, patient was switching back to Prometrium.  Spoke with patient, states she continued Provera 2.5 mg tab along with estradiol patch, no problems, would like to continue. Has ran out of medication, is going out of town this Friday. Advised I will forward request to Dr. Oscar La and f/u if any additional recommendations.   AEX 04/09/21 Next AEX not scheduled MMG 08/28/21 neg    Dr. Oscar La -please review.

## 2022-04-17 ENCOUNTER — Other Ambulatory Visit: Payer: Self-pay | Admitting: Family Medicine

## 2022-04-20 LAB — SARS CORONAVIRUS 2 (TAT 6-24 HRS): SARS Coronavirus 2: NEGATIVE

## 2022-05-09 ENCOUNTER — Other Ambulatory Visit: Payer: Self-pay | Admitting: Obstetrics and Gynecology

## 2022-05-09 DIAGNOSIS — N951 Menopausal and female climacteric states: Secondary | ICD-10-CM

## 2022-05-11 NOTE — Telephone Encounter (Signed)
Med refill request: [name] Dot 0.0375 mg Last AEX: 04/09/21/ JJ Next AEX: Not scheduled Last MMG (if hormonal med) 08/28/21, neg Refill authorized: Please Advise?  Rx pended for #8/0RF With message to pharmacy needs updated AEX for future refills.   Routing to covering provider, Dr. Quincy Simmonds.

## 2022-06-21 ENCOUNTER — Other Ambulatory Visit: Payer: Self-pay | Admitting: Obstetrics and Gynecology

## 2022-06-22 NOTE — Telephone Encounter (Signed)
Medication refill request: provera Last AEX:  04/09/21 Next AEX: nothing scheduled  Last MMG (if hormonal medication request): 08/28/21 Bi-rads 1 neg  Refill authorized: #30 with 0 RF pended for today.  Note sent to pharmacy that pt needs to schedule AEX.

## 2022-08-02 ENCOUNTER — Other Ambulatory Visit: Payer: Self-pay | Admitting: Obstetrics and Gynecology

## 2022-08-02 DIAGNOSIS — B009 Herpesviral infection, unspecified: Secondary | ICD-10-CM

## 2022-08-03 NOTE — Telephone Encounter (Signed)
Med refill request: valacyclovir 500 mg tab. Take one tablet a day, increase to one tablet BID x 3 days with an outbreak.   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 Advised refill request sent to Dr. Talbert Nan to review, will need updated AEX for future refills. Dr. Talbert Nan is out of the office today, returning 1/9. If request is urgent, please return call to office at [phone], OPT 4 to notify.   Rx pended #30/0RF   Refill authorized: Please Advise?

## 2022-09-15 ENCOUNTER — Encounter: Payer: Self-pay | Admitting: Family Medicine

## 2022-09-18 NOTE — Progress Notes (Unsigned)
57 y.o. [id number] Married White or Caucasian Not Hispanic or Latino female here for annual exam. She ran out of her HRT about 3 months ago. She is having some hot flashes, dry skin, She is having ~3 hot flashes a day, some sleep issues and occasional night sweats.  Occasionally sexually active, some dryness, lubrication helps.  She wants to restart HRT, aware of the risks.    She has a known right bartholin's cyst, present for years.     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  No bowel or bladder changes. She has been doing kegels and GSI has improved.   She has a rectocele, notices it every couple of days when she goes to the bathroom. Overall tolerable.   Patient's last menstrual period was 04/16/2020.          Sexually active: Yes.    The current method of family planning is post menopausal status.    Exercising: No.  The patient does not participate in regular exercise at present. Smoker:  no  Health Maintenance: Pap:  04/09/21 WNL Hr HPV Neg, 01-30-16 normal HPV HR neg  History of abnormal Pap:  no MMG:  09/03/22 Bi-rads 1 neg  BMD:   n/a Colonoscopy: 2019, benign polyp, f/u 10 years  TDaP:  2019 Gardasil: n/a   reports that she has never smoked. She has never used smokeless tobacco. She reports current alcohol use. She reports current drug use. Very rare ETOH. CMA with primary care MD. Daughter is local, getting her MBA.   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 30 tablet 12   DULoxetine (CYMBALTA) 60 MG capsule Take 1 capsule (60 mg total) by mouth daily. 30 capsule 12   estradiol ([name]-DOT) 0.0375 MG/24HR Place 1 patch onto the skin 2 (two) times a week. 8 patch 0   levothyroxine (SYNTHROID) 25 MCG tablet TAKE ONE TABLET BY MOUTH DAILY BEFORE BREAKFAST 90 tablet 1   medroxyPROGESTERone (PROVERA) 2.5 MG tablet TAKE 1 TABLET BY MOUTH DAILY 30 tablet 0   metFORMIN (GLUCOPHAGE XR) 500 MG 24 hr tablet Take 1 tablet (500 mg total) by mouth daily with breakfast. 90 tablet 3   metoprolol succinate (TOPROL-XL) 25 MG 24 hr tablet TAKE ONE TABLET BY MOUTH DAILY 30 tablet 0   omeprazole (PRILOSEC) 20 MG capsule Take 1 capsule (20 mg total) by mouth daily. (NEEDS TO BE SEEN BEFORE NEXT REFILL) 30 capsule 12   valACYclovir (VALTREX) 500 MG tablet TAKE ONE TABLET BY MOUTH ONCE A DAY, INCREASE TO  ONE TABLET TWO TIMES A DAY FOR 3 DAYS WITH AN OUTBREAK 90 tablet 0   No current facility-administered medications for this visit.    Family History  Problem Relation Age of Onset   Heart attack Mother    Esophageal cancer Mother    Heart attack Father    Hyperlipidemia Sister    Thyroid disease Sister    Hyperlipidemia Sister    Hyperlipidemia Sister    Hyperlipidemia Sister     Review of Systems  All other systems reviewed and are negative.   Exam:   BP 132/82   Pulse (!) 104   LMP 04/16/2020   SpO2 99%   Weight change: '@WEIGHTCHANGE'$ @ Height:     Declines weight. Ht Readings from Last 3 Encounters:  12/11/21 '5\' 4"'$  (1.626 m)  08/19/21  '5\' 4"'$  (1.626 m)  04/09/21 '5\' 4"'$  (1.626 m)    General appearance: alert, cooperative and appears stated age Head: Normocephalic, without obvious abnormality, atraumatic Neck: no adenopathy, supple, symmetrical, trachea midline and thyroid normal to inspection and palpation Lungs: clear to auscultation bilaterally Cardiovascular: regular rate and rhythm Breasts: normal appearance, no masses or tenderness Abdomen: soft, non-tender; non distended,  no masses,  no organomegaly Extremities: extremities normal, atraumatic, no cyanosis or edema Skin: Skin color, texture, turgor normal. No rashes or lesions Lymph nodes: Cervical, supraclavicular, and axillary nodes normal. No abnormal inguinal nodes palpated Neurologic: Grossly normal   Pelvic: External genitalia:  no lesions              Urethra:  normal appearing urethra with no masses, tenderness or lesions              Bartholins and Skenes: normal                 Vagina: normal appearing vagina with normal color and discharge, no lesions              Cervix: no lesions               Bimanual Exam:  Uterus:  normal size, contour, position, consistency, mobility, non-tender and retroverted              Adnexa: no mass, fullness, tenderness               Rectovaginal: Confirms               Anus:  normal sphincter tone, no lesions  Gae Dry, CMA chaperoned for the exam.  1. Well woman exam Discussed breast self exam Discussed calcium and vit D intake Mammogram UTD She will check when her next colonoscopy is due Labs with primary   2. Vasomotor symptoms due to menopause Wants to restart HRT - medroxyPROGESTERone (PROVERA) 2.5 MG tablet; Take 1 tablet (2.5 mg total) by mouth daily.  Dispense: 90 tablet; Refill: 3 - estradiol ([name]-DOT) 0.025 MG/24HR; Place 1 patch onto the skin 2 (two) times a week.  Dispense: 24 patch; Refill: 3  3. Counseling for hormone replacement therapy Discussed risks, including: blood clot, stroke, MI  and breast cancer - medroxyPROGESTERone (PROVERA) 2.5 MG tablet; Take 1 tablet (2.5 mg total) by mouth daily.  Dispense: 90 tablet; Refill: 3 - estradiol ([name]-DOT) 0.025 MG/24HR; Place 1 patch onto the skin 2 (two) times a week.  Dispense: 24 patch; Refill: 3  4. Rectocele Tolerable Avoid heavy lifting and straining  5. HSV infection - valACYclovir (VALTREX) 500 MG tablet; TAKE ONE TABLET BY MOUTH ONCE A DAY, INCREASE TO  ONE TABLET TWO TIMES A DAY FOR 3 DAYS WITH AN OUTBREAK  Dispense: 90 tablet; Refill: 4

## 2022-09-24 ENCOUNTER — Encounter: Payer: Self-pay | Admitting: Obstetrics and Gynecology

## 2022-09-24 ENCOUNTER — Ambulatory Visit: Payer: Medicaid Other | Admitting: Obstetrics and Gynecology

## 2022-09-24 VITALS — BP 132/82 | HR 104

## 2022-09-24 DIAGNOSIS — B009 Herpesviral infection, unspecified: Secondary | ICD-10-CM

## 2022-09-24 DIAGNOSIS — E669 Obesity, unspecified: Secondary | ICD-10-CM | POA: Insufficient documentation

## 2022-09-24 DIAGNOSIS — N951 Menopausal and female climacteric states: Secondary | ICD-10-CM

## 2022-09-24 DIAGNOSIS — Z7189 Other specified counseling: Secondary | ICD-10-CM

## 2022-09-24 DIAGNOSIS — N816 Rectocele: Secondary | ICD-10-CM | POA: Diagnosis not present

## 2022-09-24 DIAGNOSIS — Z01419 Encounter for gynecological examination (general) (routine) without abnormal findings: Secondary | ICD-10-CM | POA: Diagnosis not present

## 2022-09-24 DIAGNOSIS — R195 Other fecal abnormalities: Secondary | ICD-10-CM | POA: Insufficient documentation

## 2022-09-24 MED ORDER — MEDROXYPROGESTERONE ACETATE 2.5 MG PO TABS: 2.5000 mg | ORAL_TABLET | Freq: Every day | ORAL | 3 refills | Status: DC

## 2022-09-24 MED ORDER — VALACYCLOVIR HCL 500 MG PO TABS: ORAL_TABLET | ORAL | 4 refills | Status: DC

## 2022-09-24 MED ORDER — ESTRADIOL 0.025 MG/24HR TD PTTW: 1.0000 | MEDICATED_PATCH | TRANSDERMAL | 3 refills | Status: DC

## 2022-09-24 NOTE — Patient Instructions (Signed)

## 2022-10-20 ENCOUNTER — Encounter: Payer: Self-pay | Admitting: Obstetrics and Gynecology

## 2022-10-28 ENCOUNTER — Encounter: Payer: Self-pay | Admitting: Family Medicine

## 2022-11-02 ENCOUNTER — Encounter: Payer: Self-pay | Admitting: Podiatry

## 2022-11-02 ENCOUNTER — Ambulatory Visit (INDEPENDENT_AMBULATORY_CARE_PROVIDER_SITE_OTHER): Payer: 59 | Admitting: Podiatry

## 2022-11-02 ENCOUNTER — Ambulatory Visit (INDEPENDENT_AMBULATORY_CARE_PROVIDER_SITE_OTHER): Payer: 59

## 2022-11-02 VITALS — Ht 64.0 in | Wt 199.0 lb

## 2022-11-02 DIAGNOSIS — M778 Other enthesopathies, not elsewhere classified: Secondary | ICD-10-CM | POA: Diagnosis not present

## 2022-11-02 DIAGNOSIS — M7752 Other enthesopathy of left foot: Secondary | ICD-10-CM | POA: Diagnosis not present

## 2022-11-02 MED ORDER — TRIAMCINOLONE ACETONIDE 10 MG/ML IJ SUSP
10.0000 mg | Freq: Once | INTRAMUSCULAR | Status: AC
  Administered 2022-11-02: 10 mg

## 2022-11-04 NOTE — Progress Notes (Signed)
Subjective:   Patient ID: Chelsea Gross, female   DOB: 57 y.o.   MRN: [id number]   HPI Patient presents stating inflammation underneath the left foot that has been sore and making it hard to walk with possible history of stepping on a nail which could be contributory with no infection that occurred with that 3 months ago   Review of Systems  All other systems reviewed and are negative.       Objective:  Physical Exam Vitals and nursing note reviewed.  Constitutional:      Appearance: She is well-developed.  Pulmonary:     Effort: Pulmonary effort is normal.  Musculoskeletal:        General: Normal range of motion.  Skin:    General: Skin is warm.  Neurological:     Mental Status: She is alert.     Neurovascular status intact muscle strength adequate range of motion adequate inflammation around the subfirst metatarsal left with slight fluid buildup with no indications currently of pathology from stepping on a nail no area that I could note of penetration redness or other issue     Assessment:  Appears to be more of a capsular inflammation plantar left first metatarsal versus a foreign body or other pathology     Plan:  H&P x-ray reviewed and went ahead today did sterile prep and injected the plantar capsule 3 mg Dexasone Kenalog 5 mg Xylocaine and advised on reduced activity I dispensed dancers pads.  Reappoint if symptoms recur  X-rays indicate that there is no signs of foreign body calcification or other pathology secondary to injury

## 2022-11-08 ENCOUNTER — Other Ambulatory Visit: Payer: Self-pay | Admitting: Family Medicine

## 2022-11-11 MED ORDER — ESTRADIOL 0.25 MG/0.25GM TD GEL: 1.0000 | Freq: Every day | TRANSDERMAL | 3 refills | Status: DC

## 2022-11-11 NOTE — Telephone Encounter (Signed)
Dr. Oscar La -please review Rx request.

## 2022-11-12 ENCOUNTER — Other Ambulatory Visit: Payer: Self-pay

## 2022-11-12 NOTE — Telephone Encounter (Signed)
Prior Autherization done for  Estradiol 0.25mg  gel. PA was approved and pharmacy was notified.  Cover My meds Key: [id number]

## 2022-12-02 ENCOUNTER — Encounter: Payer: Self-pay | Admitting: Family Medicine

## 2022-12-02 ENCOUNTER — Ambulatory Visit (INDEPENDENT_AMBULATORY_CARE_PROVIDER_SITE_OTHER): Payer: 59 | Admitting: Family Medicine

## 2022-12-02 VITALS — BP 132/84 | HR 73 | Temp 98.3°F | Ht 64.0 in | Wt 202.0 lb

## 2022-12-02 DIAGNOSIS — F411 Generalized anxiety disorder: Secondary | ICD-10-CM

## 2022-12-02 DIAGNOSIS — Z0001 Encounter for general adult medical examination with abnormal findings: Secondary | ICD-10-CM

## 2022-12-02 DIAGNOSIS — E782 Mixed hyperlipidemia: Secondary | ICD-10-CM | POA: Diagnosis not present

## 2022-12-02 DIAGNOSIS — Z0000 Encounter for general adult medical examination without abnormal findings: Secondary | ICD-10-CM

## 2022-12-02 DIAGNOSIS — R21 Rash and other nonspecific skin eruption: Secondary | ICD-10-CM

## 2022-12-02 DIAGNOSIS — R7303 Prediabetes: Secondary | ICD-10-CM | POA: Diagnosis not present

## 2022-12-02 DIAGNOSIS — E034 Atrophy of thyroid (acquired): Secondary | ICD-10-CM

## 2022-12-02 DIAGNOSIS — E669 Obesity, unspecified: Secondary | ICD-10-CM | POA: Diagnosis not present

## 2022-12-02 DIAGNOSIS — L709 Acne, unspecified: Secondary | ICD-10-CM | POA: Diagnosis not present

## 2022-12-02 DIAGNOSIS — F320 Major depressive disorder, single episode, mild: Secondary | ICD-10-CM

## 2022-12-02 DIAGNOSIS — Z6834 Body mass index (BMI) 34.0-34.9, adult: Secondary | ICD-10-CM | POA: Diagnosis not present

## 2022-12-02 LAB — BAYER DCA HB A1C WAIVED: HB A1C (BAYER DCA - WAIVED): 5.9 % — ABNORMAL HIGH (ref 4.8–5.6)

## 2022-12-02 MED ORDER — CLOTRIMAZOLE-BETAMETHASONE 1-0.05 % EX CREA
1.0000 | TOPICAL_CREAM | Freq: Every day | CUTANEOUS | 0 refills | Status: DC
Start: 2022-12-02 — End: 2023-08-23

## 2022-12-02 MED ORDER — DULOXETINE HCL 60 MG PO CPEP: 60.0000 mg | ORAL_CAPSULE | Freq: Every day | ORAL | 3 refills | Status: DC

## 2022-12-02 MED ORDER — METFORMIN HCL ER 500 MG PO TB24: 500.0000 mg | ORAL_TABLET | Freq: Every day | ORAL | 3 refills | Status: DC

## 2022-12-02 MED ORDER — ATORVASTATIN CALCIUM 40 MG PO TABS: 40.0000 mg | ORAL_TABLET | Freq: Every day | ORAL | 3 refills | Status: DC

## 2022-12-02 MED ORDER — TRETINOIN 0.05 % EX CREA
TOPICAL_CREAM | Freq: Every day | CUTANEOUS | 3 refills | Status: DC
Start: 2022-12-02 — End: 2023-08-23

## 2022-12-02 MED ORDER — OMEPRAZOLE 20 MG PO CPDR: 20.0000 mg | DELAYED_RELEASE_CAPSULE | Freq: Every day | ORAL | 3 refills | Status: DC

## 2022-12-02 NOTE — Patient Instructions (Signed)
Preventive Care 40-58 Years Old, Female Preventive care refers to lifestyle choices and visits with your health care provider that can promote health and wellness. Preventive care visits are also called wellness exams. What can I expect for my preventive care visit? Counseling Your health care provider may ask you questions about your: Medical history, including: Past medical problems. Family medical history. Pregnancy history. Current health, including: Menstrual cycle. Method of birth control. Emotional well-being. Home life and relationship well-being. Sexual activity and sexual health. Lifestyle, including: Alcohol, nicotine or tobacco, and drug use. Access to firearms. Diet, exercise, and sleep habits. Work and work environment. Sunscreen use. Safety issues such as seatbelt and bike helmet use. Physical exam Your health care provider will check your: Height and weight. These may be used to calculate your BMI (body mass index). BMI is a measurement that tells if you are at a healthy weight. Waist circumference. This measures the distance around your waistline. This measurement also tells if you are at a healthy weight and may help predict your risk of certain diseases, such as type 2 diabetes and high blood pressure. Heart rate and blood pressure. Body temperature. Skin for abnormal spots. What immunizations do I need?  Vaccines are usually given at various ages, according to a schedule. Your health care provider will recommend vaccines for you based on your age, medical history, and lifestyle or other factors, such as travel or where you work. What tests do I need? Screening Your health care provider may recommend screening tests for certain conditions. This may include: Lipid and cholesterol levels. Diabetes screening. This is done by checking your blood sugar (glucose) after you have not eaten for a while (fasting). Pelvic exam and Pap test. Hepatitis B test. Hepatitis C  test. HIV (human immunodeficiency virus) test. STI (sexually transmitted infection) testing, if you are at risk. Lung cancer screening. Colorectal cancer screening. Mammogram. Talk with your health care provider about when you should start having regular mammograms. This may depend on whether you have a family history of breast cancer. BRCA-related cancer screening. This may be done if you have a family history of breast, ovarian, tubal, or peritoneal cancers. Bone density scan. This is done to screen for osteoporosis. Talk with your health care provider about your test results, treatment options, and if necessary, the need for more tests. Follow these instructions at home: Eating and drinking  Eat a diet that includes fresh fruits and vegetables, whole grains, lean protein, and low-fat dairy products. Take vitamin and mineral supplements as recommended by your health care provider. Do not drink alcohol if: Your health care provider tells you not to drink. You are pregnant, may be pregnant, or are planning to become pregnant. If you drink alcohol: Limit how much you have to 0-1 drink a day. Know how much alcohol is in your drink. In the [place] one drink equals one 12 oz bottle of beer (355 mL), one 5 oz glass of wine (148 mL), or one 1 oz glass of hard liquor (44 mL). Lifestyle Brush your teeth every morning and night with fluoride toothpaste. Floss one time each day. Exercise for at least 30 minutes 5 or more days each week. Do not use any products that contain nicotine or tobacco. These products include cigarettes, chewing tobacco, and vaping devices, such as e-cigarettes. If you need help quitting, ask your health care provider. Do not use drugs. If you are sexually active, practice safe sex. Use a condom or other form of protection to   prevent STIs. If you do not wish to become pregnant, use a form of birth control. If you plan to become pregnant, see your health care provider for a  prepregnancy visit. Take aspirin only as told by your health care provider. Make sure that you understand how much to take and what form to take. Work with your health care provider to find out whether it is safe and beneficial for you to take aspirin daily. Find healthy ways to manage stress, such as: Meditation, yoga, or listening to music. Journaling. Talking to a trusted person. Spending time with friends and family. Minimize exposure to UV radiation to reduce your risk of skin cancer. Safety Always wear your seat belt while driving or riding in a vehicle. Do not drive: If you have been drinking alcohol. Do not ride with someone who has been drinking. When you are tired or distracted. While texting. If you have been using any mind-altering substances or drugs. Wear a helmet and other protective equipment during sports activities. If you have firearms in your house, make sure you follow all gun safety procedures. Seek help if you have been physically or sexually abused. What's next? Visit your health care provider once a year for an annual wellness visit. Ask your health care provider how often you should have your eyes and teeth checked. Stay up to date on all vaccines. This information is not intended to replace advice given to you by your health care provider. Make sure you discuss any questions you have with your health care provider. Document Revised: 01/08/2021 Document Reviewed: 01/08/2021 Elsevier Patient Education  2023 Elsevier Inc.  

## 2022-12-02 NOTE — Progress Notes (Signed)
Chelsea Gross is a 57 y.o. female presents to office today for annual physical exam examination.    Concerns today include: 1.  Rash Patient reports that she developed a rash on the left elbow a couple of months ago.  Started as a singular lesion but now is a full circular lesion.  No known exposures.  Denies any itching.  2.  Adult acne Patient reports that she previously saw dermatology for this.  She was placed on Retin-A 0.025% at bedtime.  While this has helped she continues to get acneiform breakthrough lesions along the chin.  She also continues to have quite a bit of redness along the cheeks.  3.  Morbid obesity Patient reports that she continues to have issues with obesity.  She admits that she really does not exercise and sugar is her weakness.  She tries to stay away from it but there is a candy jar work that she always finds herself getting into.  She worries about her blood sugar.  She takes metformin.  Her Sister Vanessa Kick was recently diagnosed with type 2 diabetes and 3 of her other sisters already have either diabetes or prediabetes.  She reports excessive daytime fatigue despite adequate hours of sleep.  She admits that her husband has observed her snoring in the past but no known apneic spells.  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 Dr Loreta Ave. Believe it was completed 2018? Last mammogram: UTD Last pap smear: UTD Refills needed today: all Immunizations needed: Immunization History  Administered Date(s) Administered   Hep A / Hep B 01/20/2012, 02/12/2012   Hepatitis A 02/17/2013   Hepatitis A, Ped/Adol-2 Dose 02/17/2013   Influenza Split 04/27/2014, 04/26/2016, 04/29/2017   Influenza-Unspecified 04/27/2014, 04/26/2016, 04/29/2017, 05/11/2019   PFIZER Comirnaty(Gray Top)Covid-19 Tri-Sucrose Vaccine 08/16/2019,  09/04/2019   PFIZER(Purple Top)SARS-COV-2 Vaccination 08/16/2019, 09/04/2019   Td (Adult),5 Lf Tetanus Toxid, Preservative Free 07/28/2004   Tdap 01/30/2016, 12/20/2017   Zoster Recombinat (Shingrix) 11/16/2016, 03/03/2017     Past Medical History:  Diagnosis Date   Chest discomfort 10/13/2019   Depression    Generalized anxiety disorder    GERD (gastroesophageal reflux disease)    History of palpitations    MARCH 2021   Hyperlipidemia 04/16/2016   Hypothyroidism    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   Palpitations 10/13/2019   PMB (postmenopausal bleeding)    Preoperative cardiovascular examination 05/10/2020   Urinary incontinence    Social History   Socioeconomic History   Marital status: Married    Spouse name: Not on file   Number of children: Not on file   Years of education: Not on file   Highest education level: Not on file  Occupational History   Occupation: CMA  Tobacco Use   Smoking status: Never   Smokeless tobacco: Never  Vaping Use   Vaping Use: Never used  Substance and Sexual Activity   Alcohol use: Yes    Alcohol/week: 0.0 standard drinks of alcohol    Comment: RARE   Drug use: Yes    Comment: cannabis pills or gummies rarely LAST USED  2020 PER PT   Sexual activity: Yes    Partners: Male    Birth control/protection: None  Other Topics Concern   Not on file  Social History Narrative   Works for [employer] in [place]   Social Determinants of  Health   Financial Resource Strain: Not on file  Food Insecurity: Not on file  Transportation Needs: Not on file  Physical Activity: Not on file  Stress: Not on file  Social Connections: Not on file  Intimate Partner Violence: Not on file   Past Surgical History:  Procedure Laterality Date   chin and lip surgery  2001   COLONSCOPY  2018   5 POLYPS REMOVED   DILATATION & CURETTAGE/HYSTEROSCOPY WITH MYOSURE N/A 06/18/2020   Procedure: DILATATION & CURETTAGE/HYSTEROSCOPY WITH MYOSURE;   Surgeon: Romualdo Bolk, MD;  Location: [facility];  Service: Gynecology;  Laterality: N/A;   labial repair  2000   Family History  Problem Relation Age of Onset   Heart attack Mother    Esophageal cancer Mother    Heart attack Father    Hyperlipidemia Sister    Thyroid disease Sister    Diabetes Mellitus II Sister    Diabetes Mellitus II Sister    Hyperlipidemia Sister    Diabetes Mellitus II Sister    Hyperlipidemia Sister    Hyperlipidemia Sister     Current Outpatient Medications:    clotrimazole-betamethasone (LOTRISONE) cream, Apply 1 Application topically daily. X10-14 days to left elbow, Disp: 30 g, Rfl: 0   Estradiol (DIVIGEL) 0.25 MG/0.25GM GEL, Place 1 packet onto the skin daily. Apply to the lower abdomen or upper thigh., Disp: 90 each, Rfl: 3   levothyroxine (SYNTHROID) 25 MCG tablet, Take 1 tablet (25 mcg total) by mouth daily before breakfast. (NEEDS TO BE SEEN BEFORE NEXT REFILL), Disp: 30 tablet, Rfl: 0   medroxyPROGESTERone (PROVERA) 2.5 MG tablet, Take 1 tablet (2.5 mg total) by mouth daily., Disp: 90 tablet, Rfl: 3   metoprolol succinate (TOPROL-XL) 25 MG 24 hr tablet, TAKE ONE TABLET BY MOUTH DAILY, Disp: 30 tablet, Rfl: 0   tretinoin (RETIN-A) 0.05 % cream, Apply topically at bedtime., Disp: 45 g, Rfl: 3   valACYclovir (VALTREX) 500 MG tablet, TAKE ONE TABLET BY MOUTH ONCE A DAY, INCREASE TO ONE TABLET TWO TIMES A DAY FOR 3 DAYS WITH AN OUTBREAK, Disp: 90 tablet, Rfl: 4   atorvastatin (LIPITOR) 40 MG tablet, Take 1 tablet (40 mg total) by mouth daily., Disp: 90 tablet, Rfl: 3   DULoxetine (CYMBALTA) 60 MG capsule, Take 1 capsule (60 mg total) by mouth daily., Disp: 90 capsule, Rfl: 3   metFORMIN (GLUCOPHAGE XR) 500 MG 24 hr tablet, Take 1 tablet (500 mg total) by mouth daily with breakfast., Disp: 90 tablet, Rfl: 3   omeprazole (PRILOSEC) 20 MG capsule, Take 1 capsule (20 mg total) by mouth daily., Disp: 90 capsule, Rfl: 3  Allergies   Allergen Reactions   Sulfa Antibiotics Nausea Only   Wellbutrin [Bupropion]     HEART PAPITATIONS   Pantoprazole Rash     ROS: Review of Systems Pertinent items noted in HPI and remainder of comprehensive ROS otherwise negative.    Physical exam BP 132/84   Pulse 73   Temp 98.3 F (36.8 C)   Ht 5\' 4"  (1.626 m)   Wt 202 lb (91.6 kg)   LMP 04/16/2020   SpO2 93%   BMI 34.67 kg/m  General appearance: alert, cooperative, appears stated age, no distress, and moderately obese Head: Normocephalic, without obvious abnormality, atraumatic Eyes: negative findings: lids and lashes normal, conjunctivae and sclerae normal, corneas clear, and pupils equal, round, reactive to light and accomodation Ears: normal TM's and external ear canals both ears Nose: Nares normal. Septum midline.  Mucosa normal. No drainage or sinus tenderness. Throat: lips, mucosa, and tongue normal; teeth and gums normal Neck: no adenopathy, no carotid bruit, supple, symmetrical, trachea midline, and thyroid not enlarged, symmetric, no tenderness/mass/nodules Back: symmetric, no curvature. ROM normal. No CVA tenderness. Lungs: clear to auscultation bilaterally Heart: regular rate and rhythm, S1, S2 normal, no murmur, click, rub or gallop Abdomen:  Obese but soft, nontender Extremities: extremities normal, atraumatic, no cyanosis or edema Pulses: 2+ and symmetric Skin:  Has an annular lesion noted along the extensor surface of the left elbow.  There is central clearing and mild scaling but no appreciable exudates, bleeding or abrasions.  She does have an open comedone noted along the right side of the chin Lymph nodes: Cervical, supraclavicular, and axillary nodes normal. Neurologic: Grossly normal Psych: Mood stable, speech normal, affect appropriate     12/02/2022   11:29 AM 08/19/2021    2:40 PM 01/16/2021    3:02 PM  Depression screen PHQ 2/9  Decreased Interest 1 1 0  Down, Depressed, Hopeless 0 1 0  PHQ - 2  Score 1 2 0  Altered sleeping 2 1 2   Tired, decreased energy 0 1 2  Change in appetite 0 0 1  Feeling bad or failure about yourself  2 1 2   Trouble concentrating 0 1 1  Moving slowly or fidgety/restless 0 0 0  Suicidal thoughts 0 0 0  PHQ-9 Score 5 6 8   Difficult doing work/chores Not difficult at all Not difficult at all       12/02/2022   11:30 AM 08/19/2021    2:41 PM 01/16/2021    3:03 PM 04/24/2019   11:31 AM  GAD 7 : Generalized Anxiety Score  Nervous, Anxious, on Edge 1 0 2 1  Control/stop worrying 1 1 1 1   Worry too much - different things 1 1 1 1   Trouble relaxing 1 1 1 1   Restless 0 1 1 0  Easily annoyed or irritable 0 1 1 0  Afraid - awful might happen 1 0 1 1  Total GAD 7 Score 5 5 8 5   Anxiety Difficulty Not difficult at all Not difficult at all Somewhat difficult Somewhat difficult    Assessment/ Plan: Delphia Grates here for annual physical exam.   Annual physical exam  Mixed hyperlipidemia - Plan: Lipid Panel, atorvastatin (LIPITOR) 40 MG tablet  Obesity (BMI 30.0-34.9) - Plan: CMP14+EGFR, VITAMIN D 25 Hydroxy (Vit-D Deficiency, Fractures), CBC, Lipid Panel, Bayer DCA Hb A1c Waived  Hypothyroidism due to acquired atrophy of thyroid - Plan: TSH, T4, Free  Pre-diabetes - Plan: Bayer DCA Hb A1c Waived, metFORMIN (GLUCOPHAGE XR) 500 MG 24 hr tablet  Adult acne - Plan: tretinoin (RETIN-A) 0.05 % cream  Current mild episode of major depressive disorder without prior episode (HCC) - Plan: DULoxetine (CYMBALTA) 60 MG capsule  Generalized anxiety disorder - Plan: DULoxetine (CYMBALTA) 60 MG capsule  Rash and nonspecific skin eruption - Plan: clotrimazole-betamethasone (LOTRISONE) cream  Fasting labs collected today.  I have renewed medications.  Need to get release of information form for Dr. Loreta Ave.  She appears to have done her colonoscopy back in 2018 but I do not have a result for this  We talked about diet modification.  Whilst I am amenable to helping her  with pharmacologic interventions, I do think that she should focus on lifestyle modification at least for the next 3 months before pursuing any of these.  She will also need to see if  her insurance covers these types of medications  Check thyroid levels, A1c given prediabetes.  Right knee increased 0.05% in efforts to reduce frequency of breakthrough acneiform lesions  Cymbalta renewed  Lotrisone to the affected area on that left elbow.  If does not resolve, recommend follow-up with dermatology  Counseled on healthy lifestyle choices, including diet (rich in fruits, vegetables and lean meats and low in salt and simple carbohydrates) and exercise (at least 30 minutes of moderate physical activity daily).  Patient to follow up in 1 year for annual exam or sooner if needed.  Tequita Marrs M. Nadine Counts, DO

## 2022-12-03 LAB — CMP14+EGFR
ALT: 26 IU/L (ref 0–32)
AST: 19 IU/L (ref 0–40)
Albumin/Globulin Ratio: 1.8 (ref 1.2–2.2)
Albumin: 4.4 g/dL (ref 3.8–4.9)
Alkaline Phosphatase: 120 IU/L (ref 44–121)
BUN/Creatinine Ratio: 19 (ref 9–23)
BUN: 16 mg/dL (ref 6–24)
Bilirubin Total: 0.7 mg/dL (ref 0.0–1.2)
CO2: 24 mmol/L (ref 20–29)
Calcium: 10.2 mg/dL (ref 8.7–10.2)
Chloride: 101 mmol/L (ref 96–106)
Creatinine, Ser: 0.83 mg/dL (ref 0.57–1.00)
Globulin, Total: 2.4 g/dL (ref 1.5–4.5)
Glucose: 104 mg/dL — ABNORMAL HIGH (ref 70–99)
Potassium: 5.2 mmol/L (ref 3.5–5.2)
Sodium: 139 mmol/L (ref 134–144)
Total Protein: 6.8 g/dL (ref 6.0–8.5)
eGFR: 82 mL/min/{1.73_m2} (ref >59)

## 2022-12-03 LAB — CBC
Hematocrit: 39.6 % (ref 34.0–46.6)
Hemoglobin: 13.1 g/dL (ref 11.1–15.9)
MCH: 30.8 pg (ref 26.6–33.0)
MCHC: 33.1 g/dL (ref 31.5–35.7)
MCV: 93 fL (ref 79–97)
Platelets: 348 10*3/uL (ref 150–450)
RBC: 4.26 x10E6/uL (ref 3.77–5.28)
RDW: 13.7 % (ref 11.7–15.4)
WBC: 6.3 10*3/uL (ref 3.4–10.8)

## 2022-12-03 LAB — LIPID PANEL
Chol/HDL Ratio: 4.9 ratio — ABNORMAL HIGH (ref 0.0–4.4)
Cholesterol, Total: 219 mg/dL — ABNORMAL HIGH (ref 100–199)
HDL: 45 mg/dL (ref >39)
LDL Chol Calc (NIH): 140 mg/dL — ABNORMAL HIGH (ref 0–99)
Triglycerides: 188 mg/dL — ABNORMAL HIGH (ref 0–149)
VLDL Cholesterol Cal: 34 mg/dL (ref 5–40)

## 2022-12-03 LAB — T4, FREE: Free T4: 1.18 ng/dL (ref 0.82–1.77)

## 2022-12-03 LAB — TSH: TSH: 6.59 u[IU]/mL — ABNORMAL HIGH (ref 0.450–4.500)

## 2022-12-03 LAB — VITAMIN D 25 HYDROXY (VIT D DEFICIENCY, FRACTURES): Vit D, 25-Hydroxy: 57.1 ng/mL (ref 30.0–100.0)

## 2022-12-04 ENCOUNTER — Telehealth: Payer: Self-pay

## 2022-12-04 NOTE — Telephone Encounter (Signed)
Chelsea Gross (Key: [id number]) PA Case ID #: [id number] Rx #: [id number] Need Help? Call us at [phone] Status sent iconSent to Plan today Drug Tretinoin 0.05% cream ePA cloud Psychologist, educational Electronic PA Form [phone] NCPDP) Original Claim Info 75 PRIOR AUTH REQ-MD CALL [phone] REQUIRES PRIOR AUTHORIZATION(PHARMACY HELP DESK [phone])

## 2022-12-06 ENCOUNTER — Other Ambulatory Visit: Payer: Self-pay | Admitting: Family Medicine

## 2022-12-07 ENCOUNTER — Other Ambulatory Visit: Payer: Self-pay | Admitting: Family Medicine

## 2022-12-07 DIAGNOSIS — E034 Atrophy of thyroid (acquired): Secondary | ICD-10-CM

## 2022-12-07 MED ORDER — LEVOTHYROXINE SODIUM 25 MCG PO TABS: 37.5000 ug | ORAL_TABLET | Freq: Every day | ORAL | 0 refills | Status: DC

## 2022-12-08 NOTE — Telephone Encounter (Signed)
Outcome Approved on May 11 Your PA request has been approved. Additional information will be provided in the approval communication. (Message 1145) Authorization Expiration Date: 04/06/2023 Drug Tretinoin 0.05% cream ePA cloud Psychologist, educational Electronic PA Form [phone] NCPDP)  Pharmacy informed

## 2022-12-16 ENCOUNTER — Encounter (HOSPITAL_COMMUNITY): Payer: Self-pay

## 2022-12-16 ENCOUNTER — Other Ambulatory Visit: Payer: Self-pay

## 2022-12-16 ENCOUNTER — Inpatient Hospital Stay (HOSPITAL_COMMUNITY)
Admission: EM | Admit: 2022-12-16 | Discharge: 2022-12-25 | DRG: 917 | Disposition: A | Discharge status: Other Acute Inpatient Hospital | Payer: 59 | Attending: Internal Medicine | Admitting: Internal Medicine

## 2022-12-16 ENCOUNTER — Other Ambulatory Visit: Payer: Self-pay | Admitting: Family Medicine

## 2022-12-16 DIAGNOSIS — Z79899 Other long term (current) drug therapy: Secondary | ICD-10-CM

## 2022-12-16 DIAGNOSIS — Z79890 Hormone replacement therapy (postmenopausal): Secondary | ICD-10-CM

## 2022-12-16 DIAGNOSIS — T50901A Poisoning by unspecified drugs, medicaments and biological substances, accidental (unintentional), initial encounter: Secondary | ICD-10-CM | POA: Diagnosis present

## 2022-12-16 DIAGNOSIS — Z9151 Personal history of suicidal behavior: Secondary | ICD-10-CM

## 2022-12-16 DIAGNOSIS — T424X2A Poisoning by benzodiazepines, intentional self-harm, initial encounter: Secondary | ICD-10-CM | POA: Diagnosis not present

## 2022-12-16 DIAGNOSIS — K219 Gastro-esophageal reflux disease without esophagitis: Secondary | ICD-10-CM | POA: Diagnosis present

## 2022-12-16 DIAGNOSIS — E872 Acidosis, unspecified: Secondary | ICD-10-CM | POA: Diagnosis present

## 2022-12-16 DIAGNOSIS — F32A Depression, unspecified: Secondary | ICD-10-CM | POA: Diagnosis present

## 2022-12-16 DIAGNOSIS — X58XXXA Exposure to other specified factors, initial encounter: Secondary | ICD-10-CM | POA: Diagnosis present

## 2022-12-16 DIAGNOSIS — E039 Hypothyroidism, unspecified: Secondary | ICD-10-CM | POA: Diagnosis present

## 2022-12-16 DIAGNOSIS — R404 Transient alteration of awareness: Secondary | ICD-10-CM | POA: Diagnosis not present

## 2022-12-16 DIAGNOSIS — Z818 Family history of other mental and behavioral disorders: Secondary | ICD-10-CM

## 2022-12-16 DIAGNOSIS — G969 Disorder of central nervous system, unspecified: Secondary | ICD-10-CM | POA: Diagnosis not present

## 2022-12-16 DIAGNOSIS — E876 Hypokalemia: Secondary | ICD-10-CM | POA: Diagnosis present

## 2022-12-16 DIAGNOSIS — R451 Restlessness and agitation: Secondary | ICD-10-CM | POA: Diagnosis not present

## 2022-12-16 DIAGNOSIS — Z8249 Family history of ischemic heart disease and other diseases of the circulatory system: Secondary | ICD-10-CM

## 2022-12-16 DIAGNOSIS — R000 Tachycardia, unspecified: Secondary | ICD-10-CM | POA: Diagnosis not present

## 2022-12-16 DIAGNOSIS — E034 Atrophy of thyroid (acquired): Secondary | ICD-10-CM

## 2022-12-16 DIAGNOSIS — F13231 Sedative, hypnotic or anxiolytic dependence with withdrawal delirium: Secondary | ICD-10-CM | POA: Diagnosis not present

## 2022-12-16 DIAGNOSIS — G4700 Insomnia, unspecified: Secondary | ICD-10-CM | POA: Diagnosis present

## 2022-12-16 DIAGNOSIS — Z7984 Long term (current) use of oral hypoglycemic drugs: Secondary | ICD-10-CM

## 2022-12-16 DIAGNOSIS — T50902A Poisoning by unspecified drugs, medicaments and biological substances, intentional self-harm, initial encounter: Principal | ICD-10-CM

## 2022-12-16 DIAGNOSIS — G9341 Metabolic encephalopathy: Secondary | ICD-10-CM | POA: Insufficient documentation

## 2022-12-16 DIAGNOSIS — F411 Generalized anxiety disorder: Secondary | ICD-10-CM | POA: Diagnosis present

## 2022-12-16 DIAGNOSIS — R092 Respiratory arrest: Secondary | ICD-10-CM

## 2022-12-16 DIAGNOSIS — Z781 Physical restraint status: Secondary | ICD-10-CM

## 2022-12-16 DIAGNOSIS — E785 Hyperlipidemia, unspecified: Secondary | ICD-10-CM | POA: Diagnosis present

## 2022-12-16 DIAGNOSIS — Z882 Allergy status to sulfonamides status: Secondary | ICD-10-CM

## 2022-12-16 DIAGNOSIS — Z1152 Encounter for screening for COVID-19: Secondary | ICD-10-CM

## 2022-12-16 DIAGNOSIS — J690 Pneumonitis due to inhalation of food and vomit: Secondary | ICD-10-CM | POA: Diagnosis present

## 2022-12-16 DIAGNOSIS — Z888 Allergy status to other drugs, medicaments and biological substances status: Secondary | ICD-10-CM

## 2022-12-16 HISTORY — DX: Suicide attempt, initial encounter: T14.91XA

## 2022-12-16 LAB — SALICYLATE LEVEL: Salicylate Lvl: <7 mg/dL — ABNORMAL LOW (ref 7.0–30.0)

## 2022-12-16 LAB — COMPREHENSIVE METABOLIC PANEL
ALT: 27 U/L (ref 0–44)
AST: 19 U/L (ref 15–41)
Albumin: 3.6 g/dL (ref 3.5–5.0)
Alkaline Phosphatase: 87 U/L (ref 38–126)
Anion gap: 7 (ref 5–15)
BUN: 11 mg/dL (ref 6–20)
CO2: 26 mmol/L (ref 22–32)
Calcium: 8.9 mg/dL (ref 8.9–10.3)
Chloride: 104 mmol/L (ref 98–111)
Creatinine, Ser: 0.9 mg/dL (ref 0.44–1.00)
GFR, Estimated: >60 mL/min (ref >60)
Glucose, Bld: 103 mg/dL — ABNORMAL HIGH (ref 70–99)
Potassium: 3.6 mmol/L (ref 3.5–5.1)
Sodium: 137 mmol/L (ref 135–145)
Total Bilirubin: 0.9 mg/dL (ref 0.3–1.2)
Total Protein: 6.6 g/dL (ref 6.5–8.1)

## 2022-12-16 LAB — CBC WITH DIFFERENTIAL/PLATELET
Abs Immature Granulocytes: 0.03 10*3/uL (ref 0.00–0.07)
Basophils Absolute: 0.1 10*3/uL (ref 0.0–0.1)
Basophils Relative: 1 %
Eosinophils Absolute: 0.2 10*3/uL (ref 0.0–0.5)
Eosinophils Relative: 2 %
HCT: 38.5 % (ref 36.0–46.0)
Hemoglobin: 12.6 g/dL (ref 12.0–15.0)
Immature Granulocytes: 0 %
Lymphocytes Relative: 33 %
Lymphs Abs: 2.7 10*3/uL (ref 0.7–4.0)
MCH: 31 pg (ref 26.0–34.0)
MCHC: 32.7 g/dL (ref 30.0–36.0)
MCV: 94.8 fL (ref 80.0–100.0)
Monocytes Absolute: 0.6 10*3/uL (ref 0.1–1.0)
Monocytes Relative: 7 %
Neutro Abs: 4.8 10*3/uL (ref 1.7–7.7)
Neutrophils Relative %: 57 %
Platelets: 290 10*3/uL (ref 150–400)
RBC: 4.06 MIL/uL (ref 3.87–5.11)
RDW: 13.6 % (ref 11.5–15.5)
WBC: 8.4 10*3/uL (ref 4.0–10.5)
nRBC: 0 % (ref 0.0–0.2)

## 2022-12-16 LAB — CBG MONITORING, ED: Glucose-Capillary: 95 mg/dL (ref 70–99)

## 2022-12-16 LAB — RAPID URINE DRUG SCREEN, HOSP PERFORMED
Amphetamines: NOT DETECTED
Barbiturates: NOT DETECTED
Benzodiazepines: POSITIVE — AB
Cocaine: NOT DETECTED
Opiates: NOT DETECTED
Tetrahydrocannabinol: NOT DETECTED

## 2022-12-16 LAB — ETHANOL: Alcohol, Ethyl (B): <10 mg/dL (ref <10)

## 2022-12-16 LAB — MRSA NEXT GEN BY PCR, NASAL: MRSA by PCR Next Gen: NOT DETECTED

## 2022-12-16 LAB — ACETAMINOPHEN LEVEL
Acetaminophen (Tylenol), Serum: <10 ug/mL — ABNORMAL LOW (ref 10–30)
Acetaminophen (Tylenol), Serum: <10 ug/mL — ABNORMAL LOW (ref 10–30)

## 2022-12-16 MED ORDER — PANTOPRAZOLE SODIUM 40 MG PO TBEC: 40.0000 mg | DELAYED_RELEASE_TABLET | Freq: Every day | ORAL | Status: DC

## 2022-12-16 MED ORDER — POTASSIUM CHLORIDE 10 MEQ/100ML IV SOLN
10.0000 meq | INTRAVENOUS | Status: AC
  Administered 2022-12-17 (×4): 10 meq via INTRAVENOUS
  Filled 2022-12-16 (×4): qty 100

## 2022-12-16 MED ORDER — SODIUM CHLORIDE 0.9 % IV SOLN: INTRAVENOUS | Status: DC

## 2022-12-16 MED ORDER — LEVOTHYROXINE SODIUM 25 MCG PO TABS
25.0000 ug | ORAL_TABLET | Freq: Every day | ORAL | Status: DC
  Administered 2022-12-23 – 2022-12-25 (×3): 25 ug via ORAL
  Filled 2022-12-16 (×4): qty 1

## 2022-12-16 MED ORDER — CHLORHEXIDINE GLUCONATE CLOTH 2 % EX PADS
6.0000 | MEDICATED_PAD | Freq: Every day | CUTANEOUS | Status: DC
  Administered 2022-12-17 – 2022-12-24 (×8): 6 via TOPICAL

## 2022-12-16 MED ORDER — ACETAMINOPHEN 650 MG RE SUPP: 650.0000 mg | Freq: Four times a day (QID) | RECTAL | Status: DC | PRN

## 2022-12-16 MED ORDER — ONDANSETRON HCL 4 MG PO TABS: 4.0000 mg | ORAL_TABLET | Freq: Four times a day (QID) | ORAL | Status: DC | PRN

## 2022-12-16 MED ORDER — DULOXETINE HCL 60 MG PO CPEP
60.0000 mg | ORAL_CAPSULE | Freq: Every day | ORAL | Status: DC
  Administered 2022-12-23 – 2022-12-25 (×3): 60 mg via ORAL
  Filled 2022-12-16 (×3): qty 1

## 2022-12-16 MED ORDER — HEPARIN SODIUM (PORCINE) 5000 UNIT/ML IJ SOLN
5000.0000 [IU] | Freq: Three times a day (TID) | INTRAMUSCULAR | Status: DC
  Administered 2022-12-16 – 2022-12-25 (×26): 5000 [IU] via SUBCUTANEOUS
  Filled 2022-12-16 (×26): qty 1

## 2022-12-16 MED ORDER — METOPROLOL SUCCINATE ER 25 MG PO TB24: 25.0000 mg | ORAL_TABLET | Freq: Every day | ORAL | Status: DC

## 2022-12-16 MED ORDER — ATORVASTATIN CALCIUM 40 MG PO TABS
40.0000 mg | ORAL_TABLET | Freq: Every day | ORAL | Status: DC
  Administered 2022-12-23 – 2022-12-25 (×3): 40 mg via ORAL
  Filled 2022-12-16 (×3): qty 1

## 2022-12-16 MED ORDER — ACETAMINOPHEN 325 MG PO TABS
650.0000 mg | ORAL_TABLET | Freq: Four times a day (QID) | ORAL | Status: DC | PRN
  Administered 2022-12-23: 650 mg via ORAL
  Filled 2022-12-16: qty 2

## 2022-12-16 MED ORDER — ONDANSETRON HCL 4 MG/2ML IJ SOLN: 4.0000 mg | Freq: Four times a day (QID) | INTRAMUSCULAR | Status: DC | PRN

## 2022-12-16 NOTE — Progress Notes (Signed)
eLink Physician-Brief Progress Note Patient Name: Chelsea Gross DOB: 07-30-1965 MRN: [id number]   Date of Service  12/16/2022  HPI/Events of Note  Patient admitted to the ICU secondary to a Xanax overdose taken in a suicide bid, she took 52 tablets of Xanax, she is somnolent but breathing spontaneously and protecting her airway for now.Marland Kitchen  eICU Interventions  New Patient Evaluation.        Migdalia Dk 12/16/2022, 11:31 PM

## 2022-12-16 NOTE — ED Notes (Signed)
Patient belongings removed from locker in ED and taken to ICU with patient, given to ICU nurse

## 2022-12-16 NOTE — Progress Notes (Signed)
Patient currently asleep with 1:1 monitor in room observing.

## 2022-12-16 NOTE — Progress Notes (Signed)
Discussed case w night team. Agree w admit to ap icu. Ok to wean off vent once wakes up. Will check on in am. Call [facility] with issues in interim.  Myrla Halsted md PCCM

## 2022-12-16 NOTE — ED Triage Notes (Addendum)
Pt bib [facility] for intentional overdose, EMS report pt spouse called d/t pt having AMS after being found outside in recliner and admitting to spouse and EMS that she took 59 xanax as suicide attempt. EMS brought pt husband's xanax bottle that had one pill left in it- 60 tablets prescribed, one pill left, was filled on 12/12/22- EMS reports pt spouse report pt ingested these pills about two hours ago. pt spouse prescribed two pills daily. Pt drowsy, confused, and  responds to verbal stimuli,

## 2022-12-16 NOTE — ED Notes (Signed)
Patient placed on EtCO2 per provider request

## 2022-12-16 NOTE — ED Notes (Signed)
ED TO INPATIENT HANDOFF REPORT  ED Nurse Name and Phone #: Toni Amend [phone]  S Name/Age/Gender Chelsea Gross 57 y.o. female Room/Bed: APA02/APA02  Code Status   Code Status: Full Code  Home/SNF/Other Home  Is this baseline? No   Triage Complete: Triage complete  Chief Complaint Overdose [T50.901A]  Triage Note Pt bib [facility] for intentional overdose, EMS report pt spouse called d/t pt having AMS after being found outside in recliner and admitting to spouse and EMS that she took 105 xanax as suicide attempt. EMS brought pt husband's xanax bottle that had one pill left in it- 60 tablets prescribed, one pill left, was filled on 12/12/22- EMS reports pt spouse report pt ingested these pills about two hours ago. pt spouse prescribed two pills daily. Pt drowsy, confused, and  responds to verbal stimuli,     Allergies Allergies  Allergen Reactions   Sulfa Antibiotics Nausea Only   Wellbutrin [Bupropion]     HEART PAPITATIONS   Pantoprazole Rash    Level of Care/Admitting Diagnosis ED Disposition     ED Disposition  Admit   Condition  --   Comment  Hospital Area: [facility] [[id number]]  Level of Care: Stepdown [14]  Covid Evaluation: Asymptomatic - no recent exposure (last 10 days) testing not required  Diagnosis: Overdose [202577]  Admitting Physician: Lilyan Gilford [[id number]]  Attending Physician: Lilyan Gilford [[id number]]          B Medical/Surgery History Past Medical History:  Diagnosis Date   Chest discomfort 10/13/2019   Depression    Generalized anxiety disorder    GERD (gastroesophageal reflux disease)    History of palpitations    MARCH 2021   Hyperlipidemia 04/16/2016   Hypothyroidism    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   Palpitations 10/13/2019   PMB (postmenopausal bleeding)    Preoperative cardiovascular examination 05/10/2020   Suicidal behavior with attempted self-injury [facility])    Urinary incontinence    Past  Surgical History:  Procedure Laterality Date   chin and lip surgery  2001   COLONSCOPY  2018   5 POLYPS REMOVED   DILATATION & CURETTAGE/HYSTEROSCOPY WITH MYOSURE N/A 06/18/2020   Procedure: DILATATION & CURETTAGE/HYSTEROSCOPY WITH MYOSURE;  Surgeon: Romualdo Bolk, MD;  Location: [facility];  Service: Gynecology;  Laterality: N/A;   labial repair  2000     A IV Location/Drains/Wounds Patient Lines/Drains/Airways Status     Active Line/Drains/Airways     Name Placement date Placement time Site Days   Peripheral IV 12/16/22 20 G 1" Left Antecubital 12/16/22  1836  Antecubital  less than 1   Incision (Closed) 06/18/20 Perineum 06/18/20  0829  -- 911            Intake/Output Last 24 hours No intake or output data in the 24 hours ending 12/16/22 2125  Labs/Imaging Results for orders placed or performed during the hospital encounter of 12/16/22 (from the past 48 hour(s))  CBG monitoring, ED     Status: None   Collection Time: 12/16/22  6:52 PM  Result Value Ref Range   Glucose-Capillary 95 70 - 99 mg/dL    Comment: Glucose reference range applies only to samples taken after fasting for at least 8 hours.  Comprehensive metabolic panel     Status: Abnormal   Collection Time: 12/16/22  6:53 PM  Result Value Ref Range   Sodium 137 135 - 145 mmol/L   Potassium 3.6 3.5 - 5.1 mmol/L   Chloride  104 98 - 111 mmol/L   CO2 26 22 - 32 mmol/L   Glucose, Bld 103 (H) 70 - 99 mg/dL    Comment: Glucose reference range applies only to samples taken after fasting for at least 8 hours.   BUN 11 6 - 20 mg/dL   Creatinine, Ser 1.61 0.44 - 1.00 mg/dL   Calcium 8.9 8.9 - 09.6 mg/dL   Total Protein 6.6 6.5 - 8.1 g/dL   Albumin 3.6 3.5 - 5.0 g/dL   AST 19 15 - 41 U/L   ALT 27 0 - 44 U/L   Alkaline Phosphatase 87 38 - 126 U/L   Total Bilirubin 0.9 0.3 - 1.2 mg/dL   GFR, Estimated >04 >54 mL/min    Comment: (NOTE) Calculated using the CKD-EPI Creatinine Equation (2021)     Anion gap 7 5 - 15    Comment: Performed at [facility], [address], [place], [place] [address]  Salicylate level     Status: Abnormal   Collection Time: 12/16/22  6:53 PM  Result Value Ref Range   Salicylate Lvl <7.0 (L) 7.0 - 30.0 mg/dL    Comment: Performed at [facility], [address] [place], [place] [address]  Acetaminophen level     Status: Abnormal   Collection Time: 12/16/22  6:53 PM  Result Value Ref Range   Acetaminophen (Tylenol), Serum <10 (L) 10 - 30 ug/mL    Comment: (NOTE) Therapeutic concentrations vary significantly. A range of 10-30 ug/mL  may be an effective concentration for many patients. However, some  are best treated at concentrations outside of this range. Acetaminophen concentrations >150 ug/mL at 4 hours after ingestion  and >50 ug/mL at 12 hours after ingestion are often associated with  toxic reactions.  Performed at [facility], [address] [place], [place] [address]   Ethanol     Status: None   Collection Time: 12/16/22  6:53 PM  Result Value Ref Range   Alcohol, Ethyl (B) <10 <10 mg/dL    Comment: (NOTE) Lowest detectable limit for serum alcohol is 10 mg/dL.  For medical purposes only. Performed at [facility], [address], [place], [place] [address]   CBC WITH DIFFERENTIAL     Status: None   Collection Time: 12/16/22  6:53 PM  Result Value Ref Range   WBC 8.4 4.0 - 10.5 K/uL   RBC 4.06 3.87 - 5.11 MIL/uL   Hemoglobin 12.6 12.0 - 15.0 g/dL   HCT 57.8 46.9 - 62.9 %   MCV 94.8 80.0 - 100.0 fL   MCH 31.0 26.0 - 34.0 pg   MCHC 32.7 30.0 - 36.0 g/dL   RDW 52.8 41.3 - 24.4 %   Platelets 290 150 - 400 K/uL   nRBC 0.0 0.0 - 0.2 %   Neutrophils Relative % 57 %   Neutro Abs 4.8 1.7 - 7.7 K/uL   Lymphocytes Relative 33 %   Lymphs Abs 2.7 0.7 - 4.0 K/uL   Monocytes Relative 7 %   Monocytes Absolute 0.6 0.1 - 1.0 K/uL   Eosinophils Relative 2 %   Eosinophils Absolute 0.2 0.0 - 0.5 K/uL   Basophils Relative 1 %   Basophils  Absolute 0.1 0.0 - 0.1 K/uL   Immature Granulocytes 0 %   Abs Immature Granulocytes 0.03 0.00 - 0.07 K/uL    Comment: Performed at [facility], [address], [place], [place] [address]  Acetaminophen level     Status: Abnormal   Collection Time: 12/16/22  8:31 PM  Result Value Ref Range   Acetaminophen (Tylenol), Serum <10 (L) 10 - 30 ug/mL    Comment: (NOTE) Therapeutic concentrations vary significantly. A range of 10-30 ug/mL  may be an effective concentration for many patients. However, some  are best treated at concentrations outside of this range. Acetaminophen concentrations >150 ug/mL at 4 hours after ingestion  and >50 ug/mL at 12 hours after ingestion are often associated with  toxic reactions.  Performed at [facility], [address], [place], [place] [address]    No results found.  Pending Labs Unresulted Labs (From admission, onward)     Start     Ordered   12/16/22 1846  Urine rapid drug screen (hosp performed)  Once,   STAT        12/16/22 1845   Signed and Held  HIV Antibody (routine testing w rflx)  (HIV Antibody (Routine testing w reflex) panel)  Once,   R        Signed and Held   Signed and Held  Comprehensive metabolic panel  Tomorrow morning,   R        Signed and Held   Signed and Held  CBC with Differential/Platelet  Tomorrow morning,   R        Signed and Held   Signed and Held  Magnesium  Tomorrow morning,   R        Signed and Held            Vitals/Pain Today's Vitals   12/16/22 1917 12/16/22 1930 12/16/22 2000 12/16/22 2030  BP:  102/67 102/66 103/64  Pulse:  81 85 76  Resp:  14 16 16   Temp:      TempSrc:      SpO2:  95% 99% 99%  Weight:      Height:      PainSc: Asleep       Isolation Precautions No active isolations  Medications Medications - No data to display  Mobility non-ambulatory     Focused Assessments    R Recommendations: See Admitting Provider Note  Report given to:   Additional Notes: Intermittent  consciousness

## 2022-12-16 NOTE — ED Notes (Signed)
Spoke with poison control. Requests 4 hour tylenol level be drawn at 2030; continue monitoring until mentation improves and breathing status improves

## 2022-12-16 NOTE — ED Provider Notes (Signed)
[place] EMERGENCY DEPARTMENT AT [facility] Provider Note   CSN: [id number] Arrival date & time: 12/16/22  1823     History {Add pertinent medical, surgical, social history, OB history to HPI:1} Chief Complaint  Patient presents with  . Drug Overdose    SI    Chelsea Gross is a 57 y.o. female.  HPI     Home Medications Prior to Admission medications   Medication Sig Start Date End Date Taking? Authorizing Provider  Vitamin D, Ergocalciferol, (DRISDOL) 1.25 MG (50000 UNIT) CAPS capsule Take 50,000 Units by mouth every 7 (seven) days. 11/24/22  Yes [provider]  atorvastatin (LIPITOR) 40 MG tablet Take 1 tablet (40 mg total) by mouth daily. 12/02/22   Raliegh Ip, DO  clotrimazole-betamethasone (LOTRISONE) cream Apply 1 Application topically daily. X10-14 days to left elbow 12/02/22   Delynn Flavin M, DO  DULoxetine (CYMBALTA) 60 MG capsule Take 1 capsule (60 mg total) by mouth daily. 12/02/22   Raliegh Ip, DO  Estradiol (DIVIGEL) 0.25 MG/0.25GM GEL Place 1 packet onto the skin daily. Apply to the lower abdomen or upper thigh. 11/11/22   Romualdo Bolk, MD  levothyroxine (SYNTHROID) 25 MCG tablet TAKE 1 TABLET BY MOUTH DAILY BEFORE BREAKFAST. 12/16/22   Delynn Flavin M, DO  medroxyPROGESTERone (PROVERA) 2.5 MG tablet Take 1 tablet (2.5 mg total) by mouth daily. 09/24/22   Romualdo Bolk, MD  metFORMIN (GLUCOPHAGE XR) 500 MG 24 hr tablet Take 1 tablet (500 mg total) by mouth daily with breakfast. 12/02/22   Delynn Flavin M, DO  metoprolol succinate (TOPROL-XL) 25 MG 24 hr tablet TAKE ONE TABLET BY MOUTH DAILY 06/23/21   Revankar, Aundra Dubin, MD  omeprazole (PRILOSEC) 20 MG capsule Take 1 capsule (20 mg total) by mouth daily. 12/02/22   Raliegh Ip, DO  tretinoin (RETIN-A) 0.05 % cream Apply topically at bedtime. 12/02/22   Raliegh Ip, DO  valACYclovir (VALTREX) 500 MG tablet TAKE ONE TABLET BY MOUTH ONCE A DAY,  INCREASE TO ONE TABLET TWO TIMES A DAY FOR 3 DAYS WITH AN OUTBREAK 09/24/22   Romualdo Bolk, MD      Allergies    Sulfa antibiotics, Wellbutrin [bupropion], and Pantoprazole    Review of Systems   Review of Systems  Physical Exam Updated Vital Signs BP 102/66 (BP Location: Left Arm)   Pulse 85   Temp (!) 97.5 F (36.4 C) (Oral)   Resp 16   Ht 5\' 4"  (1.626 m)   Wt 91.6 kg   LMP 04/16/2020   SpO2 99%   BMI 34.66 kg/m  Physical Exam  ED Results / Procedures / Treatments   Labs (all labs ordered are listed, but only abnormal results are displayed) Labs Reviewed  COMPREHENSIVE METABOLIC PANEL - Abnormal; Notable for the following components:      Result Value   Glucose, Bld 103 (*)    All other components within normal limits  SALICYLATE LEVEL - Abnormal; Notable for the following components:   Salicylate Lvl <7.0 (*)    All other components within normal limits  ACETAMINOPHEN LEVEL - Abnormal; Notable for the following components:   Acetaminophen (Tylenol), Serum <10 (*)    All other components within normal limits  ETHANOL  CBC WITH DIFFERENTIAL/PLATELET  RAPID URINE DRUG SCREEN, HOSP PERFORMED  ACETAMINOPHEN LEVEL  CBG MONITORING, ED    EKG None  Radiology No results found.  Procedures Procedures  {Document cardiac monitor, telemetry assessment procedure  when appropriate:1}  Medications Ordered in ED Medications - No data to display  ED Course/ Medical Decision Making/ A&P   {   Click here for ABCD2, HEART and other calculatorsREFRESH Note before signing :1}                          Medical Decision Making Amount and/or Complexity of Data Reviewed Labs: ordered.  Risk Decision regarding hospitalization.   ***  {Document critical care time when appropriate:1} {Document review of labs and clinical decision tools ie heart score, Chads2Vasc2 etc:1}  {Document your independent review of radiology images, and any outside records:1} {Document  your discussion with family members, caretakers, and with consultants:1} {Document social determinants of health affecting pt's care:1} {Document your decision making why or why not admission, treatments were needed:1} Final Clinical Impression(s) / ED Diagnoses Final diagnoses:  Intentional overdose, initial encounter [facility])    Rx / DC Orders ED Discharge Orders     None

## 2022-12-17 DIAGNOSIS — G4700 Insomnia, unspecified: Secondary | ICD-10-CM | POA: Diagnosis not present

## 2022-12-17 DIAGNOSIS — E039 Hypothyroidism, unspecified: Secondary | ICD-10-CM | POA: Diagnosis not present

## 2022-12-17 DIAGNOSIS — F411 Generalized anxiety disorder: Secondary | ICD-10-CM

## 2022-12-17 DIAGNOSIS — E876 Hypokalemia: Secondary | ICD-10-CM | POA: Diagnosis not present

## 2022-12-17 DIAGNOSIS — Z1152 Encounter for screening for COVID-19: Secondary | ICD-10-CM | POA: Diagnosis not present

## 2022-12-17 DIAGNOSIS — Z8249 Family history of ischemic heart disease and other diseases of the circulatory system: Secondary | ICD-10-CM | POA: Diagnosis not present

## 2022-12-17 DIAGNOSIS — T50902A Poisoning by unspecified drugs, medicaments and biological substances, intentional self-harm, initial encounter: Secondary | ICD-10-CM | POA: Diagnosis not present

## 2022-12-17 DIAGNOSIS — R092 Respiratory arrest: Secondary | ICD-10-CM

## 2022-12-17 DIAGNOSIS — R569 Unspecified convulsions: Secondary | ICD-10-CM | POA: Diagnosis not present

## 2022-12-17 DIAGNOSIS — T424X2A Poisoning by benzodiazepines, intentional self-harm, initial encounter: Secondary | ICD-10-CM

## 2022-12-17 DIAGNOSIS — E872 Acidosis, unspecified: Secondary | ICD-10-CM | POA: Diagnosis not present

## 2022-12-17 DIAGNOSIS — F13931 Sedative, hypnotic or anxiolytic use, unspecified with withdrawal delirium: Secondary | ICD-10-CM | POA: Diagnosis not present

## 2022-12-17 DIAGNOSIS — Z781 Physical restraint status: Secondary | ICD-10-CM | POA: Diagnosis not present

## 2022-12-17 DIAGNOSIS — Z888 Allergy status to other drugs, medicaments and biological substances status: Secondary | ICD-10-CM | POA: Diagnosis not present

## 2022-12-17 DIAGNOSIS — E785 Hyperlipidemia, unspecified: Secondary | ICD-10-CM | POA: Diagnosis not present

## 2022-12-17 DIAGNOSIS — Z882 Allergy status to sulfonamides status: Secondary | ICD-10-CM | POA: Diagnosis not present

## 2022-12-17 DIAGNOSIS — Z9151 Personal history of suicidal behavior: Secondary | ICD-10-CM | POA: Diagnosis not present

## 2022-12-17 DIAGNOSIS — R4182 Altered mental status, unspecified: Secondary | ICD-10-CM | POA: Diagnosis not present

## 2022-12-17 DIAGNOSIS — K219 Gastro-esophageal reflux disease without esophagitis: Secondary | ICD-10-CM

## 2022-12-17 DIAGNOSIS — F13231 Sedative, hypnotic or anxiolytic dependence with withdrawal delirium: Secondary | ICD-10-CM | POA: Diagnosis not present

## 2022-12-17 DIAGNOSIS — T50902D Poisoning by unspecified drugs, medicaments and biological substances, intentional self-harm, subsequent encounter: Secondary | ICD-10-CM | POA: Diagnosis not present

## 2022-12-17 DIAGNOSIS — J690 Pneumonitis due to inhalation of food and vomit: Secondary | ICD-10-CM | POA: Diagnosis not present

## 2022-12-17 DIAGNOSIS — Z7984 Long term (current) use of oral hypoglycemic drugs: Secondary | ICD-10-CM | POA: Diagnosis not present

## 2022-12-17 DIAGNOSIS — T424X2D Poisoning by benzodiazepines, intentional self-harm, subsequent encounter: Secondary | ICD-10-CM

## 2022-12-17 DIAGNOSIS — Z79899 Other long term (current) drug therapy: Secondary | ICD-10-CM | POA: Diagnosis not present

## 2022-12-17 DIAGNOSIS — G9341 Metabolic encephalopathy: Secondary | ICD-10-CM

## 2022-12-17 DIAGNOSIS — X58XXXA Exposure to other specified factors, initial encounter: Secondary | ICD-10-CM | POA: Diagnosis not present

## 2022-12-17 DIAGNOSIS — E782 Mixed hyperlipidemia: Secondary | ICD-10-CM | POA: Diagnosis not present

## 2022-12-17 DIAGNOSIS — F32A Depression, unspecified: Secondary | ICD-10-CM | POA: Diagnosis not present

## 2022-12-17 DIAGNOSIS — T50901A Poisoning by unspecified drugs, medicaments and biological substances, accidental (unintentional), initial encounter: Secondary | ICD-10-CM | POA: Diagnosis not present

## 2022-12-17 DIAGNOSIS — R451 Restlessness and agitation: Secondary | ICD-10-CM | POA: Diagnosis not present

## 2022-12-17 DIAGNOSIS — Z818 Family history of other mental and behavioral disorders: Secondary | ICD-10-CM | POA: Diagnosis not present

## 2022-12-17 DIAGNOSIS — F320 Major depressive disorder, single episode, mild: Secondary | ICD-10-CM

## 2022-12-17 DIAGNOSIS — Z79890 Hormone replacement therapy (postmenopausal): Secondary | ICD-10-CM | POA: Diagnosis not present

## 2022-12-17 DIAGNOSIS — G969 Disorder of central nervous system, unspecified: Secondary | ICD-10-CM

## 2022-12-17 LAB — CBC WITH DIFFERENTIAL/PLATELET
Abs Immature Granulocytes: 0.03 10*3/uL (ref 0.00–0.07)
Basophils Absolute: 0 10*3/uL (ref 0.0–0.1)
Basophils Relative: 1 %
Eosinophils Absolute: 0.2 10*3/uL (ref 0.0–0.5)
Eosinophils Relative: 2 %
HCT: 38.5 % (ref 36.0–46.0)
Hemoglobin: 12.4 g/dL (ref 12.0–15.0)
Immature Granulocytes: 0 %
Lymphocytes Relative: 38 %
Lymphs Abs: 3.3 10*3/uL (ref 0.7–4.0)
MCH: 30.8 pg (ref 26.0–34.0)
MCHC: 32.2 g/dL (ref 30.0–36.0)
MCV: 95.5 fL (ref 80.0–100.0)
Monocytes Absolute: 0.6 10*3/uL (ref 0.1–1.0)
Monocytes Relative: 7 %
Neutro Abs: 4.6 10*3/uL (ref 1.7–7.7)
Neutrophils Relative %: 52 %
Platelets: 299 10*3/uL (ref 150–400)
RBC: 4.03 MIL/uL (ref 3.87–5.11)
RDW: 13.5 % (ref 11.5–15.5)
WBC: 8.7 10*3/uL (ref 4.0–10.5)
nRBC: 0 % (ref 0.0–0.2)

## 2022-12-17 LAB — BLOOD GAS, VENOUS
Acid-Base Excess: 1.7 mmol/L (ref 0.0–2.0)
Bicarbonate: 28.2 mmol/L — ABNORMAL HIGH (ref 20.0–28.0)
Drawn by: 1528
O2 Saturation: 69.7 %
Patient temperature: 36.3
pCO2, Ven: 50 mmHg (ref 44–60)
pH, Ven: 7.36 (ref 7.25–7.43)
pO2, Ven: 37 mmHg (ref 32–45)

## 2022-12-17 LAB — BLOOD GAS, ARTERIAL
Acid-Base Excess: 0.2 mmol/L (ref 0.0–2.0)
Bicarbonate: 26.9 mmol/L (ref 20.0–28.0)
Drawn by: 34762
FIO2: 28 %
O2 Saturation: 99.3 %
Patient temperature: 36.6
pCO2 arterial: 50 mmHg — ABNORMAL HIGH (ref 32–48)
pH, Arterial: 7.34 — ABNORMAL LOW (ref 7.35–7.45)
pO2, Arterial: 88 mmHg (ref 83–108)

## 2022-12-17 LAB — COMPREHENSIVE METABOLIC PANEL
ALT: 27 U/L (ref 0–44)
AST: 18 U/L (ref 15–41)
Albumin: 3.4 g/dL — ABNORMAL LOW (ref 3.5–5.0)
Alkaline Phosphatase: 86 U/L (ref 38–126)
Anion gap: 6 (ref 5–15)
BUN: 11 mg/dL (ref 6–20)
CO2: 26 mmol/L (ref 22–32)
Calcium: 8.7 mg/dL — ABNORMAL LOW (ref 8.9–10.3)
Chloride: 105 mmol/L (ref 98–111)
Creatinine, Ser: 0.76 mg/dL (ref 0.44–1.00)
GFR, Estimated: >60 mL/min (ref >60)
Glucose, Bld: 102 mg/dL — ABNORMAL HIGH (ref 70–99)
Potassium: 4.6 mmol/L (ref 3.5–5.1)
Sodium: 137 mmol/L (ref 135–145)
Total Bilirubin: 0.7 mg/dL (ref 0.3–1.2)
Total Protein: 6.7 g/dL (ref 6.5–8.1)

## 2022-12-17 LAB — HIV ANTIBODY (ROUTINE TESTING W REFLEX): HIV Screen 4th Generation wRfx: NONREACTIVE

## 2022-12-17 LAB — MAGNESIUM: Magnesium: 2.1 mg/dL (ref 1.7–2.4)

## 2022-12-17 NOTE — Assessment & Plan Note (Signed)
Continue Synthroid °

## 2022-12-17 NOTE — Progress Notes (Signed)
ASSUMPTION OF CARE NOTE   12/17/2022 2:06 PM  Chelsea Gross was seen and examined.  The H&P by the admitting provider, orders, imaging was reviewed.  Please see new orders.  Will continue to follow.   Pt needs ongoing medical work up and treatment.  ABG this morning was reassuring.  Discussed with PCCM team.  Continue supportive measures.  Continue to monitor in the stepdown ICU with safety sitter one on one.    arterial blood gases 12/17/2022  ABG    Component Value Date/Time   PHART 7.34 (L) 12/17/2022 0845   PCO2ART 50 (H) 12/17/2022 0845   PO2ART 88 12/17/2022 0845   HCO3 26.9 12/17/2022 0845   O2SAT 99.3 12/17/2022 0845     Vitals:   12/17/22 0900 12/17/22 1136  BP: 113/64   Pulse: 73   Resp: 13   Temp:  97.6 F (36.4 C)  SpO2: 99%     Results for orders placed or performed during the hospital encounter of 12/16/22  MRSA Next Gen by PCR, Nasal   Specimen: Nasal Mucosa; Nasal Swab  Result Value Ref Range   MRSA by PCR Next Gen NOT DETECTED NOT DETECTED  Comprehensive metabolic panel  Result Value Ref Range   Sodium 137 135 - 145 mmol/L   Potassium 3.6 3.5 - 5.1 mmol/L   Chloride 104 98 - 111 mmol/L   CO2 26 22 - 32 mmol/L   Glucose, Bld 103 (H) 70 - 99 mg/dL   BUN 11 6 - 20 mg/dL   Creatinine, Ser 1.61 0.44 - 1.00 mg/dL   Calcium 8.9 8.9 - 09.6 mg/dL   Total Protein 6.6 6.5 - 8.1 g/dL   Albumin 3.6 3.5 - 5.0 g/dL   AST 19 15 - 41 U/L   ALT 27 0 - 44 U/L   Alkaline Phosphatase 87 38 - 126 U/L   Total Bilirubin 0.9 0.3 - 1.2 mg/dL   GFR, Estimated >04 >54 mL/min   Anion gap 7 5 - 15  Salicylate level  Result Value Ref Range   Salicylate Lvl <7.0 (L) 7.0 - 30.0 mg/dL  Acetaminophen level  Result Value Ref Range   Acetaminophen (Tylenol), Serum <10 (L) 10 - 30 ug/mL  Ethanol  Result Value Ref Range   Alcohol, Ethyl (B) <10 <10 mg/dL  Urine rapid drug screen (hosp performed)  Result Value Ref Range   Opiates NONE DETECTED NONE DETECTED   Cocaine NONE  DETECTED NONE DETECTED   Benzodiazepines POSITIVE (A) NONE DETECTED   Amphetamines NONE DETECTED NONE DETECTED   Tetrahydrocannabinol NONE DETECTED NONE DETECTED   Barbiturates NONE DETECTED NONE DETECTED  CBC WITH DIFFERENTIAL  Result Value Ref Range   WBC 8.4 4.0 - 10.5 K/uL   RBC 4.06 3.87 - 5.11 MIL/uL   Hemoglobin 12.6 12.0 - 15.0 g/dL   HCT 09.8 11.9 - 14.7 %   MCV 94.8 80.0 - 100.0 fL   MCH 31.0 26.0 - 34.0 pg   MCHC 32.7 30.0 - 36.0 g/dL   RDW 82.9 56.2 - 13.0 %   Platelets 290 150 - 400 K/uL   nRBC 0.0 0.0 - 0.2 %   Neutrophils Relative % 57 %   Neutro Abs 4.8 1.7 - 7.7 K/uL   Lymphocytes Relative 33 %   Lymphs Abs 2.7 0.7 - 4.0 K/uL   Monocytes Relative 7 %   Monocytes Absolute 0.6 0.1 - 1.0 K/uL   Eosinophils Relative 2 %   Eosinophils Absolute 0.2 0.0 -  0.5 K/uL   Basophils Relative 1 %   Basophils Absolute 0.1 0.0 - 0.1 K/uL   Immature Granulocytes 0 %   Abs Immature Granulocytes 0.03 0.00 - 0.07 K/uL  Acetaminophen level  Result Value Ref Range   Acetaminophen (Tylenol), Serum <10 (L) 10 - 30 ug/mL  HIV Antibody (routine testing w rflx)  Result Value Ref Range   HIV Screen 4th Generation wRfx Non Reactive Non Reactive  Comprehensive metabolic panel  Result Value Ref Range   Sodium 137 135 - 145 mmol/L   Potassium 4.6 3.5 - 5.1 mmol/L   Chloride 105 98 - 111 mmol/L   CO2 26 22 - 32 mmol/L   Glucose, Bld 102 (H) 70 - 99 mg/dL   BUN 11 6 - 20 mg/dL   Creatinine, Ser 1.61 0.44 - 1.00 mg/dL   Calcium 8.7 (L) 8.9 - 10.3 mg/dL   Total Protein 6.7 6.5 - 8.1 g/dL   Albumin 3.4 (L) 3.5 - 5.0 g/dL   AST 18 15 - 41 U/L   ALT 27 0 - 44 U/L   Alkaline Phosphatase 86 38 - 126 U/L   Total Bilirubin 0.7 0.3 - 1.2 mg/dL   GFR, Estimated >09 >60 mL/min   Anion gap 6 5 - 15  CBC with Differential/Platelet  Result Value Ref Range   WBC 8.7 4.0 - 10.5 K/uL   RBC 4.03 3.87 - 5.11 MIL/uL   Hemoglobin 12.4 12.0 - 15.0 g/dL   HCT 45.4 09.8 - 11.9 %   MCV 95.5 80.0 -  100.0 fL   MCH 30.8 26.0 - 34.0 pg   MCHC 32.2 30.0 - 36.0 g/dL   RDW 14.7 82.9 - 56.2 %   Platelets 299 150 - 400 K/uL   nRBC 0.0 0.0 - 0.2 %   Neutrophils Relative % 52 %   Neutro Abs 4.6 1.7 - 7.7 K/uL   Lymphocytes Relative 38 %   Lymphs Abs 3.3 0.7 - 4.0 K/uL   Monocytes Relative 7 %   Monocytes Absolute 0.6 0.1 - 1.0 K/uL   Eosinophils Relative 2 %   Eosinophils Absolute 0.2 0.0 - 0.5 K/uL   Basophils Relative 1 %   Basophils Absolute 0.0 0.0 - 0.1 K/uL   Immature Granulocytes 0 %   Abs Immature Granulocytes 0.03 0.00 - 0.07 K/uL  Magnesium  Result Value Ref Range   Magnesium 2.1 1.7 - 2.4 mg/dL  Blood gas, venous  Result Value Ref Range   pH, Ven 7.36 7.25 - 7.43   pCO2, Ven 50 44 - 60 mmHg   pO2, Ven 37 32 - 45 mmHg   Bicarbonate 28.2 (H) 20.0 - 28.0 mmol/L   Acid-Base Excess 1.7 0.0 - 2.0 mmol/L   O2 Saturation 69.7 %   Patient temperature 36.3    Collection site BLOOD RIGHT ARM    Drawn by [id number]   Blood gas, arterial  Result Value Ref Range   FIO2 28.0 %   pH, Arterial 7.34 (L) 7.35 - 7.45   pCO2 arterial 50 (H) 32 - 48 mmHg   pO2, Arterial 88 83 - 108 mmHg   Bicarbonate 26.9 20.0 - 28.0 mmol/L   Acid-Base Excess 0.2 0.0 - 2.0 mmol/L   O2 Saturation 99.3 %   Patient temperature 36.6    Collection site RIGHT RADIAL    Drawn by [id number]    Allens test (pass/fail) PASS PASS  CBG monitoring, ED  Result Value Ref Range   Glucose-Capillary 95 70 -  99 mg/dL   Maryln Manuel, MD [facility]   12/16/2022  6:25 PM How to contact the [facility] Attending or Consulting provider 7A - 7P or covering provider during after hours 7P -7A, for this patient?  Check the care team in [facility] and look for a) attending/consulting [facility] provider listed and b) the [facility] team listed Log into [email or web address] and use [place]'s universal password to access. If you do not have the password, please contact the hospital operator. Locate the [facility] provider you are looking for under [facility]  and page to a number that you can be directly reached. If you still have difficulty reaching the provider, please page the [facility] (Director on Call) for the Hospitalists listed on amion for assistance.

## 2022-12-17 NOTE — Assessment & Plan Note (Signed)
-   Continue Cymbalta - Patient will need psych consult after Xanax is washed out - Chief Executive Officer

## 2022-12-17 NOTE — Assessment & Plan Note (Signed)
-   Seems to be intentional overdose as patient took about 50 pills of her Xanax - She will need psych consult when she is medically cleared - Continue fluids and continue to monitor - Admit to stepdown

## 2022-12-17 NOTE — Assessment & Plan Note (Signed)
-   Continue Cymbalta - Holding Xanax in the setting of Xanax overdose

## 2022-12-17 NOTE — Assessment & Plan Note (Signed)
-   Confused and not answering questions appropriately - Due Xanax overdose

## 2022-12-17 NOTE — Progress Notes (Signed)
Poison control contacted. They have been following the pt throughout the night. Vitals and neurological status given. No further recommendations at this time.

## 2022-12-17 NOTE — Progress Notes (Signed)
Patient remains sleeping awakes intermittently with confusion, 1;1 present, patient repositioned for comfort and has b/l mittens on for safety (pulling at O2 and IV).

## 2022-12-17 NOTE — Assessment & Plan Note (Signed)
Continue PPI ?

## 2022-12-17 NOTE — Progress Notes (Signed)
Transition of Care [facility]) - Inpatient Brief Assessment   Patient Details  Name: Chelsea Gross MRN: [id number] Date of Birth: 02/19/1966  Transition of Care [facility]) CM/SW Contact:    Leitha Bleak, RN Phone Number: 12/17/2022, 12:58 PM   Clinical Narrative:   Continuing medical work up, Will need TTS consult to assess. DC planing 2-3 days.  Transition of Care Asessment: Insurance and Status: (P) Insurance coverage has been reviewed Patient has primary care physician: (P) Yes Home environment has been reviewed: (P) home with spouse Prior level of function:: (P) independant Prior/Current Home Services: (P) No current home services Social Determinants of Health Reivew: (P) SDOH reviewed no interventions necessary Readmission risk has been reviewed: (P) Yes Transition of care needs: (P) transition of care needs identified, TOC will continue to follow
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
## 2022-12-17 NOTE — Progress Notes (Signed)
   12/17/22 1430  Spiritual Encounters  Type of Visit Initial  Care provided to: Family  Referral source Nurse (RN/NT/LPN)  Reason for visit Routine spiritual support  OnCall Visit No   Chaplain responded to a request from patient's nurse to stop in read the room. Several family members were gathered and watching the patient, Chelsea Gross. The overall statement is that they are doing as well as can be expected at this time. Their concern is for their loved one right now. They were not interested in talking with a chaplain.   Valerie Roys [facility] [phone]

## 2022-12-17 NOTE — Assessment & Plan Note (Signed)
Continue statin. 

## 2022-12-18 DIAGNOSIS — T50902D Poisoning by unspecified drugs, medicaments and biological substances, intentional self-harm, subsequent encounter: Secondary | ICD-10-CM | POA: Diagnosis not present

## 2022-12-18 DIAGNOSIS — R451 Restlessness and agitation: Secondary | ICD-10-CM | POA: Diagnosis not present

## 2022-12-18 DIAGNOSIS — T424X2A Poisoning by benzodiazepines, intentional self-harm, initial encounter: Secondary | ICD-10-CM | POA: Diagnosis not present

## 2022-12-18 DIAGNOSIS — G9341 Metabolic encephalopathy: Secondary | ICD-10-CM | POA: Diagnosis not present

## 2022-12-18 LAB — GLUCOSE, CAPILLARY
Glucose-Capillary: 107 mg/dL — ABNORMAL HIGH (ref 70–99)
Glucose-Capillary: 77 mg/dL (ref 70–99)
Glucose-Capillary: 78 mg/dL (ref 70–99)
Glucose-Capillary: 82 mg/dL (ref 70–99)
Glucose-Capillary: 87 mg/dL (ref 70–99)
Glucose-Capillary: 89 mg/dL (ref 70–99)
Glucose-Capillary: 93 mg/dL (ref 70–99)
Glucose-Capillary: 98 mg/dL (ref 70–99)

## 2022-12-18 LAB — COMPREHENSIVE METABOLIC PANEL
ALT: 23 U/L (ref 0–44)
AST: 16 U/L (ref 15–41)
Albumin: 3.5 g/dL (ref 3.5–5.0)
Alkaline Phosphatase: 88 U/L (ref 38–126)
Anion gap: 8 (ref 5–15)
BUN: 14 mg/dL (ref 6–20)
CO2: 22 mmol/L (ref 22–32)
Calcium: 8.4 mg/dL — ABNORMAL LOW (ref 8.9–10.3)
Chloride: 107 mmol/L (ref 98–111)
Creatinine, Ser: 0.74 mg/dL (ref 0.44–1.00)
GFR, Estimated: >60 mL/min (ref >60)
Glucose, Bld: 97 mg/dL (ref 70–99)
Potassium: 3.4 mmol/L — ABNORMAL LOW (ref 3.5–5.1)
Sodium: 137 mmol/L (ref 135–145)
Total Bilirubin: 1 mg/dL (ref 0.3–1.2)
Total Protein: 6.8 g/dL (ref 6.5–8.1)

## 2022-12-18 LAB — CK: Total CK: 115 U/L (ref 38–234)

## 2022-12-18 LAB — MAGNESIUM: Magnesium: 1.9 mg/dL (ref 1.7–2.4)

## 2022-12-18 MED ORDER — HALOPERIDOL LACTATE 5 MG/ML IJ SOLN
5.0000 mg | Freq: Once | INTRAMUSCULAR | Status: AC
  Administered 2022-12-18: 5 mg via INTRAVENOUS
  Filled 2022-12-18: qty 1

## 2022-12-18 MED ORDER — PANTOPRAZOLE SODIUM 40 MG IV SOLR: 40.0000 mg | INTRAVENOUS | Status: DC

## 2022-12-18 MED ORDER — POTASSIUM CHLORIDE 10 MEQ/100ML IV SOLN
10.0000 meq | INTRAVENOUS | Status: AC
  Administered 2022-12-18 (×3): 10 meq via INTRAVENOUS
  Filled 2022-12-18 (×3): qty 100

## 2022-12-18 MED ORDER — METOPROLOL TARTRATE 5 MG/5ML IV SOLN
2.5000 mg | Freq: Four times a day (QID) | INTRAVENOUS | Status: DC
  Administered 2022-12-18 – 2022-12-22 (×10): 2.5 mg via INTRAVENOUS
  Filled 2022-12-18 (×10): qty 5

## 2022-12-18 MED ORDER — HALOPERIDOL LACTATE 5 MG/ML IJ SOLN: 5.0000 mg | Freq: Four times a day (QID) | INTRAMUSCULAR | Status: DC | PRN

## 2022-12-18 MED ORDER — HALOPERIDOL LACTATE 5 MG/ML IJ SOLN
5.0000 mg | Freq: Four times a day (QID) | INTRAMUSCULAR | Status: DC | PRN
  Administered 2022-12-18 – 2022-12-21 (×7): 5 mg via INTRAVENOUS
  Filled 2022-12-18 (×9): qty 1

## 2022-12-18 MED ORDER — FAMOTIDINE IN NACL 20-0.9 MG/50ML-% IV SOLN
20.0000 mg | INTRAVENOUS | Status: DC
  Administered 2022-12-18 – 2022-12-21 (×4): 20 mg via INTRAVENOUS
  Filled 2022-12-18 (×4): qty 50

## 2022-12-18 MED ORDER — DEXTROSE 5 % IV SOLN: INTRAVENOUS | Status: DC
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
## 2022-12-18 NOTE — Progress Notes (Signed)
Patient reportedly combative and striking at staff. Restraints ordered.
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
## 2022-12-18 NOTE — TOC Initial Note (Signed)
Transition of Care [facility]) - Initial/Assessment Note    Patient Details  Name: Chelsea Gross MRN: [id number] Date of Birth: Feb 19, 1966  Transition of Care [facility]) CM/SW Contact:    Leitha Bleak, RN Phone Number: 12/18/2022, 9:50 AM  Clinical Narrative:      CM spoke with daughter, Doree Fudge.  She and her aunt Aurea Graff are at the hospital. Patient is to confused and combative to have TTS assessment today.  They will be available to be here with TTS is schedule if allowed. They have concerns and want inpatient psy admission. TOC following. RN updated.           Expected Discharge Plan: Psychiatric Hospital Barriers to Discharge: Continued Medical Work up   Patient Goals and CMS Choice Patient states their goals for this hospitalization and ongoing recovery are:: family wants inpatient psy admission CMS Medicare.gov Compare Post Acute Care list provided to:: Patient Represenative (must comment)       Expected Discharge Plan and Services      Living arrangements for the past 2 months: Skilled Nursing Facility                     Prior Living Arrangements/Services Living arrangements for the past 2 months: Skilled Nursing Facility Lives with:: Spouse Patient language and need for interpreter reviewed:: Yes        Need for Family Participation in Patient Care: Yes (Comment) Care giver support system in place?: Yes (comment)   Criminal Activity/Legal Involvement Pertinent to Current Situation/Hospitalization: No - Comment as needed     Emotional Assessment   Attitude/Demeanor/Rapport: Combative       Psych Involvement: Yes (comment)  Admission diagnosis:  Overdose [T50.901A] Intentional overdose, initial encounter (HCC) [T50.902A] Benzodiazepine overdose, intentional self-harm, initial encounter (HCC) [T42.4X2A] Patient Active Problem List   Diagnosis Date Noted   Acute metabolic encephalopathy 12/17/2022   Intentional benzodiazepine overdose (HCC) 12/17/2022   CNS  depression (HCC) 12/17/2022   Benzodiazepine overdose, intentional self-harm, initial encounter (HCC) 12/17/2022   Overdose 12/16/2022   Class 1 obesity in adult 09/24/2022   Guaiac positive stools 09/24/2022   Prediabetes 09/18/2021   Actinic keratosis 04/09/2021   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   Preoperative cardiovascular examination 05/10/2020   Anxiety    GERD (gastroesophageal reflux disease)    History of palpitations    Hypothyroid    PMB (postmenopausal bleeding)    Thyroid disease    Urinary incontinence    Palpitations 10/13/2019   Chest discomfort 10/13/2019   Generalized anxiety disorder 09/21/2018   Controlled substance agreement signed 09/21/2018   Depression 04/16/2016   Hyperlipidemia 04/16/2016   Hypothyroidism 04/16/2016   PCP:  Raliegh Ip, DO Pharmacy:   [facility] [id number] [place], [place] - [address] [place] [address] Phone: [phone] Fax: [phone]  [id number] - [place], [place] - [address] [place] [address] Phone: [phone] Fax: [phone]     Social Determinants of Health (SDOH) Social History: SDOH Screenings   Depression (PHQ2-9): Medium Risk (12/02/2022)  Tobacco Use: Low Risk  (12/16/2022)   SDOH Interventions:     Readmission Risk Interventions    12/18/2022    9:44 AM  Readmission Risk Prevention Plan  Post Dischage Appt Not Complete  Medication Screening Complete  Transportation Screening Complete
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
## 2022-12-19 ENCOUNTER — Encounter (HOSPITAL_COMMUNITY): Payer: Self-pay

## 2022-12-19 ENCOUNTER — Encounter (HOSPITAL_COMMUNITY): Payer: Self-pay | Admitting: Family Medicine

## 2022-12-19 DIAGNOSIS — E039 Hypothyroidism, unspecified: Secondary | ICD-10-CM | POA: Diagnosis not present

## 2022-12-19 DIAGNOSIS — T50902D Poisoning by unspecified drugs, medicaments and biological substances, intentional self-harm, subsequent encounter: Secondary | ICD-10-CM | POA: Diagnosis not present

## 2022-12-19 DIAGNOSIS — F411 Generalized anxiety disorder: Secondary | ICD-10-CM | POA: Diagnosis not present

## 2022-12-19 DIAGNOSIS — G9341 Metabolic encephalopathy: Secondary | ICD-10-CM | POA: Diagnosis not present

## 2022-12-19 LAB — GLUCOSE, CAPILLARY
Glucose-Capillary: 103 mg/dL — ABNORMAL HIGH (ref 70–99)
Glucose-Capillary: 116 mg/dL — ABNORMAL HIGH (ref 70–99)
Glucose-Capillary: 103 mg/dL — ABNORMAL HIGH (ref 70–99)
Glucose-Capillary: 123 mg/dL — ABNORMAL HIGH (ref 70–99)
Glucose-Capillary: 126 mg/dL — ABNORMAL HIGH (ref 70–99)

## 2022-12-19 LAB — COMPREHENSIVE METABOLIC PANEL
ALT: 22 U/L (ref 0–44)
AST: 25 U/L (ref 15–41)
Albumin: 3.8 g/dL (ref 3.5–5.0)
Alkaline Phosphatase: 91 U/L (ref 38–126)
Anion gap: 13 (ref 5–15)
BUN: 9 mg/dL (ref 6–20)
CO2: 21 mmol/L — ABNORMAL LOW (ref 22–32)
Calcium: 8.8 mg/dL — ABNORMAL LOW (ref 8.9–10.3)
Chloride: 103 mmol/L (ref 98–111)
Creatinine, Ser: 0.61 mg/dL (ref 0.44–1.00)
GFR, Estimated: >60 mL/min (ref >60)
Glucose, Bld: 96 mg/dL (ref 70–99)
Potassium: 4 mmol/L (ref 3.5–5.1)
Sodium: 137 mmol/L (ref 135–145)
Total Bilirubin: 1.4 mg/dL — ABNORMAL HIGH (ref 0.3–1.2)
Total Protein: 7.2 g/dL (ref 6.5–8.1)

## 2022-12-19 MED ORDER — DEXMEDETOMIDINE HCL IN NACL 400 MCG/100ML IV SOLN
0.0000 ug/kg/h | INTRAVENOUS | Status: DC
  Administered 2022-12-19: 0.8 ug/kg/h via INTRAVENOUS
  Administered 2022-12-19: 0.2 ug/kg/h via INTRAVENOUS
  Administered 2022-12-20: 0.6 ug/kg/h via INTRAVENOUS
  Administered 2022-12-20: 0.8 ug/kg/h via INTRAVENOUS
  Administered 2022-12-20: 1 ug/kg/h via INTRAVENOUS
  Administered 2022-12-20: 0.8 ug/kg/h via INTRAVENOUS
  Administered 2022-12-20 – 2022-12-21 (×3): 1 ug/kg/h via INTRAVENOUS
  Administered 2022-12-21: 0.8 ug/kg/h via INTRAVENOUS
  Administered 2022-12-21: 1 ug/kg/h via INTRAVENOUS
  Administered 2022-12-22: 1.1 ug/kg/h via INTRAVENOUS
  Administered 2022-12-22: 0.6 ug/kg/h via INTRAVENOUS
  Filled 2022-12-19 (×13): qty 100

## 2022-12-19 MED ORDER — DEXTROSE-SODIUM CHLORIDE 5-0.9 % IV SOLN: INTRAVENOUS | Status: DC

## 2022-12-19 NOTE — Progress Notes (Addendum)
PROGRESS NOTE   Chelsea Gross  ZHY:[id number] DOB: Apr 15, 1966 DOA: 12/16/2022 PCP: Raliegh Ip, DO   Chief Complaint  Patient presents with   Drug Overdose    SI   Level of care: Stepdown  Brief Admission History:  57 y.o. female with medical history significant of depression, anxiety, GERD, hyperlipidemia, hypothyroidism, suicidal behavior with attempted self-injury, and more presents the ED with a chief complaint of overdose.  Is reported that she got in a fight with her husband.  She was quite upset about it so she talked to the children.  She then took 50 Xanax pills.  She then called a different family member and told them what she had done.  EMS was called.  No further complaints could be discussed at this time due to patient's altered mental status. Pt has been combattive at times, yelling out and striking out at staff.    Assessment and Plan:  Intentional Benzodiazepine Overdose - Seems to be intentional overdose as patient reportedly took about 50 pills of her spouse's Xanax (alprazolam) - She will need TTS consult when she is medically cleared - Continue fluids and continue to monitor - continue stepdown care, have been discussing case with PCCM in [place], discussed case again on 5/25 with Dr. Wynona Neat  Acute metabolic encephalopathy - Confused and not answering questions appropriately - Due Xanax overdose - Pt remains encephalopathic, now requiring safety sitter and restraints for staff/pt safety; added IV haloperidol for severe agitation 5/24  - 5/25 severe agitation persists, discussed with PCCM Dr. Wynona Neat and recommendation was to start IV precedex infusion titrated,continue for 24 hours then start to wean, this is being done to prevent patient from injuring herself or others   Hypothyroid - Continue Synthroid when able to take p.o. again  GERD (gastroesophageal reflux disease) - Continue PPI (changed to IV as pt not able to take p.o. at this  time)  Generalized anxiety disorder - Continue Cymbalta - Holding Xanax in the setting of Xanax overdose (reportedly is not prescribed this but was taking the husband's prescription)  Hyperlipidemia - Continue statin when able to take oral   Depression - Intentional suicide attempt - Continue Cymbalta - Patient will need psych consult when medically cleared  - One-on-one safety sitter at bedside   DVT prophylaxis: [place] heparin  Code Status: F Family Communication: discussed case in depth with both sisters at bedside 5/25  Disposition: Status is: Inpatient Remains inpatient appropriate because: intensity    Consultants:  PCCM - discussed case with Dr. Wynona Neat 5/25 Procedures:   Antimicrobials:    Subjective: Pt still having bouts of severe agitation.   Objective: Vitals:   12/19/22 1500 12/19/22 1530 12/19/22 1533 12/19/22 1633  BP: (!) 85/38 (!) 93/47 (!) 104/46   Pulse: (!) 57 64 65   Resp: 15 15 13    Temp:    97.8 F (36.6 C)  TempSrc:    Axillary  SpO2: 97% 97% 100%   Weight:      Height:        Intake/Output Summary (Last 24 hours) at 12/19/2022 1720 Last data filed at 12/19/2022 1538 Gross per 24 hour  Intake 1472.12 ml  Output 1550 ml  Net -77.88 ml   Filed Weights   12/16/22 1834 12/17/22 0500 12/18/22 0500  Weight: 91.6 kg 91.3 kg 91.3 kg   Examination:  General exam: Appears calm and comfortable  Respiratory system: Clear to auscultation. Respiratory effort normal. Cardiovascular system: normal S1 & S2 heard. No  JVD, murmurs, rubs, gallops or clicks. No pedal edema. Gastrointestinal system: Abdomen is nondistended, soft and nontender. No organomegaly or masses felt. Normal bowel sounds heard. Central nervous system: Alert and oriented. No focal neurological deficits. Extremities: Symmetric 5 x 5 power. Skin: No rashes, lesions or ulcers. Psychiatry: Judgement and insight appear normal. Mood & affect appropriate.   Data Reviewed: I have personally  reviewed following labs and imaging studies  CBC: Recent Labs  Lab 12/16/22 1853 12/17/22 0452  WBC 8.4 8.7  NEUTROABS 4.8 4.6  HGB 12.6 12.4  HCT 38.5 38.5  MCV 94.8 95.5  PLT 290 299    Basic Metabolic Panel: Recent Labs  Lab 12/16/22 1853 12/17/22 0452 12/18/22 0445 12/19/22 0316  NA 137 137 137 137  K 3.6 4.6 3.4* 4.0  CL 104 105 107 103  CO2 26 26 22  21*  GLUCOSE 103* 102* 97 96  BUN 11 11 14 9   CREATININE 0.90 0.76 0.74 0.61  CALCIUM 8.9 8.7* 8.4* 8.8*  MG  --  2.1 1.9  --     CBG: Recent Labs  Lab 12/18/22 2148 12/18/22 2354 12/19/22 0409 12/19/22 1143 12/19/22 1630  GLUCAP 78 87 103* 116* 103*    Recent Results (from the past 240 hour(s))  MRSA Next Gen by PCR, Nasal     Status: None   Collection Time: 12/16/22 10:23 PM   Specimen: Nasal Mucosa; Nasal Swab  Result Value Ref Range Status   MRSA by PCR Next Gen NOT DETECTED NOT DETECTED Final    Comment: (NOTE) The GeneXpert MRSA Assay (FDA approved for NASAL specimens only), is one component of a comprehensive MRSA colonization surveillance program. It is not intended to diagnose MRSA infection nor to guide or monitor treatment for MRSA infections. Test performance is not FDA approved in patients less than 60 years old. Performed at [facility], [address] [place], [place] [address]      Radiology Studies: No results found.  Scheduled Meds:  atorvastatin  40 mg Oral Daily   Chlorhexidine Gluconate Cloth  6 each Topical Daily   DULoxetine  60 mg Oral Daily   heparin  5,000 Units Subcutaneous Q8H   levothyroxine  25 mcg Oral QAC breakfast   metoprolol tartrate  2.5 mg Intravenous Q6H   Continuous Infusions:  dexmedetomidine (PRECEDEX) IV infusion 0.1 mcg/kg/hr (12/19/22 1538)   dextrose 5 % and 0.9 % NaCl 75 mL/hr at 12/19/22 1500   famotidine (PEPCID) IV Stopped (12/18/22 2054)     LOS: 2 days   Critical Care Procedure Note Authorized and Performed by: Maryln Manuel MD  Total  Critical Care time:  54 mins Due to a high probability of clinically significant, life threatening deterioration, the patient required my highest level of preparedness to intervene emergently and I personally spent this critical care time directly and personally managing the patient.  This critical care time included obtaining a history; examining the patient, pulse oximetry; ordering and review of studies; arranging urgent treatment with development of a management plan; evaluation of patient's response of treatment; frequent reassessment; and discussions with other providers.  This critical care time was performed to assess and manage the high probability of imminent and life threatening deterioration that could result in multi-organ failure.  It was exclusive of separately billable procedures and treating other patients and teaching time.    Standley Dakins, MD How to contact the [facility] Attending or Consulting provider 7A - 7P or covering provider during after hours 7P -7A,  for this patient?  Check the care team in [facility] and look for a) attending/consulting TRH provider listed and b) the [facility] team listed Log into [email or web address] and use [place]'s universal password to access. If you do not have the password, please contact the hospital operator. Locate the [facility] provider you are looking for under [facility] and page to a number that you can be directly reached. If you still have difficulty reaching the provider, please page the [facility] (Director on Call) for the Hospitalists listed on amion for assistance.  12/19/2022, 5:20 PM

## 2022-12-19 NOTE — Progress Notes (Signed)
Spoke with Dr. Laural Benes about patient with benzodiazepine overdose  Agitation, delirium requiring doses of Haldol but still uncontrolled  Appropriate to start on Precedex  May be kept on Precedex for about 24 hours and then start to wean as tolerated Goal is to have her not be a danger to herself or others  Close clinical monitoring

## 2022-12-20 ENCOUNTER — Inpatient Hospital Stay (HOSPITAL_COMMUNITY): Payer: 59

## 2022-12-20 DIAGNOSIS — G9341 Metabolic encephalopathy: Secondary | ICD-10-CM | POA: Diagnosis not present

## 2022-12-20 DIAGNOSIS — F411 Generalized anxiety disorder: Secondary | ICD-10-CM | POA: Diagnosis not present

## 2022-12-20 DIAGNOSIS — T50902D Poisoning by unspecified drugs, medicaments and biological substances, intentional self-harm, subsequent encounter: Secondary | ICD-10-CM | POA: Diagnosis not present

## 2022-12-20 DIAGNOSIS — E039 Hypothyroidism, unspecified: Secondary | ICD-10-CM | POA: Diagnosis not present

## 2022-12-20 LAB — CBC WITH DIFFERENTIAL/PLATELET
Abs Immature Granulocytes: 0.03 10*3/uL (ref 0.00–0.07)
Basophils Absolute: 0 10*3/uL (ref 0.0–0.1)
Basophils Relative: 0 %
Eosinophils Absolute: 0.1 10*3/uL (ref 0.0–0.5)
Eosinophils Relative: 1 %
HCT: 39.1 % (ref 36.0–46.0)
Hemoglobin: 12.9 g/dL (ref 12.0–15.0)
Immature Granulocytes: 0 %
Lymphocytes Relative: 19 %
Lymphs Abs: 1.7 10*3/uL (ref 0.7–4.0)
MCH: 30.9 pg (ref 26.0–34.0)
MCHC: 33 g/dL (ref 30.0–36.0)
MCV: 93.8 fL (ref 80.0–100.0)
Monocytes Absolute: 1 10*3/uL (ref 0.1–1.0)
Monocytes Relative: 10 %
Neutro Abs: 6.3 10*3/uL (ref 1.7–7.7)
Neutrophils Relative %: 70 %
Platelets: 258 10*3/uL (ref 150–400)
RBC: 4.17 MIL/uL (ref 3.87–5.11)
RDW: 13 % (ref 11.5–15.5)
WBC: 9.1 10*3/uL (ref 4.0–10.5)
nRBC: 0 % (ref 0.0–0.2)

## 2022-12-20 LAB — COMPREHENSIVE METABOLIC PANEL
ALT: 27 U/L (ref 0–44)
AST: 28 U/L (ref 15–41)
Albumin: 3.2 g/dL — ABNORMAL LOW (ref 3.5–5.0)
Alkaline Phosphatase: 76 U/L (ref 38–126)
Anion gap: 12 (ref 5–15)
BUN: 7 mg/dL (ref 6–20)
CO2: 18 mmol/L — ABNORMAL LOW (ref 22–32)
Calcium: 8.2 mg/dL — ABNORMAL LOW (ref 8.9–10.3)
Chloride: 106 mmol/L (ref 98–111)
Creatinine, Ser: 0.67 mg/dL (ref 0.44–1.00)
GFR, Estimated: >60 mL/min (ref >60)
Glucose, Bld: 166 mg/dL — ABNORMAL HIGH (ref 70–99)
Potassium: 3.8 mmol/L (ref 3.5–5.1)
Sodium: 136 mmol/L (ref 135–145)
Total Bilirubin: 1.1 mg/dL (ref 0.3–1.2)
Total Protein: 6.6 g/dL (ref 6.5–8.1)

## 2022-12-20 LAB — GLUCOSE, CAPILLARY
Glucose-Capillary: 127 mg/dL — ABNORMAL HIGH (ref 70–99)
Glucose-Capillary: 153 mg/dL — ABNORMAL HIGH (ref 70–99)
Glucose-Capillary: 154 mg/dL — ABNORMAL HIGH (ref 70–99)
Glucose-Capillary: 159 mg/dL — ABNORMAL HIGH (ref 70–99)
Glucose-Capillary: 162 mg/dL — ABNORMAL HIGH (ref 70–99)
Glucose-Capillary: 176 mg/dL — ABNORMAL HIGH (ref 70–99)

## 2022-12-20 LAB — MAGNESIUM: Magnesium: 1.9 mg/dL (ref 1.7–2.4)

## 2022-12-20 MED ORDER — THIAMINE HCL 100 MG/ML IJ SOLN
500.0000 mg | INTRAVENOUS | Status: AC
  Administered 2022-12-20 – 2022-12-22 (×3): 500 mg via INTRAVENOUS
  Filled 2022-12-20 (×3): qty 5

## 2022-12-20 MED ORDER — IPRATROPIUM-ALBUTEROL 0.5-2.5 (3) MG/3ML IN SOLN
3.0000 mL | Freq: Three times a day (TID) | RESPIRATORY_TRACT | Status: DC
  Administered 2022-12-20 – 2022-12-21 (×4): 3 mL via RESPIRATORY_TRACT
  Filled 2022-12-20 (×5): qty 3

## 2022-12-20 MED ORDER — SODIUM CHLORIDE 0.9 % IV SOLN
3.0000 g | Freq: Four times a day (QID) | INTRAVENOUS | Status: DC
  Administered 2022-12-20 – 2022-12-24 (×16): 3 g via INTRAVENOUS
  Filled 2022-12-20 (×19): qty 8

## 2022-12-20 MED ORDER — IPRATROPIUM-ALBUTEROL 0.5-2.5 (3) MG/3ML IN SOLN
3.0000 mL | Freq: Four times a day (QID) | RESPIRATORY_TRACT | Status: DC
  Administered 2022-12-20: 3 mL via RESPIRATORY_TRACT
  Filled 2022-12-20: qty 3

## 2022-12-20 MED ORDER — THIAMINE HCL 100 MG/ML IJ SOLN
INTRAMUSCULAR | Status: AC
  Filled 2022-12-20: qty 6

## 2022-12-20 NOTE — Progress Notes (Deleted)
CRITICAL VALUE STICKER  CRITICAL VALUE: CBC, WBC: 0.5, hb :6.5, platelets 8, Neutrophills 0.2   RECEIVER (on-site recipient of call): Lockie Mola, RN  DATE & TIME NOTIFIED:   MESSENGER (representative from lab):  MD NOTIFIED: Dr. Carren Rang  TIME OF NOTIFICATION:2:36 AM  RESPONSE:  Awaiting

## 2022-12-20 NOTE — Progress Notes (Signed)
Pharmacy Antibiotic Note  Jamie Hakala is a 57 y.o. female admitted on 12/16/2022 with aspiration pneumonia.  Pharmacy has been consulted for Unasyn dosing.  Plan: Unasyn 3gm IV q6h F/U cxs and clinical progress Monitor V/S, labs  Height: 5\' 4"  (162.6 cm) Weight: 93.2 kg (205 lb 7.5 oz) IBW/kg (Calculated) : 54.7  Temp (24hrs), Avg:98.4 F (36.9 C), Min:97.8 F (36.6 C), Max:99 F (37.2 C)  Recent Labs  Lab 12/16/22 1853 12/17/22 0452 12/18/22 0445 12/19/22 0316 12/20/22 0448  WBC 8.4 8.7  --   --  9.1  CREATININE 0.90 0.76 0.74 0.61 0.67    Estimated Creatinine Clearance: 85.9 mL/min (by C-G formula based on SCr of 0.67 mg/dL).    Allergies  Allergen Reactions   Sulfa Antibiotics Nausea Only   Wellbutrin [Bupropion]     HEART PAPITATIONS   Pantoprazole Rash    Antimicrobials this admission: unasyn 5/26 >>  Microbiology results: 5/22 MRSA PCR: neg  Thank you for allowing pharmacy to be a part of this patient's care.  Elder Cyphers, BS Pharm D, BCPS Clinical Pharmacist 12/20/2022 1:22 PM

## 2022-12-20 NOTE — Progress Notes (Signed)
PROGRESS NOTE   Chelsea Gross  JYN:[id number] DOB: Oct 15, 1965 DOA: 12/16/2022 PCP: Raliegh Ip, DO   Chief Complaint  Patient presents with   Drug Overdose    SI   Level of care: ICU  Brief Admission History:  57 y.o. female with medical history significant of depression, anxiety, GERD, hyperlipidemia, hypothyroidism, suicidal behavior with attempted self-injury, and more presents the ED with a chief complaint of overdose.  Is reported that she got in a fight with her husband.  She was quite upset about it so she talked to the children.  She then took 50 Xanax pills.  She then called a different family member and told them what she had done.  EMS was called.  No further complaints could be discussed at this time due to patient's altered mental status. Pt has been combattive at times, yelling out and striking out at staff.    Assessment and Plan:  Intentional Benzodiazepine Overdose - Seems to be intentional overdose as patient reportedly took about 50 pills of her spouse's Xanax (alprazolam) - She will need TTS consult when she is medically cleared - Continue fluids and continue to monitor - continue stepdown care, have been discussing case with PCCM in [place], discussed case again on 5/25 with Dr. Wynona Neat  Acute metabolic encephalopathy - Confused and not answering questions appropriately - Due Xanax overdose - Pt remains encephalopathic, now requiring safety sitter and restraints for staff/pt safety; added IV haloperidol for severe agitation 5/24  -- 5/25 severe agitation persists, discussed with PCCM Dr. Wynona Neat and recommendation was to start IV precedex infusion titrated,continue for 24 hours then start to wean, this is being done to prevent patient from injuring herself or others  -- remains sedated on precedex infusion, will slowly try to start weaning in next 24 hours   Hypothyroid - Continue Synthroid when able to take p.o. again  GERD (gastroesophageal  reflux disease) - Continue PPI (changed to IV as pt not able to take p.o. at this time)  Generalized anxiety disorder - Continue Cymbalta - Holding Xanax in the setting of Xanax overdose (reportedly is not prescribed this but was taking the husband's prescription)  Acute upper airway congestion  -- concerned about aspiration  -- check portable CXR  -- start scheduled Duonebs   Hyperlipidemia - Continue statin when able to take oral   Depression - Intentional suicide attempt - Continue Cymbalta - Patient will need psych consult when medically cleared  - One-on-one safety sitter at bedside   DVT prophylaxis: [place] heparin  Code Status: F Family Communication: discussed case in depth with both sisters at bedside 5/25  Disposition: Status is: Inpatient Remains inpatient appropriate because: intensity    Consultants:  PCCM - discussed case with Dr. Wynona Neat 5/25 Procedures:   Antimicrobials:    Subjective: Pt sedated on precedex infusion    Objective: Vitals:   12/20/22 0600 12/20/22 0700 12/20/22 0900 12/20/22 1000  BP: (!) 116/49 (!) 118/55 (!) 121/53 119/67  Pulse: 65 (!) 59 63 79  Resp: (!) 22 (!) 25 (!) 23 17  Temp:      TempSrc:      SpO2: (!) 87% 97% 98% 96%  Weight:      Height:        Intake/Output Summary (Last 24 hours) at 12/20/2022 1104 Last data filed at 12/20/2022 0854 Gross per 24 hour  Intake 2349.63 ml  Output 1800 ml  Net 549.63 ml   Filed Weights   12/17/22 0500 12/18/22  0500 12/20/22 0500  Weight: 91.3 kg 91.3 kg 93.2 kg   Examination:  General exam: remains obtunded sedated on precedex infusion, coarse upper airway noises heard.  Respiratory system: coarse upper airway congestion noises heard, bilateral breath sounds heard. Mild increased WOB.  Cardiovascular system: normal S1 & S2 heard. No JVD, murmurs, rubs, gallops or clicks. No pedal edema. Gastrointestinal system: Abdomen is nondistended, soft and nontender. No organomegaly or masses  felt. Normal bowel sounds heard. Central nervous system: Alert and oriented. No focal neurological deficits. Extremities: Symmetric 5 x 5 power. Skin: No rashes, lesions or ulcers. Psychiatry: Judgement and insight appear normal. Mood & affect appropriate.   Data Reviewed: I have personally reviewed following labs and imaging studies  CBC: Recent Labs  Lab 12/16/22 1853 12/17/22 0452 12/20/22 0448  WBC 8.4 8.7 9.1  NEUTROABS 4.8 4.6 6.3  HGB 12.6 12.4 12.9  HCT 38.5 38.5 39.1  MCV 94.8 95.5 93.8  PLT 290 299 258    Basic Metabolic Panel: Recent Labs  Lab 12/16/22 1853 12/17/22 0452 12/18/22 0445 12/19/22 0316 12/20/22 0448  NA 137 137 137 137 136  K 3.6 4.6 3.4* 4.0 3.8  CL 104 105 107 103 106  CO2 26 26 22  21* 18*  GLUCOSE 103* 102* 97 96 166*  BUN 11 11 14 9 7   CREATININE 0.90 0.76 0.74 0.61 0.67  CALCIUM 8.9 8.7* 8.4* 8.8* 8.2*  MG  --  2.1 1.9  --  1.9    CBG: Recent Labs  Lab 12/19/22 2005 12/19/22 2022 12/20/22 0016 12/20/22 0416 12/20/22 0720  GLUCAP 123* 126* 162* 176* 153*    Recent Results (from the past 240 hour(s))  MRSA Next Gen by PCR, Nasal     Status: None   Collection Time: 12/16/22 10:23 PM   Specimen: Nasal Mucosa; Nasal Swab  Result Value Ref Range Status   MRSA by PCR Next Gen NOT DETECTED NOT DETECTED Final    Comment: (NOTE) The GeneXpert MRSA Assay (FDA approved for NASAL specimens only), is one component of a comprehensive MRSA colonization surveillance program. It is not intended to diagnose MRSA infection nor to guide or monitor treatment for MRSA infections. Test performance is not FDA approved in patients less than 80 years old. Performed at [facility], [address], [place], [place] [address]      Radiology Studies: No results found.  Scheduled Meds:  atorvastatin  40 mg Oral Daily   Chlorhexidine Gluconate Cloth  6 each Topical Daily   DULoxetine  60 mg Oral Daily   heparin  5,000 Units Subcutaneous Q8H    ipratropium-albuterol  3 mL Nebulization Q6H   levothyroxine  25 mcg Oral QAC breakfast   metoprolol tartrate  2.5 mg Intravenous Q6H   Continuous Infusions:  dexmedetomidine (PRECEDEX) IV infusion 0.6 mcg/kg/hr (12/20/22 0854)   dextrose 5 % and 0.9 % NaCl 140 mL/hr at 12/20/22 0647   famotidine (PEPCID) IV Stopped (12/19/22 2039)     LOS: 3 days   Critical Care Procedure Note Authorized and Performed by: Maryln Manuel MD  Total Critical Care time:  58 mins Due to a high probability of clinically significant, life threatening deterioration, the patient required my highest level of preparedness to intervene emergently and I personally spent this critical care time directly and personally managing the patient.  This critical care time included obtaining a history; examining the patient, pulse oximetry; ordering and review of studies; arranging urgent treatment with development of a management plan;  evaluation of patient's response of treatment; frequent reassessment; and discussions with other providers.  This critical care time was performed to assess and manage the high probability of imminent and life threatening deterioration that could result in multi-organ failure.  It was exclusive of separately billable procedures and treating other patients and teaching time.    Standley Dakins, MD How to contact the [facility] Attending or Consulting provider 7A - 7P or covering provider during after hours 7P -7A, for this patient?  Check the care team in [facility] and look for a) attending/consulting TRH provider listed and b) the [facility] team listed Log into [email or web address] and use [place]'s universal password to access. If you do not have the password, please contact the hospital operator. Locate the [facility] provider you are looking for under [facility] and page to a number that you can be directly reached. If you still have difficulty reaching the provider, please page the [facility] (Director on Call) for the  Hospitalists listed on amion for assistance.  12/20/2022, 11:04 AM

## 2022-12-21 ENCOUNTER — Inpatient Hospital Stay (HOSPITAL_COMMUNITY): Payer: 59

## 2022-12-21 DIAGNOSIS — G9341 Metabolic encephalopathy: Secondary | ICD-10-CM | POA: Diagnosis not present

## 2022-12-21 DIAGNOSIS — T424X2A Poisoning by benzodiazepines, intentional self-harm, initial encounter: Secondary | ICD-10-CM | POA: Diagnosis not present

## 2022-12-21 DIAGNOSIS — E039 Hypothyroidism, unspecified: Secondary | ICD-10-CM | POA: Diagnosis not present

## 2022-12-21 LAB — GLUCOSE, CAPILLARY
Glucose-Capillary: 103 mg/dL — ABNORMAL HIGH (ref 70–99)
Glucose-Capillary: 105 mg/dL — ABNORMAL HIGH (ref 70–99)
Glucose-Capillary: 107 mg/dL — ABNORMAL HIGH (ref 70–99)
Glucose-Capillary: 128 mg/dL — ABNORMAL HIGH (ref 70–99)
Glucose-Capillary: 129 mg/dL — ABNORMAL HIGH (ref 70–99)
Glucose-Capillary: 175 mg/dL — ABNORMAL HIGH (ref 70–99)

## 2022-12-21 LAB — CBC WITH DIFFERENTIAL/PLATELET
Abs Immature Granulocytes: 0.06 10*3/uL (ref 0.00–0.07)
Basophils Absolute: 0 10*3/uL (ref 0.0–0.1)
Basophils Relative: 0 %
Eosinophils Absolute: 0.2 10*3/uL (ref 0.0–0.5)
Eosinophils Relative: 2 %
HCT: 32.6 % — ABNORMAL LOW (ref 36.0–46.0)
Hemoglobin: 11.4 g/dL — ABNORMAL LOW (ref 12.0–15.0)
Immature Granulocytes: 1 %
Lymphocytes Relative: 17 %
Lymphs Abs: 1.6 10*3/uL (ref 0.7–4.0)
MCH: 31.8 pg (ref 26.0–34.0)
MCHC: 35 g/dL (ref 30.0–36.0)
MCV: 90.8 fL (ref 80.0–100.0)
Monocytes Absolute: 0.7 10*3/uL (ref 0.1–1.0)
Monocytes Relative: 7 %
Neutro Abs: 7.2 10*3/uL (ref 1.7–7.7)
Neutrophils Relative %: 73 %
Platelets: 247 10*3/uL (ref 150–400)
RBC: 3.59 MIL/uL — ABNORMAL LOW (ref 3.87–5.11)
RDW: 12.9 % (ref 11.5–15.5)
WBC: 9.8 10*3/uL (ref 4.0–10.5)
nRBC: 0 % (ref 0.0–0.2)

## 2022-12-21 LAB — COMPREHENSIVE METABOLIC PANEL
ALT: 30 U/L (ref 0–44)
AST: 28 U/L (ref 15–41)
Albumin: 2.8 g/dL — ABNORMAL LOW (ref 3.5–5.0)
Alkaline Phosphatase: 76 U/L (ref 38–126)
Anion gap: 13 (ref 5–15)
BUN: 6 mg/dL (ref 6–20)
CO2: 18 mmol/L — ABNORMAL LOW (ref 22–32)
Calcium: 8 mg/dL — ABNORMAL LOW (ref 8.9–10.3)
Chloride: 106 mmol/L (ref 98–111)
Creatinine, Ser: 0.58 mg/dL (ref 0.44–1.00)
GFR, Estimated: >60 mL/min (ref >60)
Glucose, Bld: 150 mg/dL — ABNORMAL HIGH (ref 70–99)
Potassium: 3 mmol/L — ABNORMAL LOW (ref 3.5–5.1)
Sodium: 137 mmol/L (ref 135–145)
Total Bilirubin: 1.1 mg/dL (ref 0.3–1.2)
Total Protein: 6.2 g/dL — ABNORMAL LOW (ref 6.5–8.1)

## 2022-12-21 LAB — FOLATE: Folate: 5.9 ng/mL — ABNORMAL LOW (ref >5.9)

## 2022-12-21 LAB — VITAMIN B12: Vitamin B-12: 193 pg/mL (ref 180–914)

## 2022-12-21 LAB — SEDIMENTATION RATE: Sed Rate: 61 mm/hr — ABNORMAL HIGH (ref 0–22)

## 2022-12-21 MED ORDER — SODIUM BICARBONATE 8.4 % IV SOLN
INTRAVENOUS | Status: DC
  Filled 2022-12-21 (×6): qty 1000

## 2022-12-21 MED ORDER — HALOPERIDOL LACTATE 5 MG/ML IJ SOLN
5.0000 mg | Freq: Once | INTRAMUSCULAR | Status: AC
  Administered 2022-12-21: 5 mg via INTRAVENOUS

## 2022-12-21 MED ORDER — POTASSIUM CHLORIDE 10 MEQ/100ML IV SOLN
10.0000 meq | INTRAVENOUS | Status: AC
  Administered 2022-12-21 (×4): 10 meq via INTRAVENOUS
  Filled 2022-12-21 (×4): qty 100

## 2022-12-21 NOTE — Progress Notes (Addendum)
PROGRESS NOTE   Chelsea Gross  ZOX:[id number] DOB: [birth year] DOA: 12/16/2022 PCP: Raliegh Ip, DO   Chief Complaint  Patient presents with   Drug Overdose    SI   Level of care: ICU  Brief Admission History:  57 y.o. female with medical history significant of depression, anxiety, GERD, hyperlipidemia, hypothyroidism, suicidal behavior with attempted self-injury, and more presents the ED with a chief complaint of overdose.  Is reported that she got in a fight with her husband.  She was quite upset about it so she talked to the children.  She then took 50 Xanax pills.  She then called a different family member and told them what she had done.  EMS was called.  No further complaints could be discussed at this time due to patient's altered mental status. Pt has been combattive at times, yelling out and striking out at staff.    Assessment and Plan:  Intentional Benzodiazepine Overdose - Seems to be intentional overdose as patient reportedly took about 50-80 pills of her spouse's Xanax (alprazolam) - She will need TTS consult when she is medically cleared - Continue fluids and continue to monitor - continue ICU care, have been discussing case with PCCM in [place], discussed case again on 5/25 with Dr. Wynona Neat  Acute metabolic encephalopathy - Confused and not answering questions appropriately - Due to intentional high dose Xanax overdose (50-80 tablets reportedly taken) - Pt remains encephalopathic, now requiring safety sitter and restraints for staff/pt safety; added IV haloperidol for severe agitation 5/24  -- 5/25 severe agitation persists, discussed with PCCM Dr. Wynona Neat and recommendation was to start IV precedex infusion titrated,continue for 24 hours then start to wean, this is being done to prevent patient from injuring herself or others  -- remains sedated on precedex infusion, we have been unable to wean due to severe delirium -- CT head without contrast ordered   -- IV thiamine 500 mg daily x 3 ordered  -- EEG ordered   Aspiration Pneumonia  -- seen on CXR -- started ampicillin sulbactam - plan to treat for at least 3 days -- continue Duoneb bronchodilator treatments  Normal Anion Gap Metabolic Acidosis -- changed IV fluid to sodium bicarbonate/dextrose isotonic infusion -- recheck metabolic panel in AM   Hypokalemia -- IV replacement ordered, recheck Mg in AM  -- BMP in AM    Hypothyroid - Continue Synthroid when able to take p.o. again, IV dose to be given 1 week after last oral dose  GERD (gastroesophageal reflux disease) - Continue PPI (changed to IV as pt not able to take p.o. at this time)  Generalized anxiety disorder - Continue Cymbalta - Holding Xanax in the setting of Xanax overdose (reportedly is not prescribed this but was taking the husband's prescription)  Hyperlipidemia - Continue statin when able to take oral   Depression - Intentional suicide attempt - Continue Cymbalta - Patient will need psych consult when medically cleared  - One-on-one safety sitter at bedside   DVT prophylaxis: Notus heparin  Code Status: Full Family Communication: discussed case in depth with both sisters at bedside 5/25  Disposition: Status is: Inpatient Remains inpatient appropriate because: intensity    Consultants:  PCCM - discussed case with Dr. Wynona Neat 5/25 Procedures:   Antimicrobials:  Ampicillin/sulbactam 5/26>>    Subjective: Pt remains severely delirious, requiring higher doses of precedex and haldol PRN    Objective: Vitals:   12/21/22 0600 12/21/22 0715 12/21/22 0751 12/21/22 0800  BP: (!) 125/59   Marland Kitchen)  107/46  Pulse: 71  71 70  Resp: 20  19 18   Temp:   98.7 F (37.1 C)   TempSrc:   Axillary   SpO2: 92% 93% 91% 96%  Weight:      Height:        Intake/Output Summary (Last 24 hours) at 12/21/2022 1014 Last data filed at 12/21/2022 1610 Gross per 24 hour  Intake 4325.54 ml  Output 400 ml  Net 3925.54 ml    Filed Weights   12/17/22 0500 12/18/22 0500 12/20/22 0500  Weight: 91.3 kg 91.3 kg 93.2 kg   Examination:  General exam: remains obtunded sedated on precedex infusion, coarse upper airway noises heard.  Respiratory system: coarse upper airway congestion noises heard, bilateral breath sounds heard. Mild increased WOB.  Cardiovascular system: normal S1 & S2 heard. No JVD, murmurs, rubs, gallops or clicks. No pedal edema. Gastrointestinal system: Abdomen is nondistended, soft and nontender. No organomegaly or masses felt. Normal bowel sounds heard. Central nervous system: sedated.  No focal neurological deficits. Extremities: Symmetric 5 x 5 power. Skin: No rashes, lesions or ulcers. Psychiatry: Judgement and insight appear poor. Mood & affect UTD.   Data Reviewed: I have personally reviewed following labs and imaging studies  CBC: Recent Labs  Lab 12/16/22 1853 12/17/22 0452 12/20/22 0448 12/21/22 0441  WBC 8.4 8.7 9.1 9.8  NEUTROABS 4.8 4.6 6.3 7.2  HGB 12.6 12.4 12.9 11.4*  HCT 38.5 38.5 39.1 32.6*  MCV 94.8 95.5 93.8 90.8  PLT 290 299 258 247    Basic Metabolic Panel: Recent Labs  Lab 12/17/22 0452 12/18/22 0445 12/19/22 0316 12/20/22 0448 12/21/22 0441  NA 137 137 137 136 137  K 4.6 3.4* 4.0 3.8 3.0*  CL 105 107 103 106 106  CO2 26 22 21* 18* 18*  GLUCOSE 102* 97 96 166* 150*  BUN 11 14 9 7 6   CREATININE 0.76 0.74 0.61 0.67 0.58  CALCIUM 8.7* 8.4* 8.8* 8.2* 8.0*  MG 2.1 1.9  --  1.9  --     CBG: Recent Labs  Lab 12/20/22 1602 12/20/22 2031 12/21/22 0016 12/21/22 0416 12/21/22 0753  GLUCAP 154* 159* 107* 103* 175*    Recent Results (from the past 240 hour(s))  MRSA Next Gen by PCR, Nasal     Status: None   Collection Time: 12/16/22 10:23 PM   Specimen: Nasal Mucosa; Nasal Swab  Result Value Ref Range Status   MRSA by PCR Next Gen NOT DETECTED NOT DETECTED Final    Comment: (NOTE) The GeneXpert MRSA Assay (FDA approved for NASAL specimens  only), is one component of a comprehensive MRSA colonization surveillance program. It is not intended to diagnose MRSA infection nor to guide or monitor treatment for MRSA infections. Test performance is not FDA approved in patients less than 54 years old. Performed at [facility], [address], [place], [place] [address]      Radiology Studies: DG CHEST PORT 1 VIEW  Result Date: 12/20/2022 CLINICAL DATA:  Chest congestion. Chest rales. EXAM: PORTABLE CHEST 1 VIEW COMPARISON:  Chest CT 07/07/2019, no interval imaging available FINDINGS: Low lung volumes. There is mild patient rotation. The heart is mildly enlarged, likely accentuated by portable AP technique. There are patchy opacities in the right perihilar and infrahilar region. No significant pleural effusion. No pneumothorax. IMPRESSION: 1. Patchy right perihilar and infrahilar opacities, suspicious for pneumonia. 2. Mild cardiomegaly likely accentuated by portable AP technique. Electronically Signed   By: Ivette Loyal.D.  On: 12/20/2022 12:32    Scheduled Meds:  atorvastatin  40 mg Oral Daily   Chlorhexidine Gluconate Cloth  6 each Topical Daily   DULoxetine  60 mg Oral Daily   heparin  5,000 Units Subcutaneous Q8H   ipratropium-albuterol  3 mL Nebulization TID   levothyroxine  25 mcg Oral QAC breakfast   metoprolol tartrate  2.5 mg Intravenous Q6H   Continuous Infusions:  ampicillin-sulbactam (UNASYN) IV 3 g (12/21/22 0808)   dexmedetomidine (PRECEDEX) IV infusion 1 mcg/kg/hr (12/21/22 0910)   famotidine (PEPCID) IV 20 mg (12/20/22 2058)   potassium chloride 10 mEq (12/21/22 0929)   sodium bicarbonate 150 mEq in dextrose 5 % 1,150 mL infusion 125 mL/hr at 12/21/22 0909   thiamine (VITAMIN B1) injection 500 mg (12/20/22 2138)     LOS: 4 days   Critical Care Procedure Note Authorized and Performed by: Maryln Manuel MD  Total Critical Care time:  45 mins Due to a high probability of clinically significant, life  threatening deterioration, the patient required my highest level of preparedness to intervene emergently and I personally spent this critical care time directly and personally managing the patient.  This critical care time included obtaining a history; examining the patient, pulse oximetry; ordering and review of studies; arranging urgent treatment with development of a management plan; evaluation of patient's response of treatment; frequent reassessment; and discussions with other providers.  This critical care time was performed to assess and manage the high probability of imminent and life threatening deterioration that could result in multi-organ failure.  It was exclusive of separately billable procedures and treating other patients and teaching time.    Standley Dakins, MD How to contact the [facility] Attending or Consulting provider 7A - 7P or covering provider during after hours 7P -7A, for this patient?  Check the care team in [facility] and look for a) attending/consulting TRH provider listed and b) the [facility] team listed Log into [email or web address] and use [facility]'s universal password to access. If you do not have the password, please contact the hospital operator. Locate the [facility] provider you are looking for under [facility] and page to a number that you can be directly reached. If you still have difficulty reaching the provider, please page the [facility] (Director on Call) for the Hospitalists listed on amion for assistance.  12/21/2022, 10:14 AM

## 2022-12-22 ENCOUNTER — Inpatient Hospital Stay (HOSPITAL_COMMUNITY)
Admit: 2022-12-22 | Discharge: 2022-12-22 | Disposition: A | Discharge status: Home / Self Care | Payer: 59 | Attending: Family Medicine | Admitting: Family Medicine

## 2022-12-22 DIAGNOSIS — R569 Unspecified convulsions: Secondary | ICD-10-CM | POA: Diagnosis not present

## 2022-12-22 DIAGNOSIS — T424X2A Poisoning by benzodiazepines, intentional self-harm, initial encounter: Secondary | ICD-10-CM | POA: Diagnosis not present

## 2022-12-22 DIAGNOSIS — F13931 Sedative, hypnotic or anxiolytic use, unspecified with withdrawal delirium: Secondary | ICD-10-CM | POA: Diagnosis not present

## 2022-12-22 DIAGNOSIS — E039 Hypothyroidism, unspecified: Secondary | ICD-10-CM | POA: Diagnosis not present

## 2022-12-22 DIAGNOSIS — F411 Generalized anxiety disorder: Secondary | ICD-10-CM | POA: Diagnosis not present

## 2022-12-22 DIAGNOSIS — R4182 Altered mental status, unspecified: Secondary | ICD-10-CM | POA: Diagnosis not present

## 2022-12-22 LAB — COMPREHENSIVE METABOLIC PANEL
ALT: 34 U/L (ref 0–44)
AST: 25 U/L (ref 15–41)
Albumin: 2.7 g/dL — ABNORMAL LOW (ref 3.5–5.0)
Alkaline Phosphatase: 71 U/L (ref 38–126)
Anion gap: 13 (ref 5–15)
BUN: <5 mg/dL — ABNORMAL LOW (ref 6–20)
CO2: 22 mmol/L (ref 22–32)
Calcium: 8.2 mg/dL — ABNORMAL LOW (ref 8.9–10.3)
Chloride: 102 mmol/L (ref 98–111)
Creatinine, Ser: 0.6 mg/dL (ref 0.44–1.00)
GFR, Estimated: >60 mL/min (ref >60)
Glucose, Bld: 139 mg/dL — ABNORMAL HIGH (ref 70–99)
Potassium: 2.8 mmol/L — ABNORMAL LOW (ref 3.5–5.1)
Sodium: 137 mmol/L (ref 135–145)
Total Bilirubin: 1 mg/dL (ref 0.3–1.2)
Total Protein: 6.1 g/dL — ABNORMAL LOW (ref 6.5–8.1)

## 2022-12-22 LAB — CBC WITH DIFFERENTIAL/PLATELET
Abs Immature Granulocytes: 0.04 10*3/uL (ref 0.00–0.07)
Basophils Absolute: 0 10*3/uL (ref 0.0–0.1)
Basophils Relative: 0 %
Eosinophils Absolute: 0.2 10*3/uL (ref 0.0–0.5)
Eosinophils Relative: 2 %
HCT: 31.1 % — ABNORMAL LOW (ref 36.0–46.0)
Hemoglobin: 10.9 g/dL — ABNORMAL LOW (ref 12.0–15.0)
Immature Granulocytes: 1 %
Lymphocytes Relative: 18 %
Lymphs Abs: 1.5 10*3/uL (ref 0.7–4.0)
MCH: 31.3 pg (ref 26.0–34.0)
MCHC: 35 g/dL (ref 30.0–36.0)
MCV: 89.4 fL (ref 80.0–100.0)
Monocytes Absolute: 0.6 10*3/uL (ref 0.1–1.0)
Monocytes Relative: 8 %
Neutro Abs: 5.9 10*3/uL (ref 1.7–7.7)
Neutrophils Relative %: 71 %
Platelets: 262 10*3/uL (ref 150–400)
RBC: 3.48 MIL/uL — ABNORMAL LOW (ref 3.87–5.11)
RDW: 12.9 % (ref 11.5–15.5)
WBC: 8.2 10*3/uL (ref 4.0–10.5)
nRBC: 0 % (ref 0.0–0.2)

## 2022-12-22 LAB — GLUCOSE, CAPILLARY
Glucose-Capillary: 101 mg/dL — ABNORMAL HIGH (ref 70–99)
Glucose-Capillary: 128 mg/dL — ABNORMAL HIGH (ref 70–99)
Glucose-Capillary: 133 mg/dL — ABNORMAL HIGH (ref 70–99)
Glucose-Capillary: 136 mg/dL — ABNORMAL HIGH (ref 70–99)
Glucose-Capillary: 87 mg/dL (ref 70–99)

## 2022-12-22 LAB — MAGNESIUM: Magnesium: 1.6 mg/dL — ABNORMAL LOW (ref 1.7–2.4)

## 2022-12-22 LAB — RPR: RPR Ser Ql: NONREACTIVE

## 2022-12-22 MED ORDER — THIAMINE HCL 100 MG/ML IJ SOLN
INTRAMUSCULAR | Status: AC
  Filled 2022-12-22: qty 6

## 2022-12-22 MED ORDER — METOPROLOL SUCCINATE ER 25 MG PO TB24
12.5000 mg | ORAL_TABLET | Freq: Every day | ORAL | Status: DC
  Administered 2022-12-23 – 2022-12-25 (×3): 12.5 mg via ORAL
  Filled 2022-12-22 (×4): qty 1

## 2022-12-22 MED ORDER — FAMOTIDINE 20 MG PO TABS
20.0000 mg | ORAL_TABLET | Freq: Every day | ORAL | Status: DC
  Administered 2022-12-22 – 2022-12-24 (×3): 20 mg via ORAL
  Filled 2022-12-22 (×3): qty 1

## 2022-12-22 MED ORDER — MAGNESIUM SULFATE 4 GM/100ML IV SOLN
4.0000 g | Freq: Once | INTRAVENOUS | Status: AC
  Administered 2022-12-22: 4 g via INTRAVENOUS
  Filled 2022-12-22: qty 100

## 2022-12-22 MED ORDER — POTASSIUM CHLORIDE 10 MEQ/100ML IV SOLN
10.0000 meq | INTRAVENOUS | Status: AC
  Administered 2022-12-22 (×4): 10 meq via INTRAVENOUS
  Filled 2022-12-22 (×2): qty 100

## 2022-12-22 MED ORDER — SODIUM CHLORIDE 0.9 % IV SOLN: 1.0000 mg | Freq: Every day | INTRAVENOUS | Status: DC

## 2022-12-22 MED ORDER — FOLIC ACID 5 MG/ML IJ SOLN
1.0000 mg | Freq: Every day | INTRAMUSCULAR | Status: AC
  Administered 2022-12-22 – 2022-12-23 (×2): 1 mg via INTRAVENOUS
  Filled 2022-12-22 (×2): qty 0.2

## 2022-12-22 MED ORDER — KCL IN DEXTROSE-NACL 20-5-0.9 MEQ/L-%-% IV SOLN: INTRAVENOUS | Status: DC

## 2022-12-22 MED ORDER — IPRATROPIUM-ALBUTEROL 0.5-2.5 (3) MG/3ML IN SOLN
3.0000 mL | RESPIRATORY_TRACT | Status: DC | PRN
  Administered 2022-12-22: 3 mL via RESPIRATORY_TRACT
  Filled 2022-12-22: qty 3

## 2022-12-22 NOTE — Procedures (Signed)
Patient Name: Chelsea Gross  MRN: [id number]  Epilepsy Attending: Charlsie Quest  Referring Physician/Provider: Cleora Fleet, MD Date: 12/22/2022 Duration: 23.23 mins  Patient history: 57yo F with intentional benzo overdose and now persistent encephalopathy. EEG to evaluate for seizure.  Level of alertness: Awake  AEDs during EEG study: None  Technical aspects: This EEG study was done with scalp electrodes positioned according to the 10-20 International system of electrode placement. Electrical activity was reviewed with band pass filter of 1-70Hz , sensitivity of 7 uV/mm, display speed of 27mm/sec with a 60Hz  notched filter applied as appropriate. EEG data were recorded continuously and digitally stored.  Video monitoring was available and reviewed as appropriate.  Description: The posterior dominant rhythm consists of 7.5 Hz activity of moderate voltage (25-35 uV) seen predominantly in posterior head regions, symmetric and reactive to eye opening and eye closing. EEG showed continuous generalized 5 to 7 Hz theta slowing. Hyperventilation and photic stimulation were not performed.     ABNORMALITY - Continuous slow, generalized  IMPRESSION: This study is suggestive of mild to moderate diffuse encephalopathy, nonspecific etiology. No seizures or epileptiform discharges were seen throughout the recording.  Rizwan Kuyper Annabelle Harman

## 2022-12-22 NOTE — Progress Notes (Signed)
EEG complete - results pending 

## 2022-12-22 NOTE — Progress Notes (Addendum)
PROGRESS NOTE   Chelsea Gross  ZOX:[id number] DOB: Nov 01, 1965 DOA: 12/16/2022 PCP: Raliegh Ip, DO   Chief Complaint  Patient presents with   Drug Overdose    SI   Level of care: ICU  Brief Admission History:  57 y.o. female with medical history significant of depression, anxiety, GERD, hyperlipidemia, hypothyroidism, suicidal behavior with attempted self-injury, and more presents the ED with a chief complaint of overdose.  Is reported that she got in a fight with her husband.  She was quite upset about it so she talked to the children.  She then took 50 Xanax pills.  She then called a different family member and told them what she had done.  EMS was called.  No further complaints could be discussed at this time due to patient's altered mental status. Pt has been combattive at times, yelling out and striking out at staff.    Assessment and Plan:  Intentional Benzodiazepine Overdose - Seems to be intentional overdose as patient reportedly took about 50-80 pills of her spouse's Xanax (alprazolam) - She will need TTS consult when she is medically cleared - Continue fluids and continue to monitor - continue ICU care, have been discussing case with PCCM in [place], discussed case again on 5/25 with Dr. Wynona Neat  Acute metabolic encephalopathy - Confused and not answering questions appropriately - Due to intentional high dose Xanax overdose (50-80 tablets reportedly taken) - Pt remains encephalopathic, now requiring safety sitter and restraints for staff/pt safety; added IV haloperidol for severe agitation 5/24  -- 5/25 severe agitation persists, discussed with PCCM Dr. Wynona Neat and recommendation was to start IV precedex infusion titrated,continue for 24 hours then start to wean, this is being done to prevent patient from injuring herself or others  -- remains sedated on precedex infusion, we have been unable to wean due to severe delirium -- 5/28 slowly starting to interact,  speak, answer questions, eat ice chips and take sips, not eaten a meal yet -- CT head without contrast ordered - negative for acute findings -- IV thiamine 500 mg daily x 3 ordered  -- EEG ordered - awaiting for it to be done and resulted  Acute Benzodiazepine Withdrawal with delirium  - daughter reported that even though patient had not been prescribed alprazolam, she has been continuously on benzodiazepines for at least 25 years and she had been taking her husband's alprazolam prescription and she made me aware that patient had been hoarding a supply of alprazolam from her husband's prescription.   - daughter made me aware that patient was no longer being prescribed alprazolam at least the last year and a half because she had a positive drug screen for marijuana.  - patient is slowly improving on supportive measures, weaning off precedex infusion slowly as able  - consult to TTS when medically stabilized and cleared by medical team  Aspiration Pneumonia  -- seen on CXR -- started ampicillin sulbactam - plan to treat for at least 3-5 days -- continue Duoneb bronchodilator treatments  Normal Anion Gap Metabolic Acidosis -- changed IV fluid to sodium bicarbonate/dextrose isotonic infusion -- resolved; DC bicarb solution 5/28  Hypokalemia / Hypomagnesemia -- IV replacement ordered, recheck Mg in AM  -- BMP in AM    Hypothyroid - Continue Synthroid when able to take p.o. again, IV dose to be given 1 week after last oral dose  GERD (gastroesophageal reflux disease) - Continue PPI (changed to IV as pt not able to take p.o. at this time)  Generalized anxiety disorder / Benzodiazepine dependence  - Continue Cymbalta when able to take oral again - Holding Xanax in the setting of Xanax overdose (reportedly is not prescribed this but was taking the husband's prescription)  Hyperlipidemia - Continue statin when able to take oral   Depression - Intentional suicide attempt - Continue  Cymbalta - Patient will need psych consult when medically cleared  - One-on-one safety sitter at bedside   DVT prophylaxis: Bandana heparin  Code Status: Full Family Communication: discussed case in depth with both sisters at bedside 5/25, daughter updated bedside 5/28, telephone call to spouse 5/28, no answer Disposition: Status is: Inpatient Remains inpatient appropriate because: intensity    Consultants:  PCCM - discussed case with Dr. Wynona Neat 5/25 Procedures:   Antimicrobials:  Ampicillin/sulbactam 5/26>>   Subjective: Pt slowly becoming more interactive, for short periods, able to answer some questions and interact with daughter briefly, however, it is evident that she is in severe emotional dark place, severely depressed, crying   Objective: Vitals:   12/22/22 0800 12/22/22 1029 12/22/22 1031 12/22/22 1050  BP: 117/88 95/72 95/72    Pulse: 61 (!) 55 62   Resp: 17 11 12    Temp: 98.3 F (36.8 C)   98.2 F (36.8 C)  TempSrc: Oral   Oral  SpO2: 98% (!) 89% 96%   Weight:      Height:        Intake/Output Summary (Last 24 hours) at 12/22/2022 1103 Last data filed at 12/22/2022 0900 Gross per 24 hour  Intake 2824.72 ml  Output 2950 ml  Net -125.28 ml   Filed Weights   12/17/22 0500 12/18/22 0500 12/20/22 0500  Weight: 91.3 kg 91.3 kg 93.2 kg   Examination:  General exam: sedated but arousable, severely depressed;  Respiratory system: coarse upper airway congestion noises heard, bilateral breath sounds heard. Mild increased WOB.  Cardiovascular system: normal S1 & S2 heard. No JVD, murmurs, rubs, gallops or clicks. No pedal edema. Gastrointestinal system: Abdomen is nondistended, soft and nontender. No organomegaly or masses felt. Normal bowel sounds heard. Central nervous system: sedated.  No focal neurological deficits. Extremities: Symmetric 5 x 5 power. Skin: No rashes, lesions or ulcers. Psychiatry: Judgement and insight appear poor. Mood & affect UTD.   Data  Reviewed: I have personally reviewed following labs and imaging studies  CBC: Recent Labs  Lab 12/16/22 1853 12/17/22 0452 12/20/22 0448 12/21/22 0441 12/22/22 0405  WBC 8.4 8.7 9.1 9.8 8.2  NEUTROABS 4.8 4.6 6.3 7.2 5.9  HGB 12.6 12.4 12.9 11.4* 10.9*  HCT 38.5 38.5 39.1 32.6* 31.1*  MCV 94.8 95.5 93.8 90.8 89.4  PLT 290 299 258 247 262    Basic Metabolic Panel: Recent Labs  Lab 12/17/22 0452 12/18/22 0445 12/19/22 0316 12/20/22 0448 12/21/22 0441 12/22/22 0405  NA 137 137 137 136 137 137  K 4.6 3.4* 4.0 3.8 3.0* 2.8*  CL 105 107 103 106 106 102  CO2 26 22 21* 18* 18* 22  GLUCOSE 102* 97 96 166* 150* 139*  BUN 11 14 9 7 6  <5*  CREATININE 0.76 0.74 0.61 0.67 0.58 0.60  CALCIUM 8.7* 8.4* 8.8* 8.2* 8.0* 8.2*  MG 2.1 1.9  --  1.9  --  1.6*    CBG: Recent Labs  Lab 12/21/22 1610 12/21/22 2106 12/22/22 0010 12/22/22 0406 12/22/22 0820  GLUCAP 128* 105* 128* 133* 136*    Recent Results (from the past 240 hour(s))  MRSA Next Gen by PCR, Nasal  Status: None   Collection Time: 12/16/22 10:23 PM   Specimen: Nasal Mucosa; Nasal Swab  Result Value Ref Range Status   MRSA by PCR Next Gen NOT DETECTED NOT DETECTED Final    Comment: (NOTE) The GeneXpert MRSA Assay (FDA approved for NASAL specimens only), is one component of a comprehensive MRSA colonization surveillance program. It is not intended to diagnose MRSA infection nor to guide or monitor treatment for MRSA infections. Test performance is not FDA approved in patients less than 36 years old. Performed at [facility], [address], [place], [place] [address]      Radiology Studies: CT HEAD WO CONTRAST ( )  Result Date: 12/21/2022 CLINICAL DATA:  Overdose with mental status change. EXAM: CT HEAD WITHOUT CONTRAST TECHNIQUE: Contiguous axial images were obtained from the base of the skull through the vertex without intravenous contrast. RADIATION DOSE REDUCTION: This exam was performed according to the  departmental dose-optimization program which includes automated exposure control, adjustment of the mA and/or kV according to patient size and/or use of iterative reconstruction technique. COMPARISON:  None Available. FINDINGS: Brain: No evidence of acute infarction, hemorrhage, hydrocephalus, extra-axial collection or mass lesion/mass effect. Vascular: No hyperdense vessel or unexpected calcification. Skull: Normal. Negative for fracture or focal lesion. Sinuses/Orbits: No acute finding. IMPRESSION: Negative head CT. Electronically Signed   By: Tiburcio Pea M.D.   On: 12/21/2022 12:13    Scheduled Meds:  atorvastatin  40 mg Oral Daily   Chlorhexidine Gluconate Cloth  6 each Topical Daily   DULoxetine  60 mg Oral Daily   folic acid  1 mg Intravenous Daily   heparin  5,000 Units Subcutaneous Q8H   ipratropium-albuterol  3 mL Nebulization TID   levothyroxine  25 mcg Oral QAC breakfast   metoprolol tartrate  2.5 mg Intravenous Q6H   Continuous Infusions:  ampicillin-sulbactam (UNASYN) IV 3 g (12/22/22 1028)   dexmedetomidine (PRECEDEX) IV infusion 0.3 mcg/kg/hr (12/22/22 1029)   dextrose 5 % and 0.9 % NaCl with KCl 20 mEq/L 125 mL/hr at 12/22/22 1019   famotidine (PEPCID) IV Stopped (12/21/22 2000)   potassium chloride 10 mEq (12/22/22 1010)   thiamine (VITAMIN B1) injection 500 mg (12/21/22 1740)     LOS: 5 days   Critical Care Procedure Note Authorized and Performed by: Maryln Manuel MD  Total Critical Care time:  48 mins Due to a high probability of clinically significant, life threatening deterioration, the patient required my highest level of preparedness to intervene emergently and I personally spent this critical care time directly and personally managing the patient.  This critical care time included obtaining a history; examining the patient, pulse oximetry; ordering and review of studies; arranging urgent treatment with development of a management plan; evaluation of patient's  response of treatment; frequent reassessment; and discussions with other providers.  This critical care time was performed to assess and manage the high probability of imminent and life threatening deterioration that could result in multi-organ failure.  It was exclusive of separately billable procedures and treating other patients and teaching time.    Standley Dakins, MD How to contact the [facility] Attending or Consulting provider 7A - 7P or covering provider during after hours 7P -7A, for this patient?  Check the care team in [facility] and look for a) attending/consulting TRH provider listed and b) the [facility] team listed Log into [email or web address] and use [facility]'s universal password to access. If you do not have the password, please contact the hospital operator. Locate the [facility]  provider you are looking for under [facility] and page to a number that you can be directly reached. If you still have difficulty reaching the provider, please page the [facility] (Director on Call) for the Hospitalists listed on amion for assistance.  12/22/2022, 11:03 AM

## 2022-12-23 ENCOUNTER — Encounter (HOSPITAL_COMMUNITY): Payer: Self-pay | Admitting: Family Medicine

## 2022-12-23 DIAGNOSIS — T50902D Poisoning by unspecified drugs, medicaments and biological substances, intentional self-harm, subsequent encounter: Secondary | ICD-10-CM | POA: Diagnosis not present

## 2022-12-23 LAB — CBC WITH DIFFERENTIAL/PLATELET
Abs Immature Granulocytes: 0.08 10*3/uL — ABNORMAL HIGH (ref 0.00–0.07)
Basophils Absolute: 0 10*3/uL (ref 0.0–0.1)
Basophils Relative: 1 %
Eosinophils Absolute: 0.2 10*3/uL (ref 0.0–0.5)
Eosinophils Relative: 3 %
HCT: 36 % (ref 36.0–46.0)
Hemoglobin: 12.1 g/dL (ref 12.0–15.0)
Immature Granulocytes: 1 %
Lymphocytes Relative: 27 %
Lymphs Abs: 2.2 10*3/uL (ref 0.7–4.0)
MCH: 30.5 pg (ref 26.0–34.0)
MCHC: 33.6 g/dL (ref 30.0–36.0)
MCV: 90.7 fL (ref 80.0–100.0)
Monocytes Absolute: 0.7 10*3/uL (ref 0.1–1.0)
Monocytes Relative: 9 %
Neutro Abs: 4.7 10*3/uL (ref 1.7–7.7)
Neutrophils Relative %: 59 %
Platelets: 304 10*3/uL (ref 150–400)
RBC: 3.97 MIL/uL (ref 3.87–5.11)
RDW: 13.4 % (ref 11.5–15.5)
WBC: 7.9 10*3/uL (ref 4.0–10.5)
nRBC: 0 % (ref 0.0–0.2)

## 2022-12-23 LAB — COMPREHENSIVE METABOLIC PANEL
ALT: 30 U/L (ref 0–44)
AST: 18 U/L (ref 15–41)
Albumin: 3 g/dL — ABNORMAL LOW (ref 3.5–5.0)
Alkaline Phosphatase: 72 U/L (ref 38–126)
Anion gap: 14 (ref 5–15)
BUN: 5 mg/dL — ABNORMAL LOW (ref 6–20)
CO2: 18 mmol/L — ABNORMAL LOW (ref 22–32)
Calcium: 8.2 mg/dL — ABNORMAL LOW (ref 8.9–10.3)
Chloride: 107 mmol/L (ref 98–111)
Creatinine, Ser: 0.59 mg/dL (ref 0.44–1.00)
GFR, Estimated: >60 mL/min (ref >60)
Glucose, Bld: 113 mg/dL — ABNORMAL HIGH (ref 70–99)
Potassium: 3.1 mmol/L — ABNORMAL LOW (ref 3.5–5.1)
Sodium: 139 mmol/L (ref 135–145)
Total Bilirubin: 1.1 mg/dL (ref 0.3–1.2)
Total Protein: 6.6 g/dL (ref 6.5–8.1)

## 2022-12-23 LAB — GLUCOSE, CAPILLARY
Glucose-Capillary: 120 mg/dL — ABNORMAL HIGH (ref 70–99)
Glucose-Capillary: 127 mg/dL — ABNORMAL HIGH (ref 70–99)
Glucose-Capillary: 120 mg/dL — ABNORMAL HIGH (ref 70–99)
Glucose-Capillary: 111 mg/dL — ABNORMAL HIGH (ref 70–99)

## 2022-12-23 LAB — MAGNESIUM: Magnesium: 2 mg/dL (ref 1.7–2.4)

## 2022-12-23 MED ORDER — ENSURE ENLIVE PO LIQD
237.0000 mL | Freq: Two times a day (BID) | ORAL | Status: DC
  Administered 2022-12-23 – 2022-12-25 (×4): 237 mL via ORAL

## 2022-12-23 MED ORDER — SODIUM CHLORIDE 0.9 % IV BOLUS
500.0000 mL | Freq: Once | INTRAVENOUS | Status: AC
  Administered 2022-12-23: 500 mL via INTRAVENOUS

## 2022-12-23 MED ORDER — HYDRALAZINE HCL 20 MG/ML IJ SOLN: 10.0000 mg | INTRAMUSCULAR | Status: DC | PRN

## 2022-12-23 MED ORDER — POTASSIUM CHLORIDE CRYS ER 20 MEQ PO TBCR
40.0000 meq | EXTENDED_RELEASE_TABLET | Freq: Two times a day (BID) | ORAL | Status: AC
  Administered 2022-12-23 (×2): 40 meq via ORAL
  Filled 2022-12-23 (×2): qty 2

## 2022-12-23 MED ORDER — ENSURE ENLIVE PO LIQD: 237.0000 mL | Freq: Two times a day (BID) | ORAL | 12 refills | Status: DC

## 2022-12-23 MED ORDER — OCUVITE-LUTEIN PO CAPS
1.0000 | ORAL_CAPSULE | Freq: Every day | ORAL | Status: DC
  Administered 2022-12-23 – 2022-12-25 (×3): 1 via ORAL
  Filled 2022-12-23 (×3): qty 1
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
## 2022-12-23 NOTE — Progress Notes (Signed)
Initial Nutrition Assessment  DOCUMENTATION CODES:   Obesity unspecified  INTERVENTION:  Soft diet  Ensure Plus BID  Request weight   NUTRITION DIAGNOSIS:   Inadequate oral intake related to inability to eat as evidenced by NPO status (and pt not hungry once diet was advanced).   GOAL:  Patient will meet greater than or equal to 90% of their needs   MONITOR:  PO intake, Labs, Weight trends, Supplement acceptance  REASON FOR ASSESSMENT:   Consult Assessment of nutrition requirement/status  ASSESSMENT: Patient is an obese 57 yo female who presented from home on 5/22 with AMS and intentional overdose of xanax. Lives with husband.   Soft diet provided- intake 0-25% meals. NPO between 5/22->5/28 not appropriate for diet advancement. Safety sitter bedside. Patient is awake but sluggish and lunch is here. She is not hungry but was willing to drink a nutrition shake. Discussed with her the importance of nutrition as part of her recovery process. Encouraged her to drink ONS BID due to inadequate nutrition intake Day 6.   Stable weight history between 90-93 kg the past 2 years per chart. Most recent weight 5/22 was 93.2 kg. Will request current weight to assess for significant changes. Suspect acute malnutrition given her poor oral intake of </=50% for >/= 5 days.   Medications reviewed and include: Pepcid, Klor-con  Intake/Output Summary (Last 24 hours) at 12/23/2022 1301 Last data filed at 12/23/2022 1257 Gross per 24 hour  Intake 2352.42 ml  Output 2250 ml  Net 102.42 ml        Latest Ref Rng & Units 12/23/2022    5:12 AM 12/22/2022    4:05 AM 12/21/2022    4:41 AM  BMP  Glucose 70 - 99 mg/dL 161  096  045   BUN 6 - 20 mg/dL 5  <5  6   Creatinine 4.09 - 1.00 mg/dL 8.11  9.14  7.82   Sodium 135 - 145 mmol/L 139  137  137   Potassium 3.5 - 5.1 mmol/L 3.1  2.8  3.0   Chloride 98 - 111 mmol/L 107  102  106   CO2 22 - 32 mmol/L 18  22  18    Calcium 8.9 - 10.3 mg/dL 8.2  8.2   8.0       NUTRITION - FOCUSED PHYSICAL EXAM: NFPE conducted findings are no fat depletion, mild temporal muscle depletion and no edema.    Diet Order:   Diet Order             DIET SOFT Room service appropriate? Yes; Fluid consistency: Thin  Diet effective now                   EDUCATION NEEDS:  Education needs have been addressed  Skin:  Skin Assessment: Reviewed RN Assessment  Last BM:  unknown  Height:   Ht Readings from Last 1 Encounters:  12/16/22 5\' 4"  (1.626 m)    Weight:   Wt Readings from Last 1 Encounters:  12/20/22 93.2 kg    Ideal Body Weight:   54.55 kg  BMI:  Body mass index is 35.27 kg/m.  Estimated Nutritional Needs:   Kcal:  1700-1900  Protein:  89-95 gr  Fluid:  1.7-1.9 liters daily  Royann Shivers MS,RD,CSG,LDN Contact: Loretha Stapler

## 2022-12-23 NOTE — Progress Notes (Signed)
Patient is medically stable for TTS evaluation. 

## 2022-12-23 NOTE — BH Assessment (Addendum)
@  1040, Clinician reached out to [place], Charity fundraiser and Sherryll Burger, MD via secure chat. Requested the telepsych machine to be set up in patient's room for her initial TTS assessment.  @1045 , Clinician reached out to [place], Charity fundraiser, via phone call. Clinician informed that RN was in the room with another patient. Informed staff on call that TTS is ready to complete patient's TTS assessment and their assistance is needed to set up the tele assessment machine. Requested [facility] nursing staff to reach out to TTS when patient is ready to be assessed.

## 2022-12-23 NOTE — Discharge Summary (Signed)
Physician Discharge Summary  Chelsea Gross MWN:[id number] DOB: [birth year] DOA: 12/16/2022  PCP: Chelsea Ip, DO  Admit date: 12/16/2022  Discharge date: 12/23/2022  Admitted From:Home  Disposition:  [facility]  Recommendations for Outpatient Follow-up:  Follow up with behavioral health facility Continue on medications as below  Home Health: None  Equipment/Devices: None  Discharge Condition:Stable  CODE STATUS: Full  Diet recommendation: Heart Healthy  Brief/Interim Summary: 57 y.o. female with medical history significant of depression, anxiety, GERD, hyperlipidemia, hypothyroidism, suicidal behavior with attempted self-injury, and more presents the ED with a chief complaint of overdose.  Is reported that she got in a fight with her husband.  She was quite upset about it so she talked to the children.  She then took 50 Xanax pills.  She then called a different family member and told them what she had done.  EMS was called.  No further complaints could be discussed at this time due to patient's altered mental status. Pt has been combattive at times, yelling out and striking out at staff and therefore was maintained on Precedex drip until her behavior improved.  She was medically stable and was evaluated by TTS on 5/29 with recommendation for inpatient psychiatric hospitalization.  She had CT studies with negative findings as well as EEG with no significant findings.  She was noted to have some aspiration pneumonia and has completed her course of Unasyn and is in stable condition for discharge.  She will continue on home medications as prior.  Discharge Diagnoses:  Principal Problem:   Overdose Active Problems:   Depression   Hyperlipidemia   Generalized anxiety disorder   GERD (gastroesophageal reflux disease)   Hypothyroid   Acute metabolic encephalopathy   Intentional benzodiazepine overdose (HCC)   CNS depression (HCC)   Benzodiazepine overdose, intentional self-harm,  initial encounter [facility])  Principal discharge diagnosis: Acute metabolic encephalopathy secondary to intentional benzodiazepine overdose.  Discharge Instructions  Discharge Instructions     Diet - low sodium heart healthy   Complete by: As directed    Increase activity slowly   Complete by: As directed       Allergies as of 12/23/2022       Reactions   Sulfa Antibiotics Nausea Only   Wellbutrin [bupropion]    HEART PAPITATIONS   Pantoprazole Rash        Medication List     TAKE these medications    atorvastatin 40 MG tablet Commonly known as: LIPITOR Take 1 tablet (40 mg total) by mouth daily.   clotrimazole-betamethasone cream Commonly known as: LOTRISONE Apply 1 Application topically daily. X10-14 days to left elbow   DULoxetine 60 MG capsule Commonly known as: CYMBALTA Take 1 capsule (60 mg total) by mouth daily.   Estradiol 0.25 MG/0.25GM Gel Commonly known as: Divigel Place 1 packet onto the skin daily. Apply to the lower abdomen or upper thigh.   feeding supplement Liqd Take 237 mLs by mouth 2 (two) times daily between meals. Start taking on: Dec 24, 2022   levothyroxine 25 MCG tablet Commonly known as: SYNTHROID TAKE 1 TABLET BY MOUTH DAILY BEFORE BREAKFAST.   medroxyPROGESTERone 2.5 MG tablet Commonly known as: PROVERA Take 1 tablet (2.5 mg total) by mouth daily.   metFORMIN 500 MG 24 hr tablet Commonly known as: Glucophage XR Take 1 tablet (500 mg total) by mouth daily with breakfast.   metoprolol succinate 25 MG 24 hr tablet Commonly known as: TOPROL-XL TAKE ONE TABLET BY MOUTH DAILY   omeprazole  20 MG capsule Commonly known as: PRILOSEC Take 1 capsule (20 mg total) by mouth daily.   tretinoin 0.05 % cream Commonly known as: Retin-A Apply topically at bedtime.   valACYclovir 500 MG tablet Commonly known as: VALTREX TAKE ONE TABLET BY MOUTH ONCE A DAY, INCREASE TO ONE TABLET TWO TIMES A DAY FOR 3 DAYS WITH AN OUTBREAK   Vitamin D  (Ergocalciferol) 1.25 MG (50000 UNIT) Caps capsule Commonly known as: DRISDOL Take 50,000 Units by mouth every 7 (seven) days.        Allergies  Allergen Reactions   Sulfa Antibiotics Nausea Only   Wellbutrin [Bupropion]     HEART PAPITATIONS   Pantoprazole Rash    Consultations: PCCM TTS   Procedures/Studies: EEG adult  Result Date: 01-08-23 Chelsea Quest, MD     01-08-23  5:51 PM Patient Name: Chelsea Gross MRN: [id number] Epilepsy Attending: Charlsie Gross Referring Physician/Provider: Cleora Fleet, MD Date: 01-08-23 Duration: 23.23 mins Patient history: 57yo F with intentional benzo overdose and now persistent encephalopathy. EEG to evaluate for seizure. Level of alertness: Awake AEDs during EEG study: None Technical aspects: This EEG study was done with scalp electrodes positioned according to the 10-20 International system of electrode placement. Electrical activity was reviewed with band pass filter of 1-70Hz , sensitivity of 7 uV/mm, display speed of 38mm/sec with a 60Hz  notched filter applied as appropriate. EEG data were recorded continuously and digitally stored.  Video monitoring was available and reviewed as appropriate. Description: The posterior dominant rhythm consists of 7.5 Hz activity of moderate voltage (25-35 uV) seen predominantly in posterior head regions, symmetric and reactive to eye opening and eye closing. EEG showed continuous generalized 5 to 7 Hz theta slowing. Hyperventilation and photic stimulation were not performed.   ABNORMALITY - Continuous slow, generalized IMPRESSION: This study is suggestive of mild to moderate diffuse encephalopathy, nonspecific etiology. No seizures or epileptiform discharges were seen throughout the recording. Chelsea Gross   CT HEAD WO CONTRAST ( )  Result Date: 12/21/2022 CLINICAL DATA:  Overdose with mental status change. EXAM: CT HEAD WITHOUT CONTRAST TECHNIQUE: Contiguous axial images were obtained  from the base of the skull through the vertex without intravenous contrast. RADIATION DOSE REDUCTION: This exam was performed according to the departmental dose-optimization program which includes automated exposure control, adjustment of the mA and/or kV according to patient size and/or use of iterative reconstruction technique. COMPARISON:  None Available. FINDINGS: Brain: No evidence of acute infarction, hemorrhage, hydrocephalus, extra-axial collection or mass lesion/mass effect. Vascular: No hyperdense vessel or unexpected calcification. Skull: Normal. Negative for fracture or focal lesion. Sinuses/Orbits: No acute finding. IMPRESSION: Negative head CT. Electronically Signed   By: Tiburcio Pea M.D.   On: 12/21/2022 12:13   DG CHEST PORT 1 VIEW  Result Date: 12/20/2022 CLINICAL DATA:  Chest congestion. Chest rales. EXAM: PORTABLE CHEST 1 VIEW COMPARISON:  Chest CT 07/07/2019, no interval imaging available FINDINGS: Low lung volumes. There is mild patient rotation. The heart is mildly enlarged, likely accentuated by portable AP technique. There are patchy opacities in the right perihilar and infrahilar region. No significant pleural effusion. No pneumothorax. IMPRESSION: 1. Patchy right perihilar and infrahilar opacities, suspicious for pneumonia. 2. Mild cardiomegaly likely accentuated by portable AP technique. Electronically Signed   By: Narda Rutherford M.D.   On: 12/20/2022 12:32     Discharge Exam: Vitals:   12/23/22 1600 12/23/22 1642  BP: (!) 150/90 129/73  Pulse:  98  Resp: (!) 9 20  Temp: 97.7 F (36.5 C) 98.7 F (37.1 C)  SpO2:  97%   Vitals:   12/23/22 1300 12/23/22 1400 12/23/22 1600 12/23/22 1642  BP: (!) 156/84 (!) 143/74 (!) 150/90 129/73  Pulse: (!) 107   98  Resp: 18 11 (!) 9 20  Temp:   97.7 F (36.5 C) 98.7 F (37.1 C)  TempSrc:   Oral Oral  SpO2:    97%  Weight:      Height:        General: Pt is alert, awake, not in acute distress Cardiovascular: RRR, S1/S2  +, no rubs, no gallops Respiratory: CTA bilaterally, no wheezing, no rhonchi Abdominal: Soft, NT, ND, bowel sounds + Extremities: no edema, no cyanosis    The results of significant diagnostics from this hospitalization (including imaging, microbiology, ancillary and laboratory) are listed below for reference.     Microbiology: Recent Results (from the past 240 hour(s))  MRSA Next Gen by PCR, Nasal     Status: None   Collection Time: 12/16/22 10:23 PM   Specimen: Nasal Mucosa; Nasal Swab  Result Value Ref Range Status   MRSA by PCR Next Gen NOT DETECTED NOT DETECTED Final    Comment: (NOTE) The GeneXpert MRSA Assay (FDA approved for NASAL specimens only), is one component of a comprehensive MRSA colonization surveillance program. It is not intended to diagnose MRSA infection nor to guide or monitor treatment for MRSA infections. Test performance is not FDA approved in patients less than 25 years old. Performed at [facility], [address] [place], [place] [address]      Labs: BNP (last 3 results) No results for input(s): "BNP" in the last 8760 hours. Basic Metabolic Panel: Recent Labs  Lab 12/17/22 0452 12/18/22 0445 12/19/22 0316 12/20/22 0448 12/21/22 0441 12/22/22 0405 12/23/22 0512  NA 137 137 137 136 137 137 139  K 4.6 3.4* 4.0 3.8 3.0* 2.8* 3.1*  CL 105 107 103 106 106 102 107  CO2 26 22 21* 18* 18* 22 18*  GLUCOSE 102* 97 96 166* 150* 139* 113*  BUN 11 14 9 7 6  <5* 5*  CREATININE 0.76 0.74 0.61 0.67 0.58 0.60 0.59  CALCIUM 8.7* 8.4* 8.8* 8.2* 8.0* 8.2* 8.2*  MG 2.1 1.9  --  1.9  --  1.6* 2.0   Liver Function Tests: Recent Labs  Lab 12/19/22 0316 12/20/22 0448 12/21/22 0441 12/22/22 0405 12/23/22 0512  AST 25 28 28 25 18   ALT 22 27 30  34 30  ALKPHOS 91 76 76 71 72  BILITOT 1.4* 1.1 1.1 1.0 1.1  PROT 7.2 6.6 6.2* 6.1* 6.6  ALBUMIN 3.8 3.2* 2.8* 2.7* 3.0*   No results for input(s): "LIPASE", "AMYLASE" in the last 168 hours. No results for  input(s): "AMMONIA" in the last 168 hours. CBC: Recent Labs  Lab 12/17/22 0452 12/20/22 0448 12/21/22 0441 12/22/22 0405 12/23/22 0512  WBC 8.7 9.1 9.8 8.2 7.9  NEUTROABS 4.6 6.3 7.2 5.9 4.7  HGB 12.4 12.9 11.4* 10.9* 12.1  HCT 38.5 39.1 32.6* 31.1* 36.0  MCV 95.5 93.8 90.8 89.4 90.7  PLT 299 258 247 262 304   Cardiac Enzymes: Recent Labs  Lab 12/18/22 0445  CKTOTAL 115   BNP: Invalid input(s): "POCBNP" CBG: Recent Labs  Lab 12/22/22 1125 12/22/22 2107 12/23/22 0759 12/23/22 1117 12/23/22 1624  GLUCAP 101* 87 120* 127* 120*   D-Dimer No results for input(s): "DDIMER" in the last 72 hours. Hgb A1c No results for input(s): "HGBA1C" in  the last 72 hours. Lipid Profile No results for input(s): "CHOL", "HDL", "LDLCALC", "TRIG", "CHOLHDL", "LDLDIRECT" in the last 72 hours. Thyroid function studies No results for input(s): "TSH", "T4TOTAL", "T3FREE", "THYROIDAB" in the last 72 hours.  Invalid input(s): "FREET3" Anemia work up Recent Labs    12/21/22 1534  VITAMINB12 193  FOLATE 5.9*   Urinalysis    Component Value Date/Time   BILIRUBINUR n 09/24/2017 0944   PROTEINUR n 09/24/2017 0944   UROBILINOGEN 0.2 09/24/2017 0944   NITRITE n 09/24/2017 0944   LEUKOCYTESUR Negative 09/24/2017 0944   Sepsis Labs Recent Labs  Lab 12/20/22 0448 12/21/22 0441 12/22/22 0405 12/23/22 0512  WBC 9.1 9.8 8.2 7.9   Microbiology Recent Results (from the past 240 hour(s))  MRSA Next Gen by PCR, Nasal     Status: None   Collection Time: 12/16/22 10:23 PM   Specimen: Nasal Mucosa; Nasal Swab  Result Value Ref Range Status   MRSA by PCR Next Gen NOT DETECTED NOT DETECTED Final    Comment: (NOTE) The GeneXpert MRSA Assay (FDA approved for NASAL specimens only), is one component of a comprehensive MRSA colonization surveillance program. It is not intended to diagnose MRSA infection nor to guide or monitor treatment for MRSA infections. Test performance is not FDA approved  in patients less than 34 years old. Performed at [facility], [address] [place], [place] [address]      Time coordinating discharge: 35 minutes  SIGNED:   Erick Blinks, DO [facility] 12/23/2022, 5:43 PM  If 7PM-7AM, please contact night-coverage [email or web address]

## 2022-12-23 NOTE — Progress Notes (Signed)
LCSW Progress Note  [id number]   Chelsea Gross  12/23/2022  3:38 PM  Description:   Inpatient Psychiatric Referral  Patient was recommended inpatient per Assunta Found, NP. There are no available beds at [facility], per [facility] Rona Ravens, RN. Patient was referred to the following out of network facilities:   Destination  Service Provider Address Phone Fax  [facility]  [address] [place] [address] [phone]  [facility] [address]  [address] [place] [address] [phone]  [facility]  [address], [place] [address] [phone]  [facility]  [address] [place] [address] [phone]  [facility]  [phone] [place] [address] [phone]  [facility]  [address] [place], [place] [address] [phone]  [facility]  [address] [place] [address] [phone]  [facility]  [address] [place] [address] [phone]  [facility]  [address] [place] [address] [phone]  [facility]  [address] [place] [address] [phone]  [facility]  [address], [place] [address] [phone]  [facility]  [address] [place] [address] [phone]  [facility]  [address], [place] [address] [phone]  [facility]  [address], [place] [address] [phone]  [facility]  [address], [place] [address] [phone]  [facility]  [address], [place] [address] [phone]  [facility]  [address] [place] [address] [phone]  [facility]  [address], [place] [address] [phone]  [facility]  [address] [place], [place] [address] [phone]  [facility] [address] [place] [address] [phone]  [facility]  [address], [place] [address] [phone]  [facility]  [address], [place] [address] [phone]  [facility]  [address] [place] [address] [phone]  [facility]  [address], [place] [address] [phone]  [facility]  [address] [place], [place] [address] [phone]  [facility] [address] [place]  [address], [place] [address] [phone]  [facility]  [address] [place] [address] [phone]    Situation ongoing, CSW to continue following and update chart as more information becomes available.      Cathie Beams, [place]  12/23/2022 3:38 PM

## 2022-12-23 NOTE — Progress Notes (Signed)
Patients daughter Georgia Lopes [phone]) requesting to be contacted in regards to patients placement if and when any beds open up.

## 2022-12-23 NOTE — Progress Notes (Signed)
PROGRESS NOTE    Chelsea Gross  WUJ:[id number] DOB: [birth year] DOA: 12/16/2022 PCP: Raliegh Ip, DO   Brief Narrative:  57 y.o. female with medical history significant of depression, anxiety, GERD, hyperlipidemia, hypothyroidism, suicidal behavior with attempted self-injury, and more presents the ED with a chief complaint of overdose.  Is reported that she got in a fight with her husband.  She was quite upset about it so she talked to the children.  She then took 50 Xanax pills.  She then called a different family member and told them what she had done.  EMS was called.  No further complaints could be discussed at this time due to patient's altered mental status. Pt has been combattive at times, yelling out and striking out at staff.     Assessment & Plan:   Principal Problem:   Overdose Active Problems:   Depression   Hyperlipidemia   Generalized anxiety disorder   GERD (gastroesophageal reflux disease)   Hypothyroid   Acute metabolic encephalopathy   Intentional benzodiazepine overdose (HCC)   CNS depression (HCC)   Benzodiazepine overdose, intentional self-harm, initial encounter (HCC)  Assessment and Plan:   Intentional Benzodiazepine Overdose - Seems to be intentional overdose as patient reportedly took about 50-80 pills of her spouse's Xanax (alprazolam) - Continue fluids and continue to monitor - continue ICU care, have been discussing case with PCCM in [place], discussed case again on 5/25 with Dr. Wynona Neat -Appreciate TTS evaluation   Acute metabolic encephalopathy-resolved - Due to intentional high dose Xanax overdose (50-80 tablets reportedly taken) - Pt remains encephalopathic, now requiring safety sitter and restraints for staff/pt safety; added IV haloperidol for severe agitation 5/24  -- 5/25 severe agitation persists, discussed with PCCM Dr. Wynona Neat and recommendation was to start IV precedex infusion titrated,continue for 24 hours then start to  wean, this is being done to prevent patient from injuring herself or others  -- remains sedated on precedex infusion, we have been unable to wean due to severe delirium -- 5/28 slowly starting to interact, speak, answer questions, eat ice chips and take sips, not eaten a meal yet -- CT head without contrast ordered - negative for acute findings -- IV thiamine 500 mg daily x 3 ordered  -- EEG ordered -with no acute findings   Acute Benzodiazepine Withdrawal with delirium  - daughter reported that even though patient had not been prescribed alprazolam, she has been continuously on benzodiazepines for at least 25 years and she had been taking her husband's alprazolam prescription and she made me aware that patient had been hoarding a supply of alprazolam from her husband's prescription.   - daughter made me aware that patient was no longer being prescribed alprazolam at least the last year and a half because she had a positive drug screen for marijuana.  - patient is slowly improving on supportive measures, weaning off precedex infusion slowly as able  - consult to TTS when medically stabilized and cleared by medical team   Aspiration Pneumonia  -- seen on CXR -- started ampicillin sulbactam - plan to treat for at least 3-5 days -- continue Duoneb bronchodilator treatments   Normal Anion Gap Metabolic Acidosis -- changed IV fluid to sodium bicarbonate/dextrose isotonic infusion -- resolved; DC bicarb solution 5/28   Hypokalemia / Hypomagnesemia -- IV replacement ordered, recheck Mg in AM  -- BMP in AM    Hypothyroid - Continue Synthroid when able to take p.o. again, IV dose to be given 1 week after  last oral dose   GERD (gastroesophageal reflux disease) - Continue PPI (changed to IV as pt not able to take p.o. at this time)   Generalized anxiety disorder / Benzodiazepine dependence  - Continue Cymbalta when able to take oral again - Holding Xanax in the setting of Xanax overdose  (reportedly is not prescribed this but was taking the husband's prescription)   Hyperlipidemia - Continue statin when able to take oral    Depression - Intentional suicide attempt - Continue Cymbalta - Patient will need psych consult when medically cleared  - One-on-one safety sitter at bedside    DVT prophylaxis: [place] heparin  Code Status: Full Family Communication: discussed case in depth with both sisters at bedside 5/25, daughter updated bedside 5/28, telephone call to spouse 5/28, no answer Disposition: Status is: Inpatient Remains inpatient appropriate because: intensity    Consultants:  PCCM - discussed case with Dr. Wynona Neat 5/25 TTS  Procedures:    Antimicrobials:  Ampicillin/sulbactam 5/26>>     Subjective: Patient seen and evaluated today with no new acute complaints or concerns. No acute concerns or events noted overnight.  Objective: Vitals:   12/23/22 0900 12/23/22 1000 12/23/22 1100 12/23/22 1200  BP: (!) 164/85 (!) 161/75 (!) 152/55 (!) 149/78  Pulse: (!) 121 (!) 106  99  Resp: 18 18 20 15   Temp:      TempSrc:      SpO2: 94% 96%  94%  Weight:      Height:        Intake/Output Summary (Last 24 hours) at 12/23/2022 1241 Last data filed at 12/23/2022 0845 Gross per 24 hour  Intake 2112.42 ml  Output 2250 ml  Net -137.58 ml   Filed Weights   12/17/22 0500 12/18/22 0500 12/20/22 0500  Weight: 91.3 kg 91.3 kg 93.2 kg    Examination:  General exam: Appears calm and comfortable  Respiratory system: Clear to auscultation. Respiratory effort normal. Cardiovascular system: S1 & S2 heard, RRR.  Gastrointestinal system: Abdomen is soft Central nervous system: Alert and awake Extremities: No edema Skin: No significant lesions noted Psychiatry: Flat affect.    Data Reviewed: I have personally reviewed following labs and imaging studies  CBC: Recent Labs  Lab 12/17/22 0452 12/20/22 0448 12/21/22 0441 12/22/22 0405 12/23/22 0512  WBC 8.7 9.1 9.8  8.2 7.9  NEUTROABS 4.6 6.3 7.2 5.9 4.7  HGB 12.4 12.9 11.4* 10.9* 12.1  HCT 38.5 39.1 32.6* 31.1* 36.0  MCV 95.5 93.8 90.8 89.4 90.7  PLT 299 258 247 262 304   Basic Metabolic Panel: Recent Labs  Lab 12/17/22 0452 12/18/22 0445 12/19/22 0316 12/20/22 0448 12/21/22 0441 12/22/22 0405 12/23/22 0512  NA 137 137 137 136 137 137 139  K 4.6 3.4* 4.0 3.8 3.0* 2.8* 3.1*  CL 105 107 103 106 106 102 107  CO2 26 22 21* 18* 18* 22 18*  GLUCOSE 102* 97 96 166* 150* 139* 113*  BUN 11 14 9 7 6  <5* 5*  CREATININE 0.76 0.74 0.61 0.67 0.58 0.60 0.59  CALCIUM 8.7* 8.4* 8.8* 8.2* 8.0* 8.2* 8.2*  MG 2.1 1.9  --  1.9  --  1.6* 2.0   GFR: Estimated Creatinine Clearance: 85.9 mL/min (by C-G formula based on SCr of 0.59 mg/dL). Liver Function Tests: Recent Labs  Lab 12/19/22 0316 12/20/22 0448 12/21/22 0441 12/22/22 0405 12/23/22 0512  AST 25 28 28 25 18   ALT 22 27 30  34 30  ALKPHOS 91 76 76 71 72  BILITOT  1.4* 1.1 1.1 1.0 1.1  PROT 7.2 6.6 6.2* 6.1* 6.6  ALBUMIN 3.8 3.2* 2.8* 2.7* 3.0*   No results for input(s): "LIPASE", "AMYLASE" in the last 168 hours. No results for input(s): "AMMONIA" in the last 168 hours. Coagulation Profile: No results for input(s): "INR", "PROTIME" in the last 168 hours. Cardiac Enzymes: Recent Labs  Lab 12/18/22 0445  CKTOTAL 115   BNP (last 3 results) No results for input(s): "PROBNP" in the last 8760 hours. HbA1C: No results for input(s): "HGBA1C" in the last 72 hours. CBG: Recent Labs  Lab 10-Jan-2023 0820 01/10/2023 1125 Jan 10, 2023 2107 12/23/22 0759 12/23/22 1117  GLUCAP 136* 101* 87 120* 127*   Lipid Profile: No results for input(s): "CHOL", "HDL", "LDLCALC", "TRIG", "CHOLHDL", "LDLDIRECT" in the last 72 hours. Thyroid Function Tests: No results for input(s): "TSH", "T4TOTAL", "FREET4", "T3FREE", "THYROIDAB" in the last 72 hours. Anemia Panel: Recent Labs    12/21/22 1534  VITAMINB12 193  FOLATE 5.9*   Sepsis Labs: No results for  input(s): "PROCALCITON", "LATICACIDVEN" in the last 168 hours.  Recent Results (from the past 240 hour(s))  MRSA Next Gen by PCR, Nasal     Status: None   Collection Time: 12/16/22 10:23 PM   Specimen: Nasal Mucosa; Nasal Swab  Result Value Ref Range Status   MRSA by PCR Next Gen NOT DETECTED NOT DETECTED Final    Comment: (NOTE) The GeneXpert MRSA Assay (FDA approved for NASAL specimens only), is one component of a comprehensive MRSA colonization surveillance program. It is not intended to diagnose MRSA infection nor to guide or monitor treatment for MRSA infections. Test performance is not FDA approved in patients less than 25 years old. Performed at [facility], [address] [place], [place] [address]          Radiology Studies: EEG adult  Result Date: 2023-01-10 Charlsie Quest, MD     01/10/23  5:51 PM Patient Name: Destany Beauman MRN: [id number] Epilepsy Attending: Charlsie Quest Referring Physician/Provider: Cleora Fleet, MD Date: 01/10/2023 Duration: 23.23 mins Patient history: 57yo F with intentional benzo overdose and now persistent encephalopathy. EEG to evaluate for seizure. Level of alertness: Awake AEDs during EEG study: None Technical aspects: This EEG study was done with scalp electrodes positioned according to the 10-20 International system of electrode placement. Electrical activity was reviewed with band pass filter of 1-70Hz , sensitivity of 7 uV/mm, display speed of 27mm/sec with a 60Hz  notched filter applied as appropriate. EEG data were recorded continuously and digitally stored.  Video monitoring was available and reviewed as appropriate. Description: The posterior dominant rhythm consists of 7.5 Hz activity of moderate voltage (25-35 uV) seen predominantly in posterior head regions, symmetric and reactive to eye opening and eye closing. EEG showed continuous generalized 5 to 7 Hz theta slowing. Hyperventilation and photic stimulation were not  performed.   ABNORMALITY - Continuous slow, generalized IMPRESSION: This study is suggestive of mild to moderate diffuse encephalopathy, nonspecific etiology. No seizures or epileptiform discharges were seen throughout the recording. [name]        Scheduled Meds:  atorvastatin  40 mg Oral Daily   Chlorhexidine Gluconate Cloth  6 each Topical Daily   DULoxetine  60 mg Oral Daily   famotidine  20 mg Oral QHS   heparin  5,000 Units Subcutaneous Q8H   levothyroxine  25 mcg Oral QAC breakfast   metoprolol succinate  12.5 mg Oral Daily   potassium chloride  40 mEq Oral BID  Continuous Infusions:  ampicillin-sulbactam (UNASYN) IV 3 g (12/23/22 1610)   dexmedetomidine (PRECEDEX) IV infusion Stopped (12/22/22 1524)   dextrose 5 % and 0.9 % NaCl with KCl 20 mEq/L 50 mL/hr at 12/23/22 0101     LOS: 6 days    Time spent: 35 minutes    Chestine Belknap D Sherryll Burger, DO [facility]  If 7PM-7AM, please contact night-coverage [email or web address] 12/23/2022, 12:41 PM

## 2022-12-24 ENCOUNTER — Encounter (HOSPITAL_COMMUNITY): Payer: Self-pay | Admitting: Family Medicine

## 2022-12-24 LAB — BASIC METABOLIC PANEL WITH GFR
Anion gap: 10 (ref 5–15)
BUN: 5 mg/dL — ABNORMAL LOW (ref 6–20)
CO2: 22 mmol/L (ref 22–32)
Calcium: 8.8 mg/dL — ABNORMAL LOW (ref 8.9–10.3)
Chloride: 106 mmol/L (ref 98–111)
Creatinine, Ser: 0.67 mg/dL (ref 0.44–1.00)
GFR, Estimated: >60 mL/min
Glucose, Bld: 103 mg/dL — ABNORMAL HIGH (ref 70–99)
Potassium: 3.9 mmol/L (ref 3.5–5.1)
Sodium: 138 mmol/L (ref 135–145)

## 2022-12-24 LAB — CBC WITH DIFFERENTIAL/PLATELET
Abs Immature Granulocytes: 0.15 10*3/uL — ABNORMAL HIGH (ref 0.00–0.07)
Basophils Absolute: 0.1 10*3/uL (ref 0.0–0.1)
Basophils Relative: 1 %
Eosinophils Absolute: 0.2 10*3/uL (ref 0.0–0.5)
Eosinophils Relative: 3 %
HCT: 34.9 % — ABNORMAL LOW (ref 36.0–46.0)
Hemoglobin: 11.5 g/dL — ABNORMAL LOW (ref 12.0–15.0)
Immature Granulocytes: 2 %
Lymphocytes Relative: 35 %
Lymphs Abs: 2.7 10*3/uL (ref 0.7–4.0)
MCH: 30.7 pg (ref 26.0–34.0)
MCHC: 33 g/dL (ref 30.0–36.0)
MCV: 93.3 fL (ref 80.0–100.0)
Monocytes Absolute: 0.7 10*3/uL (ref 0.1–1.0)
Monocytes Relative: 8 %
Neutro Abs: 4 10*3/uL (ref 1.7–7.7)
Neutrophils Relative %: 51 %
Platelets: 331 10*3/uL (ref 150–400)
RBC: 3.74 MIL/uL — ABNORMAL LOW (ref 3.87–5.11)
RDW: 13.7 % (ref 11.5–15.5)
WBC: 7.8 10*3/uL (ref 4.0–10.5)
nRBC: 0 % (ref 0.0–0.2)

## 2022-12-24 LAB — GLUCOSE, CAPILLARY
Glucose-Capillary: 107 mg/dL — ABNORMAL HIGH (ref 70–99)
Glucose-Capillary: 119 mg/dL — ABNORMAL HIGH (ref 70–99)
Glucose-Capillary: 120 mg/dL — ABNORMAL HIGH (ref 70–99)
Glucose-Capillary: 127 mg/dL — ABNORMAL HIGH (ref 70–99)
Glucose-Capillary: 135 mg/dL — ABNORMAL HIGH (ref 70–99)
Glucose-Capillary: 153 mg/dL — ABNORMAL HIGH (ref 70–99)

## 2022-12-24 LAB — SARS CORONAVIRUS 2 BY RT PCR: SARS Coronavirus 2 by RT PCR: NEGATIVE

## 2022-12-24 LAB — MAGNESIUM: Magnesium: 2.1 mg/dL (ref 1.7–2.4)

## 2022-12-24 MED ORDER — GUAIFENESIN-DM 100-10 MG/5ML PO SYRP
5.0000 mL | ORAL_SOLUTION | ORAL | Status: DC | PRN
  Administered 2022-12-24: 5 mL via ORAL
  Filled 2022-12-24: qty 5

## 2022-12-24 NOTE — Evaluation (Signed)
Physical Therapy Evaluation Patient Details Name: Chelsea Gross MRN: [id number] DOB: February 06, 1966 Today's Date: 12/24/2022  History of Present Illness  Chelsea Gross is a 57 y.o. female with medical history significant of depression, anxiety, GERD, hyperlipidemia, hypothyroidism, suicidal behavior with attempted self-injury, and more presents the ED with a chief complaint of overdose.  Is reported that she got in a fight with her husband.  She was quite upset about it so she talked to the children.  She then took 50 Xanax pills.  She then called a different family member and told them what she had done.  EMS was called.  No further complaints could be discussed at this time due to patient's altered mental status   Clinical Impression  Patient has to lean on nearby objects for support when taking steps without AD, required use of  RW for safety with fair/good carryover for use, occasional drifting left/right without loss of balance and limited mostly due to fatigue.  Patient tolerated sitting up in chair with sitter in room after therapy.  Patient will benefit from continued skilled physical therapy in hospital and recommended venue below to increase strength, balance, endurance for safe ADLs and gait.         Recommendations for follow up therapy are one component of a multi-disciplinary discharge planning process, led by the attending physician.  Recommendations may be updated based on patient status, additional functional criteria and insurance authorization.  Follow Up Recommendations       Assistance Recommended at Discharge Set up Supervision/Assistance  Patient can return home with the following  A little help with walking and/or transfers;A little help with bathing/dressing/bathroom;Help with stairs or ramp for entrance;Assistance with cooking/housework    Equipment Recommendations None recommended by PT  Recommendations for Other Services       Functional Status Assessment  Patient has had a recent decline in their functional status and demonstrates the ability to make significant improvements in function in a reasonable and predictable amount of time.     Precautions / Restrictions Precautions Precautions: Fall Restrictions Weight Bearing Restrictions: No      Mobility  Bed Mobility Overal bed mobility: Modified Independent                  Transfers Overall transfer level: Needs assistance Equipment used: None, Rolling walker (2 wheels) Transfers: Sit to/from Stand, Bed to chair/wheelchair/BSC Sit to Stand: Min guard, Min assist   Step pivot transfers: Min guard, Min assist       General transfer comment: unsteady labored movement having to lean on nearby objects for support without AD, required use of RW for safety    Ambulation/Gait Ambulation/Gait assistance: Supervision, Min guard Gait Distance (Feet): 75 Feet Assistive device: Rolling walker (2 wheels) Gait Pattern/deviations: Decreased step length - right, Decreased step length - left, Decreased stride length, Drifts right/left Gait velocity: decreased     General Gait Details: slow labored cadence with occasional drifting right/left without loss of balance, limited mostly due to c/o fatigue  Stairs            Wheelchair Mobility    Modified Rankin (Stroke Patients Only)       Balance Overall balance assessment: Needs assistance Sitting-balance support: Feet supported, No upper extremity supported Sitting balance-Leahy Scale: Good Sitting balance - Comments: seated at EOB   Standing balance support: During functional activity, No upper extremity supported Standing balance-Leahy Scale: Poor Standing balance comment: fair/poor without AD, fair/good using RW  Pertinent Vitals/Pain Pain Assessment Pain Assessment: No/denies pain    Home Living Family/patient expects to be discharged to:: Private residence Living  Arrangements: Spouse/significant other Available Help at Discharge: Family;Available PRN/intermittently Type of Home: House Home Access: Stairs to enter Entrance Stairs-Rails: Right;Left;Can reach both Entrance Stairs-Number of Steps: 2   Home Layout: One level Home Equipment: Agricultural consultant (2 wheels);Cane - single point;Grab bars - tub/shower      Prior Function Prior Level of Function : Independent/Modified Independent;Driving             Mobility Comments: Tourist information centre manager without AD, drives       Hand Dominance        Extremity/Trunk Assessment   Upper Extremity Assessment Upper Extremity Assessment: Overall WFL for tasks assessed    Lower Extremity Assessment Lower Extremity Assessment: Generalized weakness    Cervical / Trunk Assessment Cervical / Trunk Assessment: Normal  Communication   Communication: No difficulties  Cognition Arousal/Alertness: Awake/alert Behavior During Therapy: WFL for tasks assessed/performed Overall Cognitive Status: Within Functional Limits for tasks assessed                                          General Comments      Exercises     Assessment/Plan    PT Assessment Patient needs continued PT services  PT Problem List Decreased strength;Decreased activity tolerance;Decreased balance;Decreased mobility       PT Treatment Interventions DME instruction;Gait training;Stair training;Functional mobility training;Therapeutic activities;Therapeutic exercise;Patient/family education;Balance training    PT Goals (Current goals can be found in the Care Plan section)  Acute Rehab PT Goals Patient Stated Goal: return home PT Goal Formulation: With patient Time For Goal Achievement: 12/28/22 Potential to Achieve Goals: Good    Frequency Min 3X/week     Co-evaluation               AM-PAC PT "6 Clicks" Mobility  Outcome Measure Help needed turning from your back to your side while in a flat bed  without using bedrails?: None Help needed moving from lying on your back to sitting on the side of a flat bed without using bedrails?: None Help needed moving to and from a bed to a chair (including a wheelchair)?: A Little Help needed standing up from a chair using your arms (e.g., wheelchair or bedside chair)?: A Little Help needed to walk in hospital room?: A Little Help needed climbing 3-5 steps with a railing? : A Lot 6 Click Score: 19    End of Session   Activity Tolerance: Patient tolerated treatment well;Patient limited by fatigue Patient left: in chair;with call bell/phone within reach;with nursing/sitter in room Nurse Communication: Mobility status PT Visit Diagnosis: Unsteadiness on feet (R26.81);Other abnormalities of gait and mobility (R26.89);Muscle weakness (generalized) (M62.81)    Time: 2130-8657 PT Time Calculation (min) (ACUTE ONLY): 21 min   Charges:   PT Evaluation $PT Eval Moderate Complexity: 1 Mod PT Treatments $Therapeutic Activity: 8-22 mins        12:28 PM, 12/24/22 Ocie Bob, MPT Physical Therapist with [facility] 336 [phone] office [phone] mobile phone

## 2022-12-24 NOTE — Progress Notes (Signed)
1:42 PM - CSW spoke with Para March, intake coordinator, at [facility] via phone call. Pt has been accepted for tomorrow 5/31, pending IVC paperwork and negative covid faxed to [phone].   Cathie Beams, LCSW  12/24/2022 1:46 PM

## 2022-12-24 NOTE — Consult Note (Signed)
Telepsych Consultation   Reason for Consult:  SI/overdose on xanax Referring Physician:  Erick Blinks, DO Location of Patient: [facility]DE[facility] 300 Location of Provider: Other: [facility]  Patient Identification: Chelsea Gross MRN:  [id number] Principal Diagnosis: Overdose Diagnosis:  Principal Problem:   Overdose Active Problems:   Depression   Hyperlipidemia   Generalized anxiety disorder   GERD (gastroesophageal reflux disease)   Hypothyroid   Acute metabolic encephalopathy   Intentional benzodiazepine overdose (HCC)   CNS depression (HCC)   Benzodiazepine overdose, intentional self-harm, initial encounter (HCC)   Total Time spent with patient: 30 minutes  Subjective:  Chelsea Gross is a 57 y.o. female patient admitted to the [facility] medical floor post overdose in a suicide attempt. Per TTS consult note: "Chelsea Gross is a 57 y/o female admitted to the [facility] medical floor post overdose. Significant history of depression, anxiety, GERD, hyperlipidemia, hypothyroidism, suicidal behavior with attempted self-injury, and more presents the ED with a chief complaint of overdose. Per ED notes:  "Patient reported that she got in a fight with her husband.  She was quite upset about it so she talked to the children.  She then took 50 Xanax pills.  She then called a different family member and told them what she had done.  EMS was called.  No further complaints could be discussed at this time due to patient's altered mental status. Pt has been combattive at times, yelling out and striking out at staff. "   "Patient states that she successfully made an intentional overdose on her prescribed Xanax. She reports consuming 80 Xanax pills. The suicide attempt was triggered by job loss. Also, patient states that she had been fighting with her spouse. She reports prior suicide attempts on once occasion when she was 57 years old which was also an overdose. History of self-injurious behaviors (cutting). She last  cut herself 6 years ago, after an argument wither spouse. She has access to means (medications and a firearm). States, "I have a gun, but I don't even keep it loaded, it's just in the house for protection".  "Patient with depressive symptoms that include feelings of hopelessness/worthlessness, anger/irritability, poor concentration, and insomnia. She sleeps 7-9 hrs per night. Appetite varies. No significant weight loss/gain. She reports severe anxiety symptoms. No hx of panic attacks.  Patient denies homicidal ideations. No hx of aggressive/assaultive behaviors. No legal issues. She has no pending court dates and is not on probation/parole. No AVH's. No history of alcohol/drug use. No family history of substance abuse.  No history of inpatient psychiatric treatment. No current therapist/psychiatrist."    HPI: Patient seen and reevaluated by this provider via teleassessment, chart reviewed and case discussed with Dr. Viviano Simas. On evaluation, patient is sitting upright on the side of the bed in no acute distress. She is alert and oriented x 4. Her thought process is linear and speech is clear and coherent. She denies SI/HI/AVH. There is no objective evidence that the patient is currently responding to internal or external stimuli, or experiencing paranoia or delusional thought content on exam. Her mood is depressed and affect is congruent. She has fair eye contact. She is calm and cooperative. Patient states that she was brought to the [facility] after she "took a bunch of drugs."  She reports taking a 0.5 mg Xanax, approximately 80 pills. She states that she was upset over things in her life. When asked to elaborate, she states that she recently lost her job last month, was arguing  with her husband over stupid things and was triggered by bad childhood stuff. She states that her mother had her favorites and that her and her sister was not her mother's favorites. When asked if ingesting a bunch of pills  was a suicide attempt, she states, "it could have been a suicide attempt if I had been successful but I am glad it wasn't." She reports one past suicide attempt by overdosing on an unknown amount of mixed pills at age 14 y.o. She reports poor sleep in the hospital due to not being able to sleep somewhere else. She reports sleeping on average 2 hours per night. She reports a poor appetite and states that the food is not that great. She reports taking xanax 0.5 mg po as needed for anxiety. She reports taking on average taking a 1/2 tablet of the 0.5 mg Xanax daily, once a week for anxiety. She denies benzodiazepine withdrawal, no nausea, vomiting, diarrhea, hallucinations, tremors, headaches, or abdominal cramps. She reports having body aches from being in the bed and states that she needs PT to help her walk. She states that she does not use any assistive devices to help her ambulate but she walks holding onto the wall or needs assistance from the staff. I discussed with the patient that she is recommended for inpatient psychiatric treatment due to suicide attempt once she medically cleared .The inpatient process was explained to the patient. Patient verbalizes understanding.   Past Psychiatric History: History of MDD, GAD, one past suicide attempt by overdose at age 3 y.o. and past self injurious behaviors. No past inpatient hospitalization. No outpatient psychiatry or therapy. Patient reports taking Cymbalta 60 mg daily for the past 15 years and xanax 0.5 mg as needed for anxiety for the past couple years.    Risk to Self:  Yes  Risk to Others:  No Prior Inpatient Therapy:  No Prior Outpatient Therapy:  NO  Past Medical History:  Past Medical History:  Diagnosis Date   Chest discomfort 10/13/2019   Depression    Generalized anxiety disorder    GERD (gastroesophageal reflux disease)    History of palpitations    MARCH 2021   Hyperlipidemia 04/16/2016   Hypothyroidism    Inappropriate sinus  tachycardia 05/10/2020   Palpitations 10/13/2019   PMB (postmenopausal bleeding)    Preoperative cardiovascular examination 05/10/2020   Suicidal behavior with attempted self-injury [facility])    Urinary incontinence     Past Surgical History:  Procedure Laterality Date   chin and lip surgery  2001   COLONSCOPY  2018   5 POLYPS REMOVED   DILATATION & CURETTAGE/HYSTEROSCOPY WITH MYOSURE N/A 06/18/2020   Procedure: DILATATION & CURETTAGE/HYSTEROSCOPY WITH MYOSURE;  Surgeon: Romualdo Bolk, MD;  Location: [facility] ;  Service: Gynecology;  Laterality: N/A;   labial repair  2000   Family History:  Family History  Problem Relation Age of Onset   Heart attack Mother    Esophageal cancer Mother    Heart attack Father    Hyperlipidemia Sister    Thyroid disease Sister    Diabetes Mellitus II Sister    Diabetes Mellitus II Sister    Hyperlipidemia Sister    Diabetes Mellitus II Sister    Hyperlipidemia Sister    Hyperlipidemia Sister    Family Psychiatric  History: Patient's sister has a history of schizophrenia.   Social History:  Social History   Substance and Sexual Activity  Alcohol Use Yes   Alcohol/week: 0.0 standard drinks  of alcohol   Comment: RARE     Social History   Substance and Sexual Activity  Drug Use Yes   Comment: cannabis pills or gummies rarely LAST USED  2020 PER PT    Social History   Socioeconomic History   Marital status: Married    Spouse name: Not on file   Number of children: Not on file   Years of education: Not on file   Highest education level: Not on file  Occupational History   Occupation: CMA  Tobacco Use   Smoking status: Never   Smokeless tobacco: Never  Vaping Use   Vaping Use: Never used  Substance and Sexual Activity   Alcohol use: Yes    Alcohol/week: 0.0 standard drinks of alcohol    Comment: RARE   Drug use: Yes    Comment: cannabis pills or gummies rarely LAST USED  2020 PER PT   Sexual activity: Yes     Partners: Male    Birth control/protection: None  Other Topics Concern   Not on file  Social History Narrative   Works for [employer] in [place]   Social Determinants of Health   Financial Resource Strain: Not on file  Food Insecurity: No Food Insecurity (12/22/2022)   Hunger Vital Sign    Worried About Running Out of Food in the Last Year: Never true    Ran Out of Food in the Last Year: Never true  Transportation Needs: No Transportation Needs (12/22/2022)   PRAPARE - Administrator, Civil Service (Medical): No    Lack of Transportation (Non-Medical): No  Physical Activity: Not on file  Stress: Not on file  Social Connections: Not on file   Additional Social History:    Allergies:   Allergies  Allergen Reactions   Sulfa Antibiotics Nausea Only   Wellbutrin [Bupropion]     HEART PAPITATIONS   Pantoprazole Rash    Labs:  Results for orders placed or performed during the hospital encounter of 12/16/22 (from the past 48 hour(s))  Glucose, capillary     Status: None   Collection Time: 12/22/22  9:07 PM  Result Value Ref Range   Glucose-Capillary 87 70 - 99 mg/dL    Comment: Glucose reference range applies only to samples taken after fasting for at least 8 hours.  CBC with Differential/Platelet     Status: Abnormal   Collection Time: 12/23/22  5:12 AM  Result Value Ref Range   WBC 7.9 4.0 - 10.5 K/uL   RBC 3.97 3.87 - 5.11 MIL/uL   Hemoglobin 12.1 12.0 - 15.0 g/dL   HCT 65.7 84.6 - 96.2 %   MCV 90.7 80.0 - 100.0 fL   MCH 30.5 26.0 - 34.0 pg   MCHC 33.6 30.0 - 36.0 g/dL   RDW 95.2 84.1 - 32.4 %   Platelets 304 150 - 400 K/uL   nRBC 0.0 0.0 - 0.2 %   Neutrophils Relative % 59 %   Neutro Abs 4.7 1.7 - 7.7 K/uL   Lymphocytes Relative 27 %   Lymphs Abs 2.2 0.7 - 4.0 K/uL   Monocytes Relative 9 %   Monocytes Absolute 0.7 0.1 - 1.0 K/uL   Eosinophils Relative 3 %   Eosinophils Absolute 0.2 0.0 - 0.5 K/uL   Basophils Relative 1 %   Basophils  Absolute 0.0 0.0 - 0.1 K/uL   Immature Granulocytes 1 %   Abs Immature Granulocytes 0.08 (H) 0.00 - 0.07 K/uL    Comment:  Performed at [facility], [address], [place], [place] [address]  Magnesium     Status: None   Collection Time: 12/23/22  5:12 AM  Result Value Ref Range   Magnesium 2.0 1.7 - 2.4 mg/dL    Comment: Performed at [facility], [address], [place], [place] [address]  Comprehensive metabolic panel     Status: Abnormal   Collection Time: 12/23/22  5:12 AM  Result Value Ref Range   Sodium 139 135 - 145 mmol/L   Potassium 3.1 (L) 3.5 - 5.1 mmol/L   Chloride 107 98 - 111 mmol/L   CO2 18 (L) 22 - 32 mmol/L   Glucose, Bld 113 (H) 70 - 99 mg/dL    Comment: Glucose reference range applies only to samples taken after fasting for at least 8 hours.   BUN 5 (L) 6 - 20 mg/dL   Creatinine, Ser 8.29 0.44 - 1.00 mg/dL   Calcium 8.2 (L) 8.9 - 10.3 mg/dL   Total Protein 6.6 6.5 - 8.1 g/dL   Albumin 3.0 (L) 3.5 - 5.0 g/dL   AST 18 15 - 41 U/L   ALT 30 0 - 44 U/L   Alkaline Phosphatase 72 38 - 126 U/L   Total Bilirubin 1.1 0.3 - 1.2 mg/dL   GFR, Estimated >56 >21 mL/min    Comment: (NOTE) Calculated using the CKD-EPI Creatinine Equation (2021)    Anion gap 14 5 - 15    Comment: Performed at [facility], [address] [place], [place] [address]  Glucose, capillary     Status: Abnormal   Collection Time: 12/23/22  7:59 AM  Result Value Ref Range   Glucose-Capillary 120 (H) 70 - 99 mg/dL    Comment: Glucose reference range applies only to samples taken after fasting for at least 8 hours.  Glucose, capillary     Status: Abnormal   Collection Time: 12/23/22 11:17 AM  Result Value Ref Range   Glucose-Capillary 127 (H) 70 - 99 mg/dL    Comment: Glucose reference range applies only to samples taken after fasting for at least 8 hours.  Glucose, capillary     Status: Abnormal   Collection Time: 12/23/22  4:24 PM  Result Value Ref Range   Glucose-Capillary 120 (H) 70 - 99  mg/dL    Comment: Glucose reference range applies only to samples taken after fasting for at least 8 hours.  Glucose, capillary     Status: Abnormal   Collection Time: 12/23/22  9:08 PM  Result Value Ref Range   Glucose-Capillary 111 (H) 70 - 99 mg/dL    Comment: Glucose reference range applies only to samples taken after fasting for at least 8 hours.   Comment 1 Notify RN    Comment 2 Document in Chart   Glucose, capillary     Status: Abnormal   Collection Time: 12/24/22 12:31 AM  Result Value Ref Range   Glucose-Capillary 135 (H) 70 - 99 mg/dL    Comment: Glucose reference range applies only to samples taken after fasting for at least 8 hours.   Comment 1 Notify RN   Glucose, capillary     Status: Abnormal   Collection Time: 12/24/22  4:11 AM  Result Value Ref Range   Glucose-Capillary 107 (H) 70 - 99 mg/dL    Comment: Glucose reference range applies only to samples taken after fasting for at least 8 hours.   Comment 1 Notify RN    Comment 2 Document in Chart  CBC with Differential/Platelet     Status: Abnormal   Collection Time: 12/24/22  4:12 AM  Result Value Ref Range   WBC 7.8 4.0 - 10.5 K/uL   RBC 3.74 (L) 3.87 - 5.11 MIL/uL   Hemoglobin 11.5 (L) 12.0 - 15.0 g/dL   HCT 16.1 (L) 09.6 - 04.5 %   MCV 93.3 80.0 - 100.0 fL   MCH 30.7 26.0 - 34.0 pg   MCHC 33.0 30.0 - 36.0 g/dL   RDW 40.9 81.1 - 91.4 %   Platelets 331 150 - 400 K/uL   nRBC 0.0 0.0 - 0.2 %   Neutrophils Relative % 51 %   Neutro Abs 4.0 1.7 - 7.7 K/uL   Lymphocytes Relative 35 %   Lymphs Abs 2.7 0.7 - 4.0 K/uL   Monocytes Relative 8 %   Monocytes Absolute 0.7 0.1 - 1.0 K/uL   Eosinophils Relative 3 %   Eosinophils Absolute 0.2 0.0 - 0.5 K/uL   Basophils Relative 1 %   Basophils Absolute 0.1 0.0 - 0.1 K/uL   Immature Granulocytes 2 %   Abs Immature Granulocytes 0.15 (H) 0.00 - 0.07 K/uL    Comment: Performed at [facility], [address] [place], [place] [address]  Basic metabolic panel     Status:  Abnormal   Collection Time: 12/24/22  4:12 AM  Result Value Ref Range   Sodium 138 135 - 145 mmol/L   Potassium 3.9 3.5 - 5.1 mmol/L   Chloride 106 98 - 111 mmol/L   CO2 22 22 - 32 mmol/L   Glucose, Bld 103 (H) 70 - 99 mg/dL    Comment: Glucose reference range applies only to samples taken after fasting for at least 8 hours.   BUN 5 (L) 6 - 20 mg/dL   Creatinine, Ser 6.21 0.44 - 1.00 mg/dL   Calcium 8.8 (L) 8.9 - 10.3 mg/dL   GFR, Estimated >30 >86 mL/min    Comment: (NOTE) Calculated using the CKD-EPI Creatinine Equation (2021)    Anion gap 10 5 - 15    Comment: Performed at [facility], [address], [place], [place] [address]  Magnesium     Status: None   Collection Time: 12/24/22  4:12 AM  Result Value Ref Range   Magnesium 2.1 1.7 - 2.4 mg/dL    Comment: Performed at [facility], [address], [place], [place] [address]  Glucose, capillary     Status: Abnormal   Collection Time: 12/24/22  7:49 AM  Result Value Ref Range   Glucose-Capillary 127 (H) 70 - 99 mg/dL    Comment: Glucose reference range applies only to samples taken after fasting for at least 8 hours.  Glucose, capillary     Status: Abnormal   Collection Time: 12/24/22 11:43 AM  Result Value Ref Range   Glucose-Capillary 120 (H) 70 - 99 mg/dL    Comment: Glucose reference range applies only to samples taken after fasting for at least 8 hours.    Medications:  Current Facility-Administered Medications  Medication Dose Route Frequency Provider Last Rate Last Admin   acetaminophen (TYLENOL) tablet 650 mg  650 mg Oral Q6H PRN Zierle-Ghosh, Asia B, DO   650 mg at 12/23/22 0434   Or   acetaminophen (TYLENOL) suppository 650 mg  650 mg Rectal Q6H PRN Zierle-Ghosh, Asia B, DO       atorvastatin (LIPITOR) tablet 40 mg  40 mg Oral Daily Zierle-Ghosh, Asia B, DO   40 mg at 12/24/22 [phone]  Chlorhexidine Gluconate Cloth 2 % PADS 6 each  6 each Topical Daily Zierle-Ghosh, Asia B, DO   6 each at 12/24/22 0924    DULoxetine (CYMBALTA) DR capsule 60 mg  60 mg Oral Daily Zierle-Ghosh, Asia B, DO   60 mg at 12/24/22 0923   famotidine (PEPCID) tablet 20 mg  20 mg Oral QHS Johnson, Clanford L, MD   20 mg at 12/23/22 2057   feeding supplement (ENSURE ENLIVE / ENSURE PLUS) liquid 237 mL  237 mL Oral BID BM Sherryll Burger, Pratik D, DO   237 mL at 12/24/22 1307   guaiFENesin-dextromethorphan (ROBITUSSIN DM) 100-10 MG/5ML syrup 5 mL  5 mL Oral Q4H PRN Adefeso, Oladapo, DO   5 mL at 12/24/22 4098   haloperidol lactate (HALDOL) injection 5 mg  5 mg Intravenous Q6H PRN Laural Benes, Clanford L, MD   5 mg at 12/21/22 1929   heparin injection 5,000 Units  5,000 Units Subcutaneous Q8H Zierle-Ghosh, Asia B, DO   5,000 Units at 12/24/22 1306   hydrALAZINE (APRESOLINE) injection 10 mg  10 mg Intravenous Q4H PRN Sherryll Burger, Pratik D, DO       ipratropium-albuterol (DUONEB) 0.5-2.5 (3) MG/3ML nebulizer solution 3 mL  3 mL Nebulization Q4H PRN Johnson, Clanford L, MD   3 mL at 12/22/22 1831   levothyroxine (SYNTHROID) tablet 25 mcg  25 mcg Oral QAC breakfast Zierle-Ghosh, Asia B, DO   25 mcg at 12/24/22 1191   metoprolol succinate (TOPROL-XL) 24 hr tablet 12.5 mg  12.5 mg Oral Daily Johnson, Clanford L, MD   12.5 mg at 12/24/22 4782   multivitamin-lutein (OCUVITE-LUTEIN) capsule 1 capsule  1 capsule Oral Daily Sherryll Burger, Pratik D, DO   1 capsule at 12/24/22 0923   ondansetron (ZOFRAN) tablet 4 mg  4 mg Oral Q6H PRN Zierle-Ghosh, Asia B, DO       Or   ondansetron (ZOFRAN) injection 4 mg  4 mg Intravenous Q6H PRN Zierle-Ghosh, [place] B, DO        Musculoskeletal: Strength & Muscle Tone:  UTA Gait & Station: unsteady Patient leans: N/A    Psychiatric Specialty Exam:  Presentation  General Appearance: Appropriate for Environment  Eye Contact:Fair  Speech:Clear and Coherent  Speech Volume:Decreased  Mood and Affect  Mood:Depressed  Affect:Congruent   Thought Process  Thought Processes:Coherent  Descriptions of  Associations:Intact  Orientation:Full (Time, Place and Person)  Thought Content:Logical  History of Schizophrenia/Schizoaffective disorder:No  Duration of Psychotic Symptoms:No data recorded Hallucinations:No data recorded Ideas of Reference:None  Suicidal Thoughts:Suicidal Thoughts: No  Homicidal Thoughts:Homicidal Thoughts: No   Sensorium  Memory:Immediate Fair; Recent Fair; Remote Fair  Judgment:Intact  Insight:Present   Executive Functions  Concentration:Fair  Attention Span:Fair  Recall:Fair  Fund of Knowledge:Fair  Language:Fair   Psychomotor Activity  Psychomotor Activity:Psychomotor Activity: Decreased   Assets  Assets:Communication Skills; Desire for Improvement; Financial Resources/Insurance; Housing; Intimacy; Leisure Time; Social Support; Vocational/Educational; Talents/Skills   Sleep  Sleep:Number of Hours of Sleep: 2    Physical Exam: Physical Exam Cardiovascular:     Rate and Rhythm: Normal rate.  Pulmonary:     Effort: Pulmonary effort is normal.  Neurological:     Mental Status: She is alert and oriented to person, place, and time.    Review of Systems  Constitutional:  Positive for malaise/fatigue.  Musculoskeletal:  Positive for myalgias.   Blood pressure (!) 149/68, pulse 86, temperature 98.2 F (36.8 C), temperature source Oral, resp. rate 20, height 5\' 4"  (1.626 m), weight 90.7 kg, last  menstrual period 04/16/2020, SpO2 95 %. Body mass index is 34.32 kg/m.  Treatment Plan Summary: Patient is recommended for inpatient psychiatric treatment once medically cleared and ambulatory at baseline. Psychiatry CSW to seek appropriate inpatient psychiatric placement once stable psychiatric admission.   Medication regimen.  Continue Cymbalta 60 mg po daily for MDD.    Disposition: Recommend psychiatric Inpatient admission when medically cleared.  This service was provided via telemedicine using a 2-way, interactive audio and video  technology.  Names of all persons participating in this telemedicine service and their role in this encounter. Name: Chelsea Gross  Role: Patient   Name: Liborio Nixon  Role: NP  Name:  Role:   Name:  Role:     Layla Barter, NP 12/24/2022 1:53 PM

## 2022-12-24 NOTE — Progress Notes (Signed)
This CSW spoke with Valorie Roosevelt via phone [phone] at 1719 who informed that nursing staff within [facility] advised that pt's labs were back. This CSW informed that CSW would assist and send updates Labs/ COVID through [facility].   Through chart review CSW was unable to locate IVC paperwork scanned in pt's chart. CSW requested assistance from nursing care team to fax IVC paperwork to [phone].   Per Youlanda Mighty, RN informed that the nursing secretary has faxed the IVC paperwork to [facility].  In speaking with Para March from [facility] shared that their Intake department would not be available until after 2000. This CSW will follow up via phone.   Care Team notified: Disposition CSW A[place], LCSW, Alfonso Ramus, RN, Youlanda Mighty, RN, Leitha Bleak, RN, Drue Dun, LPN, Sherren Mocha, Pratik Shah,DO   Maryjean Ka, MSW, [facility] 12/24/2022 7:53 PM

## 2022-12-24 NOTE — TOC Progression Note (Signed)
Transition of Care [facility]) - Progression Note    Patient Details  Name: Chelsea Gross MRN: [id number] Date of Birth: [birth year]  [facility] CM/SW Contact  Leitha Bleak, RN Phone Number: 12/24/2022, 10:40 AM  Clinical Narrative:   Para March from [facility] is considering patient. Needing statement that patient is medically cleared and medication. MD has completed DC summary. TOC faxed to [phone]. Jeanette with contact bedside RN with any other questions. RN number given.     Expected Discharge Plan: Psychiatric Hospital Barriers to Discharge: Continued Medical Work up  Expected Discharge Plan and Services      Living arrangements for the past 2 months: Skilled Nursing Facility Expected Discharge Date: 12/23/22                     Social Determinants of Health (SDOH) Interventions SDOH Screenings   Food Insecurity: No Food Insecurity (12/22/2022)  Housing: Low Risk  (12/22/2022)  Transportation Needs: No Transportation Needs (12/22/2022)  Utilities: Not At Risk (12/22/2022)  Depression (PHQ2-9): Medium Risk (12/02/2022)  Tobacco Use: Low Risk  (12/23/2022)    Readmission Risk Interventions    12/18/2022    9:44 AM  Readmission Risk Prevention Plan  Post Dischage Appt Not Complete  Medication Screening Complete  Transportation Screening Complete

## 2022-12-24 NOTE — Plan of Care (Signed)
  Problem: Acute Rehab PT Goals(only PT should resolve) Goal: Pt Will Go Supine/Side To Sit Outcome: Progressing Flowsheets (Taken 12/24/2022 1229) Pt will go Supine/Side to Sit:  Independently  with modified independence Goal: Patient Will Transfer Sit To/From Stand Outcome: Progressing Flowsheets (Taken 12/24/2022 1229) Patient will transfer sit to/from stand:  with modified independence  with supervision Goal: Pt Will Transfer Bed To Chair/Chair To Bed Outcome: Progressing Flowsheets (Taken 12/24/2022 1229) Pt will Transfer Bed to Chair/Chair to Bed:  with modified independence  with supervision Goal: Pt Will Ambulate Outcome: Progressing Flowsheets (Taken 12/24/2022 1229) Pt will Ambulate:  > 125 feet  with modified independence  with supervision  with rolling walker  with least restrictive assistive device   12:29 PM, 12/24/22 Ocie Bob, MPT Physical Therapist with [facility] [phone] office [phone] mobile phone

## 2022-12-24 NOTE — Progress Notes (Signed)
This CSW has made multiple attempts to follow up with Chelsea Gross [phone] in regards to PENDING acceptance. This CSW has left a HIPAA compliant voicemail requesting a phone call back from [facility] Intake. 1st shift CSW to follow up.    Maryjean Ka, MSW, LCSWA 12/24/2022 11:04 PM

## 2022-12-24 NOTE — Progress Notes (Signed)
Patient seen and evaluated this morning with no acute overnight events noted.  Laboratory data within normal limits and she is stable for discharge to behavioral health facility.  Please refer to discharge summary dictated 5/29 for full details.  Total care time: 15 minutes.

## 2022-12-25 DIAGNOSIS — K219 Gastro-esophageal reflux disease without esophagitis: Secondary | ICD-10-CM | POA: Diagnosis not present

## 2022-12-25 DIAGNOSIS — Z888 Allergy status to other drugs, medicaments and biological substances status: Secondary | ICD-10-CM | POA: Diagnosis not present

## 2022-12-25 DIAGNOSIS — B009 Herpesviral infection, unspecified: Secondary | ICD-10-CM | POA: Diagnosis not present

## 2022-12-25 DIAGNOSIS — F419 Anxiety disorder, unspecified: Secondary | ICD-10-CM | POA: Diagnosis not present

## 2022-12-25 DIAGNOSIS — R45851 Suicidal ideations: Secondary | ICD-10-CM | POA: Diagnosis not present

## 2022-12-25 DIAGNOSIS — Z882 Allergy status to sulfonamides status: Secondary | ICD-10-CM | POA: Diagnosis not present

## 2022-12-25 DIAGNOSIS — E119 Type 2 diabetes mellitus without complications: Secondary | ICD-10-CM | POA: Diagnosis not present

## 2022-12-25 DIAGNOSIS — Z8701 Personal history of pneumonia (recurrent): Secondary | ICD-10-CM | POA: Diagnosis not present

## 2022-12-25 DIAGNOSIS — Z9151 Personal history of suicidal behavior: Secondary | ICD-10-CM | POA: Diagnosis not present

## 2022-12-25 DIAGNOSIS — G4700 Insomnia, unspecified: Secondary | ICD-10-CM | POA: Diagnosis not present

## 2022-12-25 DIAGNOSIS — E785 Hyperlipidemia, unspecified: Secondary | ICD-10-CM | POA: Diagnosis not present

## 2022-12-25 DIAGNOSIS — Z780 Asymptomatic menopausal state: Secondary | ICD-10-CM | POA: Diagnosis not present

## 2022-12-25 DIAGNOSIS — Z7984 Long term (current) use of oral hypoglycemic drugs: Secondary | ICD-10-CM | POA: Diagnosis not present

## 2022-12-25 DIAGNOSIS — Z79899 Other long term (current) drug therapy: Secondary | ICD-10-CM | POA: Diagnosis not present

## 2022-12-25 DIAGNOSIS — E039 Hypothyroidism, unspecified: Secondary | ICD-10-CM | POA: Diagnosis not present

## 2022-12-25 DIAGNOSIS — F332 Major depressive disorder, recurrent severe without psychotic features: Secondary | ICD-10-CM | POA: Diagnosis not present

## 2022-12-25 DIAGNOSIS — I10 Essential (primary) hypertension: Secondary | ICD-10-CM | POA: Diagnosis not present

## 2022-12-25 LAB — GLUCOSE, CAPILLARY
Glucose-Capillary: 111 mg/dL — ABNORMAL HIGH (ref 70–99)
Glucose-Capillary: 105 mg/dL — ABNORMAL HIGH (ref 70–99)

## 2022-12-25 NOTE — Progress Notes (Signed)
Pt discharged in custody of [facility] Department deputy for transfer to [facility]. Pt's belongings in possession of deputy at time of discharge.

## 2022-12-25 NOTE — Progress Notes (Signed)
Pt report called to [facility] staff nurse. [facility] Dept aware of need for transportation to facility, cannot advise specific time. Pt updated, aware of pending transfer and wait for [employer]. Pt's daughter aware of pending transfer as well, she was given the number to the facility.

## 2022-12-25 NOTE — Progress Notes (Signed)
Patient slept some during the night, denies any suicidal thoughts.

## 2022-12-25 NOTE — Progress Notes (Signed)
Pt was accepted to [facility] 12/25/2022. Bed assignment: [facility]  Pt meets inpatient criteria per Liborio Nixon, NP  Attending Physician will be Novella Rob, NP  Report can be called to: [phone]  Pt can arrive after 1 PM  Care Team Notified: Maurilio Lovely, DO, Leitha Bleak, RN, Youlanda Mighty, RN, Alfonso Ramus, RN, and Drue Dun, LPN  [place], [place]  12/25/2022 8:12 AM

## 2022-12-25 NOTE — Progress Notes (Signed)
Chelsea Gross called and stated patient has been accepted the the facility by Dr. Hinda Lenis, call number for report is [phone], will pass this information along to day shift.

## 2022-12-25 NOTE — Plan of Care (Signed)
  Problem: Education: Goal: Knowledge of General Education information will improve Description: Including pain rating scale, medication(s)/side effects and non-pharmacologic comfort measures Outcome: Adequate for Discharge   Problem: Health Behavior/Discharge Planning: Goal: Ability to manage health-related needs will improve Outcome: Adequate for Discharge   Problem: Clinical Measurements: Goal: Ability to maintain clinical measurements within normal limits will improve Outcome: Adequate for Discharge Goal: Will remain free from infection Outcome: Adequate for Discharge Goal: Diagnostic test results will improve Outcome: Adequate for Discharge Goal: Respiratory complications will improve Outcome: Adequate for Discharge Goal: Cardiovascular complication will be avoided Outcome: Adequate for Discharge   Problem: Activity: Goal: Risk for activity intolerance will decrease Outcome: Adequate for Discharge   Problem: Nutrition: Goal: Adequate nutrition will be maintained Outcome: Adequate for Discharge   Problem: Coping: Goal: Level of anxiety will decrease Outcome: Adequate for Discharge   Problem: Elimination: Goal: Will not experience complications related to bowel motility Outcome: Adequate for Discharge Goal: Will not experience complications related to urinary retention Outcome: Adequate for Discharge   Problem: Pain Managment: Goal: General experience of comfort will improve Outcome: Adequate for Discharge   Problem: Safety: Goal: Ability to remain free from injury will improve Outcome: Adequate for Discharge   Problem: Skin Integrity: Goal: Risk for impaired skin integrity will decrease Outcome: Adequate for Discharge   Problem: Safety: Goal: Non-violent Restraint(s) Outcome: Adequate for Discharge   Problem: Acute Rehab PT Goals(only PT should resolve) Goal: Pt Will Go Supine/Side To Sit Outcome: Adequate for Discharge Goal: Patient Will Transfer Sit  To/From Stand Outcome: Adequate for Discharge Goal: Pt Will Transfer Bed To Chair/Chair To Bed Outcome: Adequate for Discharge Goal: Pt Will Ambulate Outcome: Adequate for Discharge

## 2023-01-01 ENCOUNTER — Other Ambulatory Visit: Payer: Self-pay | Admitting: Family Medicine

## 2023-01-01 DIAGNOSIS — F320 Major depressive disorder, single episode, mild: Secondary | ICD-10-CM

## 2023-01-01 DIAGNOSIS — F411 Generalized anxiety disorder: Secondary | ICD-10-CM

## 2023-01-06 DIAGNOSIS — F331 Major depressive disorder, recurrent, moderate: Secondary | ICD-10-CM | POA: Diagnosis not present

## 2023-03-05 ENCOUNTER — Encounter: Payer: Medicaid Other | Admitting: Family Medicine

## 2023-03-12 ENCOUNTER — Telehealth: Payer: Self-pay

## 2023-03-12 NOTE — Telephone Encounter (Signed)
Chelsea Gross (KeyHerma Mering) PA Case ID #: [id number] Need Help? Call us at [phone] Outcome Approved today by [facility] NCPDP 2017 Your PA request has been approved. Additional information will be provided in the approval communication. (Message 1145) Authorization Expiration Date: 03/11/2024 Drug Tretinoin 0.05% cream ePA cloud Psychologist, educational Electronic PA Form [phone] NCPDP)

## 2023-03-15 ENCOUNTER — Other Ambulatory Visit (HOSPITAL_COMMUNITY): Payer: Self-pay

## 2023-03-15 NOTE — Telephone Encounter (Signed)
Pharmacy Patient Advocate Encounter  Received notification from [facility] that Prior Authorization for Tretinoin 0.05% cream has been APPROVED from 03/12/23 to 03/11/24   PA #/Case ID/Reference #: [id number]

## 2023-03-26 ENCOUNTER — Telehealth: Payer: Self-pay | Admitting: *Deleted

## 2023-03-26 NOTE — Telephone Encounter (Signed)
Spoke with patient. Patient states she has been on Estradiol gel 0.25 mg for 3 months, no change in hot flashes. Patient is requesting to increase dosage. Pharmacy updated. Advised I will send to Dr. Edward Jolly to review and f/u with recommendations next week, patient agreeable.   Last AEX 09/04/22 -JJ  Dr. Edward Jolly -please review and advise.

## 2023-03-26 NOTE — Telephone Encounter (Signed)
Patient left message requesting new Rx for Estradiol 0.375 mg gel to [facility].    Reviewed last AEX 09/04/52 with JJ and MyChart encounter dated 10/20/22, no recommendations documented to increase dosage.    Call returned to patient, Left message to call [facility] Triage at [phone], option 4.

## 2023-03-30 MED ORDER — ESTRADIOL 0.5 MG/0.5GM TD GEL: 1.0000 | Freq: Every day | TRANSDERMAL | 1 refills | Status: DC

## 2023-03-30 NOTE — Telephone Encounter (Signed)
Spoke with patient, advised per Dr. Quincy Simmonds. Rx to verified pharmacy. Patient verbalizes understanding and is agreeable.   Encounter closed.

## 2023-03-30 NOTE — Telephone Encounter (Signed)
Patient can switch from Divigel 0.25 mg to Divigel 0.5 mg to skin daily.   Disp:  3 months, RF:  1.

## 2023-05-03 ENCOUNTER — Telehealth: Payer: Self-pay | Admitting: Family Medicine

## 2023-05-16 ENCOUNTER — Telehealth: Payer: Self-pay | Admitting: Family Medicine

## 2023-05-17 ENCOUNTER — Telehealth: Payer: Self-pay

## 2023-05-17 ENCOUNTER — Other Ambulatory Visit (HOSPITAL_COMMUNITY): Payer: Self-pay

## 2023-05-17 NOTE — Telephone Encounter (Signed)
Name from pharmacy: OMEPRAZOLE DR 20 MG CAPSULE  Pharmacy comment: Alternative Requested:NEEDS A PA WILL ONLY COVER 90 DAYS IN 365 DAY PERIOD OF TIME.

## 2023-05-17 NOTE — Telephone Encounter (Signed)
P/A has been submitted and is now pending     Pharmacy Patient Advocate Encounter    Received notification from Physician's Office that prior authorization for Omeprazole Magnesium 20MG  dr tablets   is required/requested.    Insurance verification completed.   The patient is insured through [facility] .    Per test claim: PA required; PA submitted to [facility] via CoverMyMeds Key/confirmation #/EOC Key: [id number]    Status is pending

## 2023-05-20 ENCOUNTER — Other Ambulatory Visit (HOSPITAL_COMMUNITY): Payer: Self-pay

## 2023-05-21 NOTE — Telephone Encounter (Signed)
Left vm for cb

## 2023-05-21 NOTE — Telephone Encounter (Signed)
Pharmacy Patient Advocate Encounter  Received notification from [facility] that Prior Authorization for Omeprazole Magnesium 20MG  dr tablets  has been DENIED.  Full denial letter will be uploaded to the media tab. See denial reason below.   PA #/Case ID/Reference #: [id number]   DENIAL REASON: Plan only covers 90 units of therapy per 365 days

## 2023-05-21 NOTE — Telephone Encounter (Signed)
Please inform pt. Will need to get OTC

## 2023-05-24 NOTE — Telephone Encounter (Signed)
Pharmacy Patient Advocate Encounter  Received notification from [employer] that Prior Authorization for Omeprazole has been DENIED.  Per the patients preferred pharmacy the pt has been using a Discount card as her insurance only covers 90tablets in  a 365day period. Cost is around/about 16.75

## 2023-05-29 ENCOUNTER — Other Ambulatory Visit: Payer: Self-pay | Admitting: Family Medicine

## 2023-05-29 DIAGNOSIS — F411 Generalized anxiety disorder: Secondary | ICD-10-CM

## 2023-05-29 DIAGNOSIS — F320 Major depressive disorder, single episode, mild: Secondary | ICD-10-CM

## 2023-05-30 ENCOUNTER — Other Ambulatory Visit: Payer: Self-pay | Admitting: Family Medicine

## 2023-05-30 DIAGNOSIS — E034 Atrophy of thyroid (acquired): Secondary | ICD-10-CM

## 2023-06-23 ENCOUNTER — Encounter: Payer: Self-pay | Admitting: Family Medicine

## 2023-06-23 ENCOUNTER — Other Ambulatory Visit: Payer: Self-pay | Admitting: Family Medicine

## 2023-06-23 DIAGNOSIS — E034 Atrophy of thyroid (acquired): Secondary | ICD-10-CM

## 2023-06-23 NOTE — Telephone Encounter (Signed)
Patient must be seen for any further refills. Patient has not had thyroid levels repeated from 05/13 visit

## 2023-06-23 NOTE — Telephone Encounter (Signed)
LMTCB TO SCHEDULE APPT Letter Mailed

## 2023-07-19 ENCOUNTER — Other Ambulatory Visit: Payer: Self-pay | Admitting: Obstetrics and Gynecology

## 2023-07-19 ENCOUNTER — Telehealth: Payer: Self-pay

## 2023-07-19 DIAGNOSIS — N950 Postmenopausal bleeding: Secondary | ICD-10-CM

## 2023-07-19 NOTE — Telephone Encounter (Signed)
Message sent to scheduling department to schedule u/s & visit with Dr Edward Jolly. I called & left patient a message on her voicemail letting her know that someone will be calling her. Please place u/s order.

## 2023-07-19 NOTE — Telephone Encounter (Signed)
Patient complains of post menopausal bleeding that started today. It very light but seems like it has stopped. She states she has had some cramping. Patient is using divigel 0.5mg  gel & taking provera 2.5mg  daily.  Routing to Dr Edward Jolly & covering provider Dr Karma Greaser for review.

## 2023-07-19 NOTE — Progress Notes (Signed)
Orders placed for pelvic ultrasound and possible endometrial biopsy for postmenopausal bleeding.

## 2023-07-19 NOTE — Telephone Encounter (Signed)
I placed orders for an office pelvic ultrasound and possible endometrial biopsy for further evaluation of postmenopausal bleeding.

## 2023-07-25 ENCOUNTER — Other Ambulatory Visit: Payer: Self-pay | Admitting: Family Medicine

## 2023-07-29 NOTE — Telephone Encounter (Signed)
 Per review of EPIC, patient is scheduled for PUS and EMB 09/07/23.   Encounter closed.

## 2023-08-17 ENCOUNTER — Other Ambulatory Visit: Payer: Self-pay | Admitting: Family Medicine

## 2023-08-17 DIAGNOSIS — E034 Atrophy of thyroid (acquired): Secondary | ICD-10-CM

## 2023-08-20 ENCOUNTER — Other Ambulatory Visit: Payer: Self-pay | Admitting: Family Medicine

## 2023-08-20 DIAGNOSIS — F411 Generalized anxiety disorder: Secondary | ICD-10-CM

## 2023-08-20 DIAGNOSIS — F320 Major depressive disorder, single episode, mild: Secondary | ICD-10-CM

## 2023-08-23 ENCOUNTER — Encounter: Payer: Self-pay | Admitting: Family Medicine

## 2023-08-23 ENCOUNTER — Ambulatory Visit (INDEPENDENT_AMBULATORY_CARE_PROVIDER_SITE_OTHER): Payer: Managed Care, Other (non HMO) | Admitting: Family Medicine

## 2023-08-23 VITALS — BP 120/72 | HR 84 | Temp 97.8°F | Ht 64.0 in

## 2023-08-23 DIAGNOSIS — E034 Atrophy of thyroid (acquired): Secondary | ICD-10-CM | POA: Diagnosis not present

## 2023-08-23 DIAGNOSIS — R7303 Prediabetes: Secondary | ICD-10-CM | POA: Diagnosis not present

## 2023-08-23 DIAGNOSIS — F411 Generalized anxiety disorder: Secondary | ICD-10-CM

## 2023-08-23 DIAGNOSIS — Z566 Other physical and mental strain related to work: Secondary | ICD-10-CM

## 2023-08-23 DIAGNOSIS — E66811 Obesity, class 1: Secondary | ICD-10-CM | POA: Diagnosis not present

## 2023-08-23 DIAGNOSIS — J329 Chronic sinusitis, unspecified: Secondary | ICD-10-CM

## 2023-08-23 DIAGNOSIS — F320 Major depressive disorder, single episode, mild: Secondary | ICD-10-CM

## 2023-08-23 DIAGNOSIS — E782 Mixed hyperlipidemia: Secondary | ICD-10-CM

## 2023-08-23 DIAGNOSIS — L700 Acne vulgaris: Secondary | ICD-10-CM

## 2023-08-23 LAB — BAYER DCA HB A1C WAIVED: HB A1C (BAYER DCA - WAIVED): 5.8 % — ABNORMAL HIGH (ref 4.8–5.6)

## 2023-08-23 MED ORDER — TRIAMCINOLONE ACETONIDE 55 MCG/ACT NA AERO
2.0000 | INHALATION_SPRAY | Freq: Every day | NASAL | 12 refills | Status: AC
Start: 2023-08-23 — End: ?

## 2023-08-23 MED ORDER — ATORVASTATIN CALCIUM 40 MG PO TABS
40.0000 mg | ORAL_TABLET | Freq: Every day | ORAL | 3 refills | Status: AC
Start: 2023-08-23 — End: ?

## 2023-08-23 MED ORDER — DULOXETINE HCL 60 MG PO CPEP
60.0000 mg | ORAL_CAPSULE | Freq: Every day | ORAL | 3 refills | Status: AC
Start: 2023-08-23 — End: ?

## 2023-08-23 MED ORDER — LEVOTHYROXINE SODIUM 25 MCG PO TABS
37.5000 ug | ORAL_TABLET | Freq: Every day | ORAL | 3 refills | Status: DC
Start: 2023-08-23 — End: 2023-09-16

## 2023-08-23 MED ORDER — TRETINOIN 0.025 % EX CREA
TOPICAL_CREAM | Freq: Every day | CUTANEOUS | 3 refills | Status: AC
Start: 2023-08-23 — End: ?

## 2023-08-23 MED ORDER — METOPROLOL SUCCINATE ER 25 MG PO TB24: 25.0000 mg | ORAL_TABLET | Freq: Every day | ORAL | 3 refills | Status: DC

## 2023-08-23 MED ORDER — METFORMIN HCL ER 500 MG PO TB24
500.0000 mg | ORAL_TABLET | Freq: Every day | ORAL | 3 refills | Status: AC
Start: 2023-08-23 — End: ?

## 2023-08-23 MED ORDER — DESLORATADINE 5 MG PO TABS
5.0000 mg | ORAL_TABLET | Freq: Every day | ORAL | 3 refills | Status: AC
Start: 2023-08-23 — End: ?

## 2023-08-23 NOTE — Addendum Note (Signed)
Addended by: Raliegh Ip on: 08/23/2023 04:04 PM   Modules accepted: Orders

## 2023-08-23 NOTE — Progress Notes (Addendum)
 Subjective: CC: Chronic follow-up for med refills PCP: Eliodoro Guerin, DO NWG:NFAOZ Francis Poinsett is a 58 y.o. female presenting to clinic today for:  1.  Anxiety, depression Patient reports a little bit more stress due to recent work change.  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  The adjustment there has been a little difficult but overall she seems to be doing okay.  She is compliant with her medications and needs refills  2.  Hypothyroidism Patient reports no concerning side effects of medication.  3.  Hyperlipidemia associated with obesity.   Compliant with Lipitor, metoprolol .  Has prediabetes and is compliant with metformin .  Needs refills  4.  Rhinosinusitis She reports that she has been having some nasal symptoms.  This has been ongoing for the last couple of days.  No reported fevers, myalgia, hemoptysis, shortness of breath or wheezing.  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 X10-14 days to left elbow, Disp: 30 g, Rfl: 0   DULoxetine  (CYMBALTA ) 60 MG capsule, TAKE 1 CAPSULE BY MOUTH EVERY DAY, Disp: 90  capsule, Rfl: 0   Estradiol  (DIVIGEL ) 0.5 MG/0.5GM GEL, Place 1 packet onto the skin daily. Apply to lower abdomen or upper thigh., Disp: 90 each, Rfl: 1   feeding supplement (ENSURE ENLIVE / ENSURE PLUS) LIQD, Take 237 mLs by mouth 2 (two) times daily between meals., Disp: 237 mL, Rfl: 12   levothyroxine  (SYNTHROID ) 25 MCG tablet, Take 1.5 tablets (37.5 mcg total) by mouth daily before breakfast., Disp: 45 tablet, Rfl: 0   medroxyPROGESTERone  (PROVERA ) 2.5 MG tablet, Take 1 tablet (2.5 mg total) by mouth daily., Disp: 90 tablet, Rfl: 3   metFORMIN  (GLUCOPHAGE  XR) 500 MG 24 hr tablet, Take 1 tablet (500 mg total) by mouth daily with breakfast., Disp: 90 tablet, Rfl: 3   metoprolol  succinate (TOPROL -XL) 25 MG 24 hr tablet, Take 1 tablet (25 mg total) by mouth daily., Disp: 90 tablet, Rfl: 3   omeprazole  (PRILOSEC) 20 MG capsule, Take 1 capsule (20 mg total) by mouth daily., Disp: 90 capsule, Rfl: 3   tretinoin  (RETIN-A ) 0.05 % cream, Apply topically at bedtime., Disp: 45 g, Rfl: 3   valACYclovir  (VALTREX ) 500 MG tablet, TAKE ONE TABLET BY MOUTH ONCE A DAY, INCREASE TO ONE TABLET TWO TIMES A DAY FOR 3 DAYS WITH AN OUTBREAK, Disp: 90 tablet, Rfl: 4   Vitamin D , Ergocalciferol , (DRISDOL) 1.25 MG (50000 UNIT) CAPS capsule, Take 50,000 Units by mouth every 7 (seven) days., Disp: , Rfl:  Social History   Socioeconomic History   Marital status: Married    Spouse name: Not on  file   Number of children: Not on file   Years of education: Not on file   Highest education level: Not on file  Occupational History   Occupation: CMA  Tobacco Use   Smoking status: Never   Smokeless tobacco: Never  Vaping Use   Vaping status: Never Used  Substance and Sexual Activity   Alcohol use: Yes    Alcohol/week: 0.0 standard drinks of alcohol    Comment: RARE   Drug use: Yes    Comment: cannabis pills or gummies rarely LAST USED  2020 PER PT   Sexual activity: Yes    Partners: Male    Birth control/protection:  None  Other Topics Concern   Not on file  Social History Narrative   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 [place]   Social Drivers of Health   Financial Resource Strain: Not on file  Food Insecurity: No Food Insecurity (12/22/2022)   Hunger Vital Sign    Worried About Running Out of Food in the Last Year: Never true    Ran Out of Food in the Last Year: Never true  Transportation Needs: No Transportation Needs (12/22/2022)   PRAPARE - Administrator, Civil Service (Medical): No    Lack of Transportation (Non-Medical): No  Physical Activity: Not on file  Stress: Not on file  Social Connections: Not on file  Intimate Partner Violence: Not on file   Family History  Problem Relation Age of Onset   Heart attack Mother    Esophageal cancer Mother    Heart attack Father    Hyperlipidemia Sister    Thyroid  disease Sister    Diabetes Mellitus II Sister    Diabetes Mellitus II Sister    Hyperlipidemia Sister    Diabetes Mellitus II Sister    Hyperlipidemia Sister    Hyperlipidemia Sister     Objective: Office vital signs reviewed. BP 120/72   Pulse 84   Temp 97.8 F (36.6 C)   Ht 5\' 4"  (1.626 m)   LMP 04/16/2020   SpO2 97%   BMI 33.60 kg/m   Physical Examination:  General: Awake, alert, well nourished, No acute distress HEENT: sclera white, MMM.  No exophthalmos.  TMs intact bilaterally with normal light reflex.  No purulence in nares but she does have edema and erythema of the nasal turbinates.  Oropharynx without exudates Cardio: regular rate and rhythm, S1S2 heard, no murmurs appreciated Pulm: clear to auscultation bilaterally, no wheezes, rhonchi or rales; normal work of breathing on room air Neuro: No observed tremor     08/23/2023    1:27 PM 12/02/2022   11:29 AM 08/19/2021    2:40 PM  Depression screen PHQ 2/9  Decreased Interest 1 1 1   Down, Depressed, Hopeless 1 0 1  PHQ - 2 Score 2 1 2   Altered sleeping 0 2 1  Tired, decreased energy 0 0 1  Change in  appetite 0 0 0  Feeling bad or failure about yourself  1 2 1   Trouble concentrating 1 0 1  Moving slowly or fidgety/restless 0 0 0  Suicidal thoughts 0 0 0  PHQ-9 Score 4 5 6   Difficult doing work/chores Somewhat difficult Not difficult at all Not difficult at all      08/23/2023    1:26 PM 12/02/2022   11:30 AM 08/19/2021    2:41 PM 01/16/2021    3:03 PM  GAD 7 : Generalized Anxiety Score  Nervous, Anxious, on Edge 1 1 0  2  Control/stop worrying 1 1 1 1   Worry too much - different things 2 1 1 1   Trouble relaxing 1 1 1 1   Restless 1 0 1 1  Easily annoyed or irritable 1 0 1 1  Afraid - awful might happen 1 1 0 1  Total GAD 7 Score 8 5 5 8   Anxiety Difficulty Somewhat difficult Not difficult at all Not difficult at all Somewhat difficult      Assessment/ Plan: 58 y.o. female   Hypothyroidism due to acquired atrophy of thyroid  - Plan: levothyroxine  (SYNTHROID ) 25 MCG tablet, TSH + free T4  Obesity (BMI 30.0-34.9)  Pre-diabetes - Plan: metFORMIN  (GLUCOPHAGE  XR) 500 MG 24 hr tablet, Bayer DCA Hb A1c Waived  Mixed hyperlipidemia - Plan: atorvastatin  (LIPITOR) 40 MG tablet  Current mild episode of major depressive disorder without prior episode (HCC) - Plan: DULoxetine  (CYMBALTA ) 60 MG capsule  Generalized anxiety disorder - Plan: DULoxetine  (CYMBALTA ) 60 MG capsule  Stress at work  Acne vulgaris - Plan: tretinoin  (RETIN-A ) 0.025 % cream  Rhinosinusitis - Plan: desloratadine  (CLARINEX ) 5 MG tablet, triamcinolone  (NASACORT ) 55 MCG/ACT AERO nasal inhaler  Clinically euthymic.  Check thyroid  levels.  Synthroid  renewed  Prediabetic.  Continue metformin .  A1c collected  Lipitor renewed.  Not due for fasting lipid panel  Cymbalta  renewed.  Symptoms are chronic and slightly exacerbated with new work environment. Retin A reduced 0.025 mg as a 0.05 mg is too harsh  Clarinex  and Nasacort  sent for rhinosinusitis.  Suspect this is viral versus allergic in nature.  Discussed signs  and symptoms of secondary bacterial and she will contact me if she develops any of these   Eliodoro Guerin, DO [facility] [phone]

## 2023-08-24 ENCOUNTER — Encounter: Payer: Self-pay | Admitting: Obstetrics and Gynecology

## 2023-08-24 LAB — TSH+FREE T4
Free T4: 1.02 ng/dL (ref 0.82–1.77)
TSH: 4.25 u[IU]/mL (ref 0.450–4.500)

## 2023-08-24 NOTE — Progress Notes (Addendum)
 GYNECOLOGY  VISIT   HPI: 58 y.o.   Married  Caucasian female   G1P1001 with Patient's last menstrual period was 04/16/2020.   here for: PUS, possible endometrial biopsy.      Patient had had postmenopausal bleeding on HRT in December.  Had just a small amount of mucusy bleeding and then it stopped.  No pain.  No recent intercourse prior to the bleeding.  She uses Divigel 0.5 mg to skin daily and takes Provera 2.5 mg daily.  Hot flashes are well controlled.  She likes taking her HRT.   Hx hysteroscopy with dilation and curettage 06/18/20 with Dr. Oscar La.  Known cervical stenosis.  Final pathology:  benign endometrial polyp.  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   GYNECOLOGIC HISTORY: Patient's last menstrual period was 04/16/2020. Contraception:  PMP Menopausal hormone therapy:  divigel and provera Last 2 paps:  04/09/21 neg: HR HPV neg History of abnormal Pap or positive HPV:  no Mammogram:  09/03/22 Breast Density Cat A, BI-RADS CAT 1 neg.  Has appointment.         OB History     Gravida  1   Para  1   Term  1   Preterm      AB      Living  1      SAB      IAB      Ectopic      Multiple      Live Births  1              Patient Active Problem List   Diagnosis Date Noted   Acute metabolic encephalopathy 12/17/2022   Intentional benzodiazepine overdose (HCC) 12/17/2022   CNS depression (HCC) 12/17/2022   Benzodiazepine overdose, intentional self-harm, initial encounter (HCC) 12/17/2022   Overdose 12/16/2022   Class 1 obesity in adult 09/24/2022   Guaiac positive stools 09/24/2022   Prediabetes 09/18/2021   Actinic keratosis 04/09/2021   Inappropriate sinus tachycardia (HCC) 05/10/2020   Preoperative cardiovascular examination 05/10/2020   Anxiety    GERD (gastroesophageal reflux disease)    History of palpitations    Hypothyroid    PMB (postmenopausal bleeding)    Thyroid disease    Urinary incontinence    Palpitations  10/13/2019   Chest discomfort 10/13/2019   Generalized anxiety disorder 09/21/2018   Controlled substance agreement signed 09/21/2018   Depression 04/16/2016   Hyperlipidemia 04/16/2016   Hypothyroidism 04/16/2016    Past Medical History:  Diagnosis Date   Chest discomfort 10/13/2019   Depression    Generalized anxiety disorder    GERD (gastroesophageal reflux disease)    History of palpitations    MARCH 2021   Hyperlipidemia 04/16/2016   Hypothyroidism    Inappropriate sinus tachycardia (HCC) 05/10/2020   Palpitations 10/13/2019   PMB (postmenopausal bleeding)    Preoperative cardiovascular examination 05/10/2020   Suicidal behavior with attempted self-injury [facility])    Urinary incontinence     Past Surgical History:  Procedure Laterality Date   chin and lip surgery  2001   COLONSCOPY  2018   5 POLYPS REMOVED   DILATATION & CURETTAGE/HYSTEROSCOPY WITH MYOSURE N/A 06/18/2020   Procedure: DILATATION & CURETTAGE/HYSTEROSCOPY WITH MYOSURE;  Surgeon: Romualdo Bolk, MD;  Location: [facility];  Service: Gynecology;  Laterality: N/A;   labial repair  2000    Current Outpatient Medications  Medication Sig Dispense Refill   atorvastatin (LIPITOR) 40 MG tablet Take 1 tablet (40 mg  total) by mouth daily. 90 tablet 3   desloratadine (CLARINEX) 5 MG tablet Take 1 tablet (5 mg total) by mouth daily. 90 tablet 3   DULoxetine (CYMBALTA) 60 MG capsule Take 1 capsule (60 mg total) by mouth daily. 90 capsule 3   Estradiol (DIVIGEL) 0.5 MG/0.5GM GEL Place onto the skin. Place 1 packet onto the skin daily. Apply to lower abdomen or upper thigh     levothyroxine (SYNTHROID) 25 MCG tablet Take 1.5 tablets (37.5 mcg total) by mouth daily before breakfast. 135 tablet 3   medroxyPROGESTERone (PROVERA) 2.5 MG tablet Take 1 tablet (2.5 mg total) by mouth daily. 90 tablet 3   metFORMIN (GLUCOPHAGE XR) 500 MG 24 hr tablet Take 1 tablet (500 mg total) by mouth daily with breakfast.  90 tablet 3   metoprolol succinate (TOPROL-XL) 25 MG 24 hr tablet Take 1 tablet (25 mg total) by mouth daily. 90 tablet 3   omeprazole (PRILOSEC) 20 MG capsule Take 1 capsule (20 mg total) by mouth daily. 90 capsule 3   tretinoin (RETIN-A) 0.025 % cream Apply topically at bedtime. 45 g 3   triamcinolone (NASACORT) 55 MCG/ACT AERO nasal inhaler Place 2 sprays into the nose daily. 1 each 12   valACYclovir (VALTREX) 500 MG tablet TAKE ONE TABLET BY MOUTH ONCE A DAY, INCREASE TO ONE TABLET TWO TIMES A DAY FOR 3 DAYS WITH AN OUTBREAK 90 tablet 4   No current facility-administered medications for this visit.     ALLERGIES: Sulfa antibiotics, Wellbutrin [bupropion], and Pantoprazole  Family History  Problem Relation Age of Onset   Heart attack Mother    Esophageal cancer Mother    Heart attack Father    Hyperlipidemia Sister    Thyroid disease Sister    Diabetes Mellitus II Sister    Diabetes Mellitus II Sister    Hyperlipidemia Sister    Diabetes Mellitus II Sister    Hyperlipidemia Sister    Hyperlipidemia Sister     Social History   Socioeconomic History   Marital status: Married    Spouse name: Not on file   Number of children: Not on file   Years of education: Not on file   Highest education level: Not on file  Occupational History   Occupation: CMA  Tobacco Use   Smoking status: Never   Smokeless tobacco: Never  Vaping Use   Vaping status: Never Used  Substance and Sexual Activity   Alcohol use: Yes    Alcohol/week: 0.0 standard drinks of alcohol    Comment: RARE   Drug use: Yes    Comment: cannabis pills or gummies rarely LAST USED  2020 PER PT   Sexual activity: Yes    Partners: Male    Birth control/protection: Post-menopausal  Other Topics Concern   Not on file  Social History Narrative   Works for [employer] in [place]   Social Drivers of Health   Financial Resource Strain: Not on file  Food Insecurity: No Food Insecurity (12/22/2022)   Hunger Vital  Sign    Worried About Running Out of Food in the Last Year: Never true    Ran Out of Food in the Last Year: Never true  Transportation Needs: No Transportation Needs (12/22/2022)   PRAPARE - Administrator, Civil Service (Medical): No    Lack of Transportation (Non-Medical): No  Physical Activity: Not on file  Stress: Not on file  Social Connections: Not on file  Intimate Partner Violence: Not on  file    Review of Systems  All other systems reviewed and are negative.   PHYSICAL EXAMINATION:   BP 128/84 (BP Location: Left Arm, Patient Position: Sitting, Cuff Size: Small)   Ht 5\' 4"  (1.626 m)   Wt 195 lb (88.5 kg)   LMP 04/16/2020   BMI 33.47 kg/m     General appearance: alert, cooperative and appears stated age  Pelvic: External genitalia:  no lesions              Urethra:  normal appearing urethra with no masses, tenderness or lesions              Bartholins and Skenes: normal                 Vagina: normal appearing vagina with normal color and discharge, no lesions              Cervix: no lesions                Bimanual Exam:  Uterus:  normal size, contour, position, consistency, mobility, non-tender              Adnexa: no mass, fullness, tenderness            Chaperone was present for exam:  Warren Lacy, CMA  Pelvic US Uterus 8.35 x 4.89 x 3.47 cm.   Retroverted.  2 fibroids:  1.66 cm in cervix and 0.61 cm in posterior subserosal in LUS  EMS 1.72 mm. Left ovary 1.99 x 1.11 x 141 cm.  Right ovary 1.79 x 0.98 x 1.29 cm.  No adnexal masses.  Small amount of free fluid in cul de sac.    ASSESSMENT:  Postmenopausal bleeding on HRT.   Resolved.   Uterine fibroids.   PLAN:  [place] images and report reviewed.  Fibroids discussed. No EMB needed.  Reassurance given.  Risks and benefits of HRT discussed:  increased risk or breast cancer, stroke, MI, DVT, and PE.  Patient wishes to continue HRT. Annual exam in about 3 months.   30 min total time was spent for  this patient encounter, including preparation, face-to-face counseling with the patient, coordination of care, and documentation of the encounter.

## 2023-08-31 ENCOUNTER — Encounter: Payer: Self-pay | Admitting: Family Medicine

## 2023-09-07 ENCOUNTER — Ambulatory Visit: Payer: Managed Care, Other (non HMO) | Admitting: Obstetrics and Gynecology

## 2023-09-07 ENCOUNTER — Encounter: Payer: Self-pay | Admitting: Obstetrics and Gynecology

## 2023-09-07 ENCOUNTER — Ambulatory Visit: Payer: Managed Care, Other (non HMO)

## 2023-09-07 VITALS — BP 128/84 | Ht 64.0 in | Wt 195.0 lb

## 2023-09-07 DIAGNOSIS — N950 Postmenopausal bleeding: Secondary | ICD-10-CM | POA: Diagnosis not present

## 2023-09-07 DIAGNOSIS — D219 Benign neoplasm of connective and other soft tissue, unspecified: Secondary | ICD-10-CM | POA: Diagnosis not present

## 2023-09-07 DIAGNOSIS — Z79890 Hormone replacement therapy (postmenopausal): Secondary | ICD-10-CM | POA: Diagnosis not present

## 2023-09-07 DIAGNOSIS — Z8742 Personal history of other diseases of the female genital tract: Secondary | ICD-10-CM

## 2023-09-08 ENCOUNTER — Other Ambulatory Visit: Payer: Self-pay | Admitting: Family Medicine

## 2023-09-08 DIAGNOSIS — Z1231 Encounter for screening mammogram for malignant neoplasm of breast: Secondary | ICD-10-CM

## 2023-09-12 ENCOUNTER — Other Ambulatory Visit: Payer: Self-pay | Admitting: Family Medicine

## 2023-09-12 DIAGNOSIS — E034 Atrophy of thyroid (acquired): Secondary | ICD-10-CM

## 2023-09-13 ENCOUNTER — Encounter: Payer: Self-pay | Admitting: Family Medicine

## 2023-09-16 ENCOUNTER — Other Ambulatory Visit: Payer: Self-pay | Admitting: Family Medicine

## 2023-09-16 DIAGNOSIS — E034 Atrophy of thyroid (acquired): Secondary | ICD-10-CM

## 2023-09-16 NOTE — Telephone Encounter (Signed)
Alternative Requested:MYLAN IS ON BACK ORDER, NEED APPROVAL FOR GENERIC MANUFACTURER CHANGE.   Please review and advise

## 2023-09-21 ENCOUNTER — Other Ambulatory Visit: Payer: Self-pay | Admitting: Obstetrics and Gynecology

## 2023-09-21 NOTE — Telephone Encounter (Signed)
 Medication refill request: estradiol 0.5mg  gel Last AEX:  09-24-22 Next AEX: not scheduled. Message sent to scheduling department to call her to schedule Last MMG (if hormonal medication request): 09-03-22 birads 1:neg Refill authorized: please approve if appropriate

## 2023-09-29 ENCOUNTER — Encounter: Payer: Self-pay | Admitting: Family Medicine

## 2023-10-05 DIAGNOSIS — Z1231 Encounter for screening mammogram for malignant neoplasm of breast: Secondary | ICD-10-CM

## 2023-10-18 ENCOUNTER — Encounter (INDEPENDENT_AMBULATORY_CARE_PROVIDER_SITE_OTHER): Payer: Self-pay | Admitting: Family Medicine

## 2023-10-18 DIAGNOSIS — F411 Generalized anxiety disorder: Secondary | ICD-10-CM

## 2023-10-18 DIAGNOSIS — F320 Major depressive disorder, single episode, mild: Secondary | ICD-10-CM | POA: Diagnosis not present

## 2023-10-18 MED ORDER — DULOXETINE HCL 30 MG PO CPEP
30.0000 mg | ORAL_CAPSULE | Freq: Every day | ORAL | 3 refills | Status: AC
Start: 2023-10-18 — End: ?

## 2023-10-18 MED ORDER — BUSPIRONE HCL 5 MG PO TABS
5.0000 mg | ORAL_TABLET | Freq: Two times a day (BID) | ORAL | 0 refills | Status: DC
Start: 2023-10-18 — End: 2023-11-09

## 2023-10-18 NOTE — Telephone Encounter (Signed)
Please see the MyChart message reply(ies) for my assessment and plan.    This patient gave consent for this Medical Advice Message and is aware that it may result in a bill to their insurance company, as well as the possibility of receiving a bill for a co-payment or deductible. They are an established patient, but are not seeking medical advice exclusively about a problem treated during an in person or video visit in the last seven days. I did not recommend an in person or video visit within seven days of my reply.   I spent a total of 9 minutes cumulative time within 7 days through MyChart messaging.  Karime Scheuermann, DO   

## 2023-10-21 ENCOUNTER — Other Ambulatory Visit: Payer: Self-pay | Admitting: Obstetrics and Gynecology

## 2023-10-21 NOTE — Telephone Encounter (Signed)
 Medication refill request: estradiol gel.  Last AEX:  09/24/22 Next AEX: not schedule was seen for PMB 09/07/23 Last MMG (if hormonal medication request): 09/03/22 Refill authorized: please advise

## 2023-10-25 ENCOUNTER — Encounter: Payer: Self-pay | Admitting: Family Medicine

## 2023-10-29 ENCOUNTER — Ambulatory Visit

## 2023-10-29 ENCOUNTER — Ambulatory Visit
Admission: RE | Admit: 2023-10-29 | Discharge: 2023-10-29 | Disposition: A | Discharge status: Home / Self Care | Payer: Self-pay | Source: Ambulatory Visit | Attending: Family Medicine | Admitting: Family Medicine

## 2023-10-29 DIAGNOSIS — Z1231 Encounter for screening mammogram for malignant neoplasm of breast: Secondary | ICD-10-CM

## 2023-11-09 ENCOUNTER — Other Ambulatory Visit: Payer: Self-pay | Admitting: Family Medicine

## 2023-11-09 DIAGNOSIS — F320 Major depressive disorder, single episode, mild: Secondary | ICD-10-CM

## 2023-11-09 DIAGNOSIS — F411 Generalized anxiety disorder: Secondary | ICD-10-CM

## 2023-11-16 ENCOUNTER — Other Ambulatory Visit: Payer: Self-pay | Admitting: Obstetrics and Gynecology

## 2023-11-16 DIAGNOSIS — Z79890 Hormone replacement therapy (postmenopausal): Secondary | ICD-10-CM

## 2023-11-16 NOTE — Telephone Encounter (Signed)
 Med refill request: estradiol  gel Last OV: 09/07/23 ultrasound consult Last AEX: 09/24/22 Next AEX: none scheduled Staff message sent to apt. Schedulers to make annual exam appointment Last MMG (if hormonal med) 10/29/23 BI-RADS 1 negative Refill authorized: estradiol  gel, needs appointment. Please approve or deny as appropriate.

## 2023-11-22 ENCOUNTER — Encounter: Payer: Self-pay | Admitting: Family Medicine

## 2023-11-22 ENCOUNTER — Ambulatory Visit: Admitting: Family Medicine

## 2023-11-22 VITALS — BP 132/76 | HR 98 | Temp 98.7°F | Ht 64.0 in | Wt 208.0 lb

## 2023-11-22 DIAGNOSIS — Z713 Dietary counseling and surveillance: Secondary | ICD-10-CM | POA: Diagnosis not present

## 2023-11-22 DIAGNOSIS — R7303 Prediabetes: Secondary | ICD-10-CM

## 2023-11-22 DIAGNOSIS — Z6835 Body mass index (BMI) 35.0-35.9, adult: Secondary | ICD-10-CM | POA: Insufficient documentation

## 2023-11-22 DIAGNOSIS — E782 Mixed hyperlipidemia: Secondary | ICD-10-CM

## 2023-11-22 DIAGNOSIS — L719 Rosacea, unspecified: Secondary | ICD-10-CM

## 2023-11-22 MED ORDER — ZEPBOUND 5 MG/0.5ML ~~LOC~~ SOAJ
5.0000 mg | SUBCUTANEOUS | 0 refills | Status: DC
Start: 2023-11-22 — End: 2023-12-13

## 2023-11-22 MED ORDER — ZEPBOUND 2.5 MG/0.5ML ~~LOC~~ SOAJ
2.5000 mg | SUBCUTANEOUS | 0 refills | Status: DC
Start: 2023-11-22 — End: 2023-12-13

## 2023-11-22 MED ORDER — ZEPBOUND 7.5 MG/0.5ML ~~LOC~~ SOAJ
7.5000 mg | SUBCUTANEOUS | 1 refills | Status: DC
Start: 2023-11-22 — End: 2023-12-13

## 2023-11-22 MED ORDER — METRONIDAZOLE 1 % EX GEL
Freq: Every day | CUTANEOUS | 3 refills | Status: AC
Start: 2023-11-22 — End: ?

## 2023-11-22 NOTE — Addendum Note (Signed)
 Addended by: Eliodoro Guerin on: 11/22/2023 02:40 PM   Modules accepted: Orders

## 2023-11-22 NOTE — Progress Notes (Addendum)
 Subjective: BJ:YNWGNF loss PCP: Eliodoro Guerin, DO AOZ:HYQMV Nakisa Krzemien is a 58 y.o. female presenting to clinic today for:  1. Obesity associated with PREDm, and HLD She has tried and failed lifestyle modification, exercise programs and various OTC diets.  However, she notes that most of her weight occurred after she went through menopause once.  She just feels uncomfortable in her close.  She worries about progression of her prediabetes to diabetes given strong family history of this.  She is compliant with her statin, metoprolol  as prescribed  2.  Rosacea She was told by one of her family members in [place]  that they were using ivermectin topically for rosacea.  She wanted inquire about this.  Currently utilizing low-dose tretinoin  0.025%.   ROS: Per HPI  Allergies  Allergen Reactions   Sulfa Antibiotics Nausea Only   Wellbutrin  [Bupropion ]     HEART PAPITATIONS   Pantoprazole  Rash   Past Medical History:  Diagnosis Date   Chest discomfort 10/13/2019   Depression    Generalized anxiety disorder    GERD (gastroesophageal reflux disease)    History of palpitations    MARCH 2021   Hyperlipidemia 04/16/2016   Hypothyroidism    Inappropriate sinus tachycardia (HCC) 05/10/2020   Palpitations 10/13/2019   PMB (postmenopausal bleeding)    Preoperative cardiovascular examination 05/10/2020   Suicidal behavior with attempted self-injury (HCC)    Urinary incontinence     Current Outpatient Medications:    atorvastatin  (LIPITOR) 40 MG tablet, Take 1 tablet (40 mg total) by mouth daily., Disp: 90 tablet, Rfl: 3   busPIRone  (BUSPAR ) 5 MG tablet, TAKE 1 TABLET (5 MG TOTAL) BY MOUTH 2 (TWO) TIMES DAILY. FOR ANXIETY, Disp: 180 tablet, Rfl: 0   desloratadine  (CLARINEX ) 5 MG tablet, Take 1 tablet (5 mg total) by mouth daily., Disp: 90 tablet, Rfl: 3   DULoxetine  (CYMBALTA ) 30 MG capsule, Take 1 capsule (30 mg total) by mouth daily. Take with 60mg  to equal 90mg  daily., Disp:  90 capsule, Rfl: 3   DULoxetine  (CYMBALTA ) 60 MG capsule, Take 1 capsule (60 mg total) by mouth daily., Disp: 90 capsule, Rfl: 3   Estradiol  0.5 MG/0.5GM GEL, PLACE 1 PACKET ONTO THE SKIN DAILY. APPLY TO LOWER ABDOMEN OR UPPER THIGH., Disp: 30 each, Rfl: 0   levothyroxine  (SYNTHROID ) 25 MCG tablet, TAKE 1.5 TABLETS (37.5 MCG TOTAL) BY MOUTH DAILY BEFORE BREAKFAST., Disp: 135 tablet, Rfl: 3   medroxyPROGESTERone  (PROVERA ) 2.5 MG tablet, Take 1 tablet (2.5 mg total) by mouth daily., Disp: 90 tablet, Rfl: 3   metFORMIN  (GLUCOPHAGE  XR) 500 MG 24 hr tablet, Take 1 tablet (500 mg total) by mouth daily with breakfast., Disp: 90 tablet, Rfl: 3   metoprolol  succinate (TOPROL -XL) 25 MG 24 hr tablet, Take 1 tablet (25 mg total) by mouth daily., Disp: 90 tablet, Rfl: 3   omeprazole  (PRILOSEC) 20 MG capsule, Take 1 capsule (20 mg total) by mouth daily., Disp: 90 capsule, Rfl: 3   tretinoin  (RETIN-A ) 0.025 % cream, Apply topically at bedtime., Disp: 45 g, Rfl: 3   triamcinolone  (NASACORT ) 55 MCG/ACT AERO nasal inhaler, Place 2 sprays into the nose daily., Disp: 1 each, Rfl: 12   valACYclovir  (VALTREX ) 500 MG tablet, TAKE ONE TABLET BY MOUTH ONCE A DAY, INCREASE TO ONE TABLET TWO TIMES A DAY FOR 3 DAYS WITH AN OUTBREAK, Disp: 90 tablet, Rfl: 4 Social History   Socioeconomic History   Marital status: Married    Spouse name: Not on file  Number of children: Not on file   Years of education: Not on file   Highest education level: Not on file  Occupational History   Occupation: CMA  Tobacco Use   Smoking status: Never   Smokeless tobacco: Never  Vaping Use   Vaping status: Never Used  Substance and Sexual Activity   Alcohol use: Yes    Alcohol/week: 0.0 standard drinks of alcohol    Comment: RARE   Drug use: Yes    Comment: cannabis pills or gummies rarely LAST USED  2020 PER PT   Sexual activity: Yes    Partners: Male    Birth control/protection: Post-menopausal  Other Topics Concern   Not on file   Social History Narrative   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 [place]   Social Drivers of Health   Financial Resource Strain: Not on file  Food Insecurity: No Food Insecurity (12/22/2022)   Hunger Vital Sign    Worried About Running Out of Food in the Last Year: Never true    Ran Out of Food in the Last Year: Never true  Transportation Needs: No Transportation Needs (12/22/2022)   PRAPARE - Administrator, Civil Service (Medical): No    Lack of Transportation (Non-Medical): No  Physical Activity: Not on file  Stress: Not on file  Social Connections: Not on file  Intimate Partner Violence: Not on file   Family History  Problem Relation Age of Onset   Heart attack Mother    Esophageal cancer Mother    Heart attack Father    Hyperlipidemia Sister    Thyroid  disease Sister    Diabetes Mellitus II Sister    Diabetes Mellitus II Sister    Hyperlipidemia Sister    Diabetes Mellitus II Sister    Hyperlipidemia Sister    Hyperlipidemia Sister     Objective: Office vital signs reviewed. BP 132/76   Pulse 98   Temp 98.7 F (37.1 C)   Ht 5\' 4"  (1.626 m)   Wt 208 lb (94.3 kg)   LMP 04/16/2020   SpO2 98%   BMI 35.70 kg/m   Physical Examination:  General: Awake, alert, morbidly obese, No acute distress Skin: Minimal erythema but no pustules appreciated     11/22/2023   10:28 AM 08/23/2023    1:27 PM 12/02/2022   11:29 AM  Depression screen PHQ 2/9  Decreased Interest 1 1 1   Down, Depressed, Hopeless 1 1 0  PHQ - 2 Score 2 2 1   Altered sleeping 0 0 2  Tired, decreased energy 1 0 0  Change in appetite 0 0 0  Feeling bad or failure about yourself  1 1 2   Trouble concentrating 1 1 0  Moving slowly or fidgety/restless 0 0 0  Suicidal thoughts 0 0 0  PHQ-9 Score 5 4 5   Difficult doing work/chores  Somewhat difficult Not difficult at all      11/22/2023   10:27 AM 08/23/2023    1:26 PM 12/02/2022   11:30 AM 08/19/2021    2:41 PM  GAD 7 : Generalized Anxiety Score   Nervous, Anxious, on Edge 1 1 1  0  Control/stop worrying 1 1 1 1   Worry too much - different things 1 2 1 1   Trouble relaxing 1 1 1 1   Restless 0 1 0 1  Easily annoyed or irritable 1 1 0 1  Afraid - awful might happen 1 1 1  0  Total GAD 7 Score 6 8 5  5  Anxiety Difficulty  Somewhat difficult Not difficult at all Not difficult at all   Assessment/ Plan: 58 y.o. female   Weight loss counseling, encounter for - Plan: tirzepatide (ZEPBOUND) 2.5 MG/0.5ML Pen, tirzepatide (ZEPBOUND) 5 MG/0.5ML Pen, tirzepatide (ZEPBOUND) 7.5 MG/0.5ML Pen  Morbid obesity (HCC) - Plan: tirzepatide (ZEPBOUND) 2.5 MG/0.5ML Pen, tirzepatide (ZEPBOUND) 5 MG/0.5ML Pen, tirzepatide (ZEPBOUND) 7.5 MG/0.5ML Pen  BMI 35.0-35.9,adult - Plan: tirzepatide (ZEPBOUND) 2.5 MG/0.5ML Pen, tirzepatide (ZEPBOUND) 5 MG/0.5ML Pen, tirzepatide (ZEPBOUND) 7.5 MG/0.5ML Pen  Pre-diabetes - Plan: tirzepatide (ZEPBOUND) 2.5 MG/0.5ML Pen, tirzepatide (ZEPBOUND) 5 MG/0.5ML Pen, tirzepatide (ZEPBOUND) 7.5 MG/0.5ML Pen  Mixed hyperlipidemia - Plan: tirzepatide (ZEPBOUND) 2.5 MG/0.5ML Pen, tirzepatide (ZEPBOUND) 5 MG/0.5ML Pen, tirzepatide (ZEPBOUND) 7.5 MG/0.5ML Pen  Rosacea - Plan: metroNIDAZOLE  (METROGEL ) 1 % gel  Discussed the various options including GIP, GLP, Qsymia and Vyvanse.  I do not think that she is quite the Vyvanse patient.  I think the other 3 are options for her but she is going to inquire with insurance as to whether any of these are covered.  Her BMI sadly has gone over 35 now, putting her in the morbidly obese category given comorbidities of prediabetes, hyperlipidemia and depression.  We discussed possible side effects of all medications and risk versus benefits.  She will contact me via MyChart to let me know which medication is approved and I will gladly start her on 1 of these  I have put her on MetroGel  facial for rosacea.  I will inquire with our pharmacist regarding ivermectin as I have not heard of this as an  upcoming treatment for rosacea  Total time spent with patient 26 min.  Greater than 50% of encounter spent in coordination of care/counseling.   **addendum: Patient called back to let me know that her insurance will cover Wegovy, Zepbound and Qsymia.  Prefers Zepbound   Gavynn Duvall Bambi Bonine, DO [facility] [phone]

## 2023-11-30 ENCOUNTER — Encounter: Payer: Self-pay | Admitting: Family Medicine

## 2023-11-30 DIAGNOSIS — Z6835 Body mass index (BMI) 35.0-35.9, adult: Secondary | ICD-10-CM

## 2023-11-30 DIAGNOSIS — Z713 Dietary counseling and surveillance: Secondary | ICD-10-CM

## 2023-11-30 DIAGNOSIS — R7303 Prediabetes: Secondary | ICD-10-CM

## 2023-11-30 DIAGNOSIS — E782 Mixed hyperlipidemia: Secondary | ICD-10-CM

## 2023-12-01 ENCOUNTER — Other Ambulatory Visit (HOSPITAL_COMMUNITY): Payer: Self-pay

## 2023-12-01 ENCOUNTER — Telehealth: Payer: Self-pay

## 2023-12-01 NOTE — Telephone Encounter (Signed)
Sorry forgot to route...

## 2023-12-01 NOTE — Telephone Encounter (Signed)
 Pharmacy Patient Advocate Encounter   Received notification from Pt Calls Messages that prior authorization for Zepbound  is required/requested.   Insurance verification completed.   The patient is insured through [facility] .   Per test claim:  Patient has a plan benefit exclusion and must be ordered through [facility] [phone]

## 2023-12-03 ENCOUNTER — Telehealth: Payer: Self-pay | Admitting: Pharmacy Technician

## 2023-12-03 ENCOUNTER — Other Ambulatory Visit (HOSPITAL_COMMUNITY): Payer: Self-pay

## 2023-12-03 NOTE — Telephone Encounter (Signed)
 Pharmacy Patient Advocate Encounter   Received notification from Patient Advice Request messages that prior authorization for Zepbound  2.5MG /0.5ML pen-injectors is required/requested.   Insurance verification completed.   The patient is insured through [facility] .   Per test claim: PA required; PA submitted to above mentioned insurance via CoverMyMeds Key/confirmation #/EOC [id number] Status is pending  If approved patient will have to fill at a [facility].

## 2023-12-03 NOTE — Telephone Encounter (Signed)
 PA request has been Started. New Encounter has been or will be created for follow up. For additional info see Pharmacy Prior Auth telephone encounter from 12/03/2023.

## 2023-12-06 ENCOUNTER — Other Ambulatory Visit (HOSPITAL_COMMUNITY): Payer: Self-pay

## 2023-12-08 ENCOUNTER — Other Ambulatory Visit: Payer: Self-pay | Admitting: *Deleted

## 2023-12-08 DIAGNOSIS — Z7189 Other specified counseling: Secondary | ICD-10-CM

## 2023-12-08 DIAGNOSIS — N951 Menopausal and female climacteric states: Secondary | ICD-10-CM

## 2023-12-08 MED ORDER — MEDROXYPROGESTERONE ACETATE 2.5 MG PO TABS: 2.5000 mg | ORAL_TABLET | Freq: Every day | ORAL | 1 refills | Status: DC

## 2023-12-08 NOTE — Telephone Encounter (Signed)
 Pharmacy Patient Advocate Encounter  Received notification from [facility] that Prior Authorization for Zepbound  has been APPROVED from 12/06/2023 to 06/26/2024   PA #/Case ID/Reference #: [id number]

## 2023-12-08 NOTE — Telephone Encounter (Signed)
 PA has been approved and documented in separate encounter. Thanks

## 2023-12-08 NOTE — Telephone Encounter (Signed)
 Medication refill request: medroxyprogesterone  2.5 mg  Last OV:  09/07/23 BS ultrasound consult Next AEX: 08/02/24 Last MMG (if hormonal medication request): 11/03/23 density B/BIRADS 1 negative  Refill authorized: Please advise.   Patient's annual is scheduled for 07/2024. Medication pended for #90, 1RF. Please refill if appropriate.

## 2023-12-08 NOTE — Telephone Encounter (Signed)
-----   Message from [place] S sent at 12/08/2023 12:00 PM EDT ----- Regarding: reill Pt needs medroxyprogesterone  2.5 mg

## 2023-12-08 NOTE — Telephone Encounter (Signed)
 Pt aware via mychart

## 2023-12-13 ENCOUNTER — Other Ambulatory Visit: Payer: Self-pay | Admitting: Obstetrics and Gynecology

## 2023-12-13 DIAGNOSIS — Z79890 Hormone replacement therapy (postmenopausal): Secondary | ICD-10-CM

## 2023-12-13 MED ORDER — ZEPBOUND 5 MG/0.5ML ~~LOC~~ SOAJ: 5.0000 mg | SUBCUTANEOUS | 0 refills | Status: DC

## 2023-12-13 MED ORDER — ZEPBOUND 2.5 MG/0.5ML ~~LOC~~ SOAJ: 2.5000 mg | SUBCUTANEOUS | 0 refills | Status: DC

## 2023-12-13 MED ORDER — ZEPBOUND 7.5 MG/0.5ML ~~LOC~~ SOAJ: 7.5000 mg | SUBCUTANEOUS | 1 refills | Status: DC

## 2023-12-13 NOTE — Telephone Encounter (Signed)
 Medication refill request: estradiol  mg gel Last AEX:  09-24-22 Next AEX: 08-02-24 Last MMG (if hormonal medication request): 10-29-23 birads 1:neg Refill authorized: please approve or deny as appropriate

## 2024-01-06 ENCOUNTER — Other Ambulatory Visit: Payer: Self-pay | Admitting: Family Medicine

## 2024-01-11 ENCOUNTER — Other Ambulatory Visit: Payer: Self-pay

## 2024-01-11 DIAGNOSIS — B009 Herpesviral infection, unspecified: Secondary | ICD-10-CM

## 2024-01-11 MED ORDER — VALACYCLOVIR HCL 500 MG PO TABS: ORAL_TABLET | ORAL | 0 refills | Status: DC

## 2024-01-11 NOTE — Telephone Encounter (Signed)
 Med refill request: valacyclovir  500 mg Last OV: 09/07/23 Dr. Colvin Dec EMB Last AEX: 09/24/22 Dr. Jertson Next AEX: 01/18/24 Last MMG (if hormonal med) 10/29/23 BI-RADS 1 negative Refill authorized: valacyclovir  500 mg Please approve or deny as appropriate.

## 2024-01-17 NOTE — Progress Notes (Unsigned)
 58 y.o. G76P1001 Married Caucasian female here for annual exam.    PCP: Jolinda Norene HERO, DO   Patient's last menstrual period was 04/16/2020.           Sexually active: Yes.    The current method of family planning is post menopausal status.    Menopausal hormone therapy:  Estradiol , provera  Exercising: {yes no:314532}  {types:19826} Smoker:  no  OB History  Gravida Para Term Preterm AB Living  1 1 1   1   SAB IAB Ectopic Multiple Live Births      1    # Outcome Date GA Lbr Len/2nd Weight Sex Type Anes PTL Lv  1 Term      Vag-Spont   LIV     HEALTH MAINTENANCE: Last 2 paps:  04/09/21 neg HR HPV neg History of abnormal Pap or positive HPV:  no Mammogram:   10/29/23 Breast Density Cat B, BIRADS Cat 1 neg  Colonoscopy:  05/07/17  Bone Density:  n/a  Result  n/a   Immunization History  Administered Date(s) Administered   Hep A / Hep B 01/20/2012, 02/12/2012   Hepatitis A 02/17/2013   Hepatitis A, Ped/Adol-2 Dose 02/17/2013   Influenza Split 04/27/2014, 04/26/2016, 04/29/2017   Influenza, Seasonal, Injecte, Preservative Fre 05/03/2023   Influenza-Unspecified 04/27/2014, 04/26/2016, 04/29/2017, 05/11/2019   PFIZER Comirnaty(Gray Top)Covid-19 Tri-Sucrose Vaccine 08/16/2019, 09/04/2019   PFIZER(Purple Top)SARS-COV-2 Vaccination 08/16/2019, 09/04/2019   Td (Adult),5 Lf Tetanus Toxid, Preservative Free 07/28/2004   Tdap 01/30/2016, 12/20/2017   Zoster Recombinant(Shingrix) 11/16/2016, 03/03/2017      reports that she has never smoked. She has never used smokeless tobacco. She reports current alcohol use. She reports current drug use.  Past Medical History:  Diagnosis Date   Chest discomfort 10/13/2019   Depression    Generalized anxiety disorder    GERD (gastroesophageal reflux disease)    History of palpitations    MARCH 2021   Hyperlipidemia 04/16/2016   Hypothyroidism    Inappropriate sinus tachycardia (HCC) 05/10/2020   Palpitations 10/13/2019   PMB  (postmenopausal bleeding)    Preoperative cardiovascular examination 05/10/2020   Suicidal behavior with attempted self-injury [facility])    Urinary incontinence     Past Surgical History:  Procedure Laterality Date   chin and lip surgery  2001   COLONSCOPY  2018   5 POLYPS REMOVED   DILATATION & CURETTAGE/HYSTEROSCOPY WITH MYOSURE N/A 06/18/2020   Procedure: DILATATION & CURETTAGE/HYSTEROSCOPY WITH MYOSURE;  Surgeon: Jannis Kate Norris, MD;  Location: [facility];  Service: Gynecology;  Laterality: N/A;   labial repair  2000    Current Outpatient Medications  Medication Sig Dispense Refill   atorvastatin  (LIPITOR) 40 MG tablet Take 1 tablet (40 mg total) by mouth daily. 90 tablet 3   busPIRone  (BUSPAR ) 5 MG tablet TAKE 1 TABLET (5 MG TOTAL) BY MOUTH 2 (TWO) TIMES DAILY. FOR ANXIETY 180 tablet 0   desloratadine  (CLARINEX ) 5 MG tablet Take 1 tablet (5 mg total) by mouth daily. 90 tablet 3   DULoxetine  (CYMBALTA ) 30 MG capsule Take 1 capsule (30 mg total) by mouth daily. Take with 60mg  to equal 90mg  daily. 90 capsule 3   DULoxetine  (CYMBALTA ) 60 MG capsule Take 1 capsule (60 mg total) by mouth daily. 90 capsule 3   Estradiol  0.5 MG/0.5GM GEL PLACE 1 PACKET ONTO THE SKIN DAILY. APPLY TO LOWER ABDOMEN OR UPPER THIGH. 28 each 2   levothyroxine  (SYNTHROID ) 25 MCG tablet TAKE 1.5 TABLETS (37.5 MCG TOTAL) BY  MOUTH DAILY BEFORE BREAKFAST. 135 tablet 3   medroxyPROGESTERone  (PROVERA ) 2.5 MG tablet Take 1 tablet (2.5 mg total) by mouth daily. 90 tablet 1   metFORMIN  (GLUCOPHAGE  XR) 500 MG 24 hr tablet Take 1 tablet (500 mg total) by mouth daily with breakfast. 90 tablet 3   metoprolol  succinate (TOPROL -XL) 25 MG 24 hr tablet Take 1 tablet (25 mg total) by mouth daily. 90 tablet 3   metroNIDAZOLE  (METROGEL ) 1 % gel Apply topically daily. 45 g 3   omeprazole  (PRILOSEC) 20 MG capsule TAKE 1 CAPSULE BY MOUTH EVERY DAY 90 capsule 3   tirzepatide  (ZEPBOUND ) 2.5 MG/0.5ML Pen Inject 2.5 mg  into the skin once a week. Month #1 2 mL 0   tirzepatide  (ZEPBOUND ) 5 MG/0.5ML Pen Inject 5 mg into the skin once a week. Month #2 2 mL 0   tirzepatide  (ZEPBOUND ) 7.5 MG/0.5ML Pen Inject 7.5 mg into the skin once a week. Month #3 2 mL 1   tretinoin  (RETIN-A ) 0.025 % cream Apply topically at bedtime. 45 g 3   triamcinolone  (NASACORT ) 55 MCG/ACT AERO nasal inhaler Place 2 sprays into the nose daily. 1 each 12   valACYclovir  (VALTREX ) 500 MG tablet TAKE ONE TABLET BY MOUTH ONCE A DAY, INCREASE TO ONE TABLET TWO TIMES A DAY FOR 3 DAYS WITH AN OUTBREAK 30 tablet 0   No current facility-administered medications for this visit.    ALLERGIES: Sulfa antibiotics, Wellbutrin  [bupropion ], and Pantoprazole   Family History  Problem Relation Age of Onset   Heart attack Mother    Esophageal cancer Mother    Heart attack Father    Hyperlipidemia Sister    Thyroid  disease Sister    Diabetes Mellitus II Sister    Diabetes Mellitus II Sister    Hyperlipidemia Sister    Diabetes Mellitus II Sister    Hyperlipidemia Sister    Hyperlipidemia Sister     Review of Systems  PHYSICAL EXAM:  LMP 04/16/2020     General appearance: alert, cooperative and appears stated age Head: normocephalic, without obvious abnormality, atraumatic Neck: no adenopathy, supple, symmetrical, trachea midline and thyroid  normal to inspection and palpation Lungs: clear to auscultation bilaterally Breasts: normal appearance, no masses or tenderness, No nipple retraction or dimpling, No nipple discharge or bleeding, No axillary adenopathy Heart: regular rate and rhythm Abdomen: soft, non-tender; no masses, no organomegaly Extremities: extremities normal, atraumatic, no cyanosis or edema Skin: skin color, texture, turgor normal. No rashes or lesions Lymph nodes: cervical, supraclavicular, and axillary nodes normal. Neurologic: grossly normal  Pelvic: External genitalia:  no lesions              No abnormal inguinal nodes  palpated.              Urethra:  normal appearing urethra with no masses, tenderness or lesions              Bartholins and Skenes: normal                 Vagina: normal appearing vagina with normal color and discharge, no lesions              Cervix: no lesions              Pap taken: {yes no:314532} Bimanual Exam:  Uterus:  normal size, contour, position, consistency, mobility, non-tender              Adnexa: no mass, fullness, tenderness  Rectal exam: {yes no:314532}.  Confirms.              Anus:  normal sphincter tone, no lesions  Chaperone was present for exam:  {BSCHAPERONE:31226::Emily F, CMA}  ASSESSMENT: Well woman visit with gynecologic exam.  PHQ-2-9: ***  ***  PLAN: Mammogram screening discussed. Self breast awareness reviewed. Pap and HRV collected:  {yes no:314532} Guidelines for Calcium , Vitamin D , regular exercise program including cardiovascular and weight bearing exercise. Medication refills:  *** {LABS (Optional):23779} Follow up:  ***    Additional counseling given.  {yes [id number]. ***  total time was spent for this patient encounter, including preparation, face-to-face counseling with the patient, coordination of care, and documentation of the encounter in addition to doing the well woman visit with gynecologic exam.

## 2024-01-18 ENCOUNTER — Encounter: Payer: Self-pay | Admitting: Obstetrics and Gynecology

## 2024-01-18 ENCOUNTER — Encounter: Payer: Self-pay | Admitting: Family Medicine

## 2024-01-18 ENCOUNTER — Other Ambulatory Visit (HOSPITAL_COMMUNITY)
Admission: RE | Admit: 2024-01-18 | Discharge: 2024-01-18 | Disposition: A | Discharge status: Home / Self Care | Source: Ambulatory Visit | Attending: Obstetrics and Gynecology | Admitting: Obstetrics and Gynecology

## 2024-01-18 ENCOUNTER — Ambulatory Visit (INDEPENDENT_AMBULATORY_CARE_PROVIDER_SITE_OTHER): Admitting: Obstetrics and Gynecology

## 2024-01-18 VITALS — BP 124/76 | HR 88 | Ht 65.0 in | Wt 165.0 lb

## 2024-01-18 DIAGNOSIS — Z79890 Hormone replacement therapy (postmenopausal): Secondary | ICD-10-CM | POA: Diagnosis not present

## 2024-01-18 DIAGNOSIS — B009 Herpesviral infection, unspecified: Secondary | ICD-10-CM

## 2024-01-18 DIAGNOSIS — Z6835 Body mass index (BMI) 35.0-35.9, adult: Secondary | ICD-10-CM

## 2024-01-18 DIAGNOSIS — Z8742 Personal history of other diseases of the female genital tract: Secondary | ICD-10-CM

## 2024-01-18 DIAGNOSIS — Z1331 Encounter for screening for depression: Secondary | ICD-10-CM

## 2024-01-18 DIAGNOSIS — Z124 Encounter for screening for malignant neoplasm of cervix: Secondary | ICD-10-CM

## 2024-01-18 DIAGNOSIS — Z713 Dietary counseling and surveillance: Secondary | ICD-10-CM

## 2024-01-18 DIAGNOSIS — N951 Menopausal and female climacteric states: Secondary | ICD-10-CM | POA: Diagnosis not present

## 2024-01-18 DIAGNOSIS — Z7189 Other specified counseling: Secondary | ICD-10-CM | POA: Diagnosis not present

## 2024-01-18 DIAGNOSIS — E782 Mixed hyperlipidemia: Secondary | ICD-10-CM

## 2024-01-18 DIAGNOSIS — Z01419 Encounter for gynecological examination (general) (routine) without abnormal findings: Secondary | ICD-10-CM | POA: Diagnosis not present

## 2024-01-18 DIAGNOSIS — R7303 Prediabetes: Secondary | ICD-10-CM

## 2024-01-18 MED ORDER — MEDROXYPROGESTERONE ACETATE 2.5 MG PO TABS
2.5000 mg | ORAL_TABLET | Freq: Every day | ORAL | 11 refills | Status: AC
Start: 2024-01-18 — End: ?

## 2024-01-18 MED ORDER — ESTRADIOL 0.5 MG/0.5GM TD GEL
TRANSDERMAL | 11 refills | Status: DC
Start: 2024-01-18 — End: 2024-04-11

## 2024-01-18 MED ORDER — VALACYCLOVIR HCL 500 MG PO TABS: ORAL_TABLET | ORAL | 3 refills | Status: DC

## 2024-01-18 NOTE — Patient Instructions (Signed)

## 2024-01-19 LAB — CYTOLOGY - PAP
Comment: NEGATIVE
Diagnosis: NEGATIVE
High risk HPV: NEGATIVE

## 2024-01-19 MED ORDER — ZEPBOUND 5 MG/0.5ML ~~LOC~~ SOAJ: 5.0000 mg | SUBCUTANEOUS | 0 refills | Status: AC

## 2024-01-19 MED ORDER — ZEPBOUND 7.5 MG/0.5ML ~~LOC~~ SOAJ: 7.5000 mg | SUBCUTANEOUS | 1 refills | Status: AC

## 2024-01-19 MED ORDER — ZEPBOUND 2.5 MG/0.5ML ~~LOC~~ SOAJ: 2.5000 mg | SUBCUTANEOUS | 0 refills | Status: AC

## 2024-01-20 ENCOUNTER — Ambulatory Visit: Payer: Self-pay | Admitting: Obstetrics and Gynecology

## 2024-02-04 ENCOUNTER — Other Ambulatory Visit: Payer: Self-pay | Admitting: Family Medicine

## 2024-02-04 DIAGNOSIS — F411 Generalized anxiety disorder: Secondary | ICD-10-CM

## 2024-02-04 DIAGNOSIS — F320 Major depressive disorder, single episode, mild: Secondary | ICD-10-CM

## 2024-02-25 NOTE — Progress Notes (Signed)
 Subjective:  Chelsea Gross is a 58 y.o. female here for weight loss management.   SHe is interested in weight loss injections for weight loss. Current BMI 37.83 220 lbs. SHe is not a candidate for stimulant [facility] meds d/t palpitations.   She is walking on treadmill and/or park for exercise at least per week. She is eating a calorie reduced diet. She regularly intermittent fasts. She has done weight watchers, Zumba, [employer]. Member of [employer] in the past . She is currently not on any GLP meds but does take Metformin  for Pre Diabetes. She does not have any FDA contraindications to medication such as pregnancy, lactating, personal or family history of thyroid  cancer, or pancreatitis.  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   Review of Systems Pertinent items are noted in HPI   Family History  Problem Relation Age of Onset  . Cancer Mother        lung  . Heart disease Father    Past Medical History:  Diagnosis Date  . Anxiety   . Depression   . Disease of thyroid  gland   . GERD (gastroesophageal reflux disease)   . Hyperlipidemia    History reviewed. No pertinent surgical history. Pediatric History  Patient Parents  . Not on file   Other Topics Concern  . Not on file  Social History Narrative  . Not on file    Objective:   BP 132/81 (BP Location: Left Upper Arm, Patient Position: Sitting)   Pulse 96   Temp 97.9 F (36.6 C) (Temporal)   Resp 18   Ht 5' 4 (1.626 m)   Wt 220 lb 6.4 oz (100 kg)   SpO2 100%   Breastfeeding No   BMI 37.83 kg/m  Gen: Alert, oriented, non toxic, and well hydrated.  No signs of acute distress.  Obese Head: Normocephalic.  Atraumatic.  Sclera anicteric.  Conjunctiva clear. Neck: Supple, no lymphadenopathy Respiratory:  Lungs clear to auscultation.  No use of accessory muscles. Cardiovascular: Regular rate and rhythm.  No murmurs noted Abdominal:  Soft, non tender, non distended.  No hepatosplenomegally Neuro: Cranial nerves  intact grossly.  No loss of strength, sensation Extremities:  Full range of motion.  No cyanosis, clubbing, or edema. Skin:  No rashes noted.  No bleeding or bruising noted. Psych: Oriented, alert.  Normal mood and affect.  Assessment:     ICD-10-CM   1. Morbid obesity (*)  E66.01 tirzepatide  (ZEPBOUND ) 2.5 mg/0.5 mL injection      Plan:  No orders of the defined types were placed in this encounter.   - Encouraged healthy lifestyle modifications with healthy diet and adequate exercise. - Take medications as prescribed.  Reviewed side effects.  Reviewed signs and symptoms that would prompt closer evaluation in clinic or emergency dept. - Follow up for repeat assessment in 3months  - If symptoms worsen or persist, advised pt to contact office for re-evaluation - I discussed this diagnosis with the patient and discussed the treatment plan with them.  This treatment plan is also outlined in the Patient Instructions and a copy of this was provided to the patient.

## 2024-03-13 ENCOUNTER — Ambulatory Visit (HOSPITAL_COMMUNITY): Admission: EM | Admit: 2024-03-13 | Discharge: 2024-03-13 | Disposition: A | Discharge status: Home / Self Care

## 2024-03-13 ENCOUNTER — Encounter (INDEPENDENT_AMBULATORY_CARE_PROVIDER_SITE_OTHER): Payer: Self-pay | Admitting: Family Medicine

## 2024-03-13 DIAGNOSIS — Z630 Problems in relationship with spouse or partner: Secondary | ICD-10-CM

## 2024-03-13 DIAGNOSIS — F321 Major depressive disorder, single episode, moderate: Secondary | ICD-10-CM

## 2024-03-13 DIAGNOSIS — Z638 Other specified problems related to primary support group: Secondary | ICD-10-CM

## 2024-03-13 DIAGNOSIS — F339 Major depressive disorder, recurrent, unspecified: Secondary | ICD-10-CM | POA: Diagnosis not present

## 2024-03-13 MED ORDER — CARIPRAZINE HCL 1.5 MG PO CAPS: 1.5000 mg | ORAL_CAPSULE | Freq: Every day | ORAL | 0 refills | Status: AC
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
## 2024-03-13 NOTE — Discharge Instructions (Signed)
 Follow up with resources provided
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
## 2024-03-13 NOTE — Telephone Encounter (Signed)

## 2024-04-05 ENCOUNTER — Other Ambulatory Visit: Payer: Self-pay | Admitting: Obstetrics and Gynecology

## 2024-04-05 DIAGNOSIS — B009 Herpesviral infection, unspecified: Secondary | ICD-10-CM

## 2024-04-06 NOTE — Telephone Encounter (Signed)
 Med refill request: valacyclovir  (valtrex ) 500 mg tablet Last ordered: 01/18/24 Dispense: #30 tablets with 3 refills  Last AEX:  01/18/24 Next AEX: not yet scheduled  Refill authorized? Please Advise.

## 2024-04-07 ENCOUNTER — Telehealth: Payer: Self-pay

## 2024-04-07 NOTE — Telephone Encounter (Signed)
 Message sent to patient per Dr.Silva regarding Valtrex  prescription. Dr. Nikki wanted to know if medication needed to be changed to everyday or keep as PRN. Patient stated change to everyday prescription.  Dr.Silva has been made aware.

## 2024-04-07 NOTE — Telephone Encounter (Signed)
 Patient is requesting medication be changed to everyday prescription.

## 2024-04-07 NOTE — Telephone Encounter (Signed)
 Reached out to patient to see if she needs medication changed to everyday prescription or keep as needed. Waiting for response.

## 2024-04-10 ENCOUNTER — Encounter: Payer: Self-pay | Admitting: Obstetrics and Gynecology

## 2024-04-11 ENCOUNTER — Other Ambulatory Visit: Payer: Self-pay | Admitting: Obstetrics and Gynecology

## 2024-04-11 MED ORDER — ESTRADIOL 0.1 MG/GM VA CREA: TOPICAL_CREAM | VAGINAL | 2 refills | Status: AC

## 2024-04-11 MED ORDER — ESTRADIOL 0.75 MG/0.75GM TD GEL: 0.7500 mg | Freq: Every day | TRANSDERMAL | 8 refills | Status: AC

## 2024-04-17 ENCOUNTER — Ambulatory Visit: Payer: Self-pay
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
## 2024-04-17 NOTE — Telephone Encounter (Signed)
 Appt made.

## 2024-04-18 ENCOUNTER — Ambulatory Visit: Admitting: Family Medicine

## 2024-05-04 ENCOUNTER — Ambulatory Visit
Admission: RE | Admit: 2024-05-04 | Discharge: 2024-05-04 | Disposition: A | Discharge status: Home / Self Care | Attending: Nurse Practitioner | Admitting: Nurse Practitioner

## 2024-05-04 VITALS — BP 124/84 | HR 109 | Temp 97.8°F | Resp 20

## 2024-05-04 DIAGNOSIS — J069 Acute upper respiratory infection, unspecified: Secondary | ICD-10-CM

## 2024-05-04 LAB — POC SOFIA SARS ANTIGEN FIA: SARS Coronavirus 2 Ag: NEGATIVE

## 2024-05-04 NOTE — Discharge Instructions (Addendum)
 You have a viral upper respiratory infection.  Symptoms should improve over the next week to 10 days.  If you develop chest pain or shortness of breath, go to the emergency room.  COVID-19 and influenza testing is negative today.    Some things that can make you feel better are: - Increased rest - Increasing fluid with water/sugar free electrolytes - Acetaminophen  and ibuprofen as needed for fever/pain - Salt water gargling, chloraseptic spray and throat lozenges - OTC guaifenesin  (Mucinex ) 600 mg twice daily for congestion - Saline sinus flushes or a neti pot - Humidifying the air

## 2024-05-04 NOTE — ED Provider Notes (Signed)
 [facility]    CSN: [id number] Arrival date & time: 05/04/24  1432      History   Chief Complaint Chief Complaint  Patient presents with   Fever    Cold , fever, need doctors note for work - Entered by patient    HPI Chelsea Gross is a 58 y.o. female.   Patient presents today with 1 day history of tactile fever, nasal congestion, sore throat, ear stuffiness, decreased appetite, and fatigue.  No cough, shortness of breath, chest pain, post nasal drainage, headache, abdominal pain, nausea/vomiting, diarrhea.  She works at a Insurance claims handler clinic, no known sick contacts.  Has taken Dayquil today with some benefit.    Past Medical History:  Diagnosis Date   Chest discomfort 10/13/2019   Depression    Generalized anxiety disorder    GERD (gastroesophageal reflux disease)    History of palpitations    MARCH 2021   Hyperlipidemia 04/16/2016   Hypothyroidism    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   Palpitations 10/13/2019   PMB (postmenopausal bleeding)    Preoperative cardiovascular examination 05/10/2020   Suicidal behavior with attempted self-injury [facility])    Urinary incontinence     Patient Active Problem List   Diagnosis Date Noted   BMI 35.0-35.9,adult 11/22/2023   Acute metabolic encephalopathy 12/17/2022   Intentional benzodiazepine overdose (HCC) 12/17/2022   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   Benzodiazepine overdose, intentional self-harm, initial encounter (HCC) 12/17/2022   Overdose 12/16/2022   Morbid obesity (HCC) 09/24/2022   Guaiac positive stools 09/24/2022   Prediabetes 09/18/2021   Actinic keratosis 04/09/2021   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   Preoperative cardiovascular examination 05/10/2020   Anxiety    GERD (gastroesophageal reflux disease)    History of palpitations    Hypothyroid    PMB (postmenopausal bleeding)    Thyroid  disease    Urinary incontinence    Palpitations 10/13/2019   Chest discomfort  10/13/2019   Generalized anxiety disorder 09/21/2018   Controlled substance agreement signed 09/21/2018   Depression 04/16/2016   Hyperlipidemia 04/16/2016   Hypothyroidism 04/16/2016    Past Surgical History:  Procedure Laterality Date   chin and lip surgery  2001   COLONSCOPY  2018   5 POLYPS REMOVED   DILATATION & CURETTAGE/HYSTEROSCOPY WITH MYOSURE N/A 06/18/2020   Procedure: DILATATION & CURETTAGE/HYSTEROSCOPY WITH MYOSURE;  Surgeon: Jannis Kate Norris, MD;  Location: [facility];  Service: Gynecology;  Laterality: N/A;   labial repair  2000    OB History     Gravida  1   Para  1   Term  1   Preterm      AB      Living  1      SAB      IAB      Ectopic      Multiple      Live Births  1            Home Medications    Prior to Admission medications   Medication Sig Start Date End Date Taking? Authorizing Provider  atorvastatin  (LIPITOR) 40 MG tablet Take 1 tablet (40 mg total) by mouth daily. 08/23/23   Jolinda Norene HERO, DO  busPIRone  (BUSPAR ) 5 MG tablet TAKE 1 TABLET (5 MG TOTAL) BY MOUTH 2 (TWO) TIMES DAILY. FOR ANXIETY 02/04/24   Jolinda Norene M, DO  cariprazine  (VRAYLAR ) 1.5 MG capsule Take 1 capsule (1.5 mg total) by mouth daily. 03/13/24   Jolinda Norene HERO,  DO  desloratadine  (CLARINEX ) 5 MG tablet Take 1 tablet (5 mg total) by mouth daily. 08/23/23   Jolinda Norene HERO, DO  DULoxetine  (CYMBALTA ) 30 MG capsule Take 1 capsule (30 mg total) by mouth daily. Take with 60mg  to equal 90mg  daily. 10/18/23   Jolinda Norene HERO, DO  DULoxetine  (CYMBALTA ) 60 MG capsule Take 1 capsule (60 mg total) by mouth daily. 08/23/23   Jolinda Norene HERO, DO  Estradiol  (DIVIGEL ) 0.75 MG/0.75GM GEL Place 0.75 mg onto the skin daily. 04/11/24   Cathlyn JAYSON Nikki Bobie FORBES, MD  estradiol  (ESTRACE ) 0.1 MG/GM vaginal cream Use 1/2 g vaginally every night for the first 2 weeks, then use 1/2 g vaginally two or three times per week as needed to maintain  symptom relief. 04/11/24   Cathlyn JAYSON Nikki Bobie FORBES, MD  levothyroxine  (SYNTHROID ) 25 MCG tablet TAKE 1.5 TABLETS (37.5 MCG TOTAL) BY MOUTH DAILY BEFORE BREAKFAST. 09/16/23   Zollie Lowers, MD  medroxyPROGESTERone  (PROVERA ) 2.5 MG tablet Take 1 tablet (2.5 mg total) by mouth daily. 01/18/24   Cathlyn JAYSON Nikki Bobie FORBES, MD  metFORMIN  (GLUCOPHAGE  XR) 500 MG 24 hr tablet Take 1 tablet (500 mg total) by mouth daily with breakfast. 08/23/23   Jolinda Norene M, DO  metoprolol  succinate (TOPROL -XL) 25 MG 24 hr tablet Take 1 tablet (25 mg total) by mouth daily. 08/23/23   Jolinda Norene HERO, DO  metroNIDAZOLE  (METROGEL ) 1 % gel Apply topically daily. 11/22/23   Jolinda Norene M, DO  omeprazole  (PRILOSEC) 20 MG capsule TAKE 1 CAPSULE BY MOUTH EVERY DAY 01/07/24   Jolinda Norene M, DO  tirzepatide  (ZEPBOUND ) 2.5 MG/0.5ML Pen Inject 2.5 mg into the skin once a week. Month #1 01/19/24   Jolinda Norene M, DO  tirzepatide  (ZEPBOUND ) 5 MG/0.5ML Pen Inject 5 mg into the skin once a week. Month #2 01/19/24   Jolinda Norene M, DO  tirzepatide  (ZEPBOUND ) 7.5 MG/0.5ML Pen Inject 7.5 mg into the skin once a week. Month #3 01/19/24   Jolinda Norene HERO, DO  tretinoin  (RETIN-A ) 0.025 % cream Apply topically at bedtime. 08/23/23   Jolinda Norene HERO, DO  triamcinolone  (NASACORT ) 55 MCG/ACT AERO nasal inhaler Place 2 sprays into the nose daily. 08/23/23   Jolinda Norene HERO, DO  valACYclovir  (VALTREX ) 500 MG tablet TAKE ONE TABLET (500 MG) BY MOUTH DAILY FOR PREVENTION.  TAKE 4 TABLETS (2000 MG) BY MOUTH TWICE DAILY FOR 24 HOURS FOR AN ORAL OUTBREAK AND TAKE ONE TABLET TWO TIMES A DAY FOR 3 DAYS WITH A GENITAL OUTBREAK 04/08/24   Cathlyn JAYSON Nikki Bobie FORBES, MD    Family History Family History  Problem Relation Age of Onset   Heart attack Mother    Esophageal cancer Mother    Heart attack Father    Hyperlipidemia Sister    Thyroid  disease Sister    Diabetes Mellitus II Sister    Diabetes Mellitus II Sister     Hyperlipidemia Sister    Diabetes Mellitus II Sister    Hyperlipidemia Sister    Hyperlipidemia Sister     Social History Social History   Tobacco Use   Smoking status: Never   Smokeless tobacco: Never  Vaping Use   Vaping status: Never Used  Substance Use Topics   Alcohol use: Yes    Alcohol/week: 0.0 standard drinks of alcohol    Comment: RARE   Drug use: Not Currently    Comment: cannabis pills or gummies rarely LAST USED  2020 PER PT  Allergies   Sulfa antibiotics, Wellbutrin  [bupropion ], and Pantoprazole    Review of Systems Review of Systems Per HPI  Physical Exam Triage Vital Signs ED Triage Vitals  Encounter Vitals Group     BP 05/04/24 1436 124/84     Girls Systolic BP Percentile --      Girls Diastolic BP Percentile --      Boys Systolic BP Percentile --      Boys Diastolic BP Percentile --      Pulse Rate 05/04/24 1436 (!) 109     Resp 05/04/24 1436 20     Temp 05/04/24 1436 97.8 F (36.6 C)     Temp Source 05/04/24 1436 Oral     SpO2 05/04/24 1436 95 %     Weight --      Height --      Head Circumference --      Peak Flow --      Pain Score 05/04/24 1439 0     Pain Loc --      Pain Education --      Exclude from Growth Chart --    No data found.  Updated Vital Signs BP 124/84 (BP Location: Right Arm)   Pulse (!) 109   Temp 97.8 F (36.6 C) (Oral)   Resp 20   LMP 04/16/2020   SpO2 95%   Visual Acuity Right Eye Distance:   Left Eye Distance:   Bilateral Distance:    Right Eye Near:   Left Eye Near:    Bilateral Near:     Physical Exam Vitals and nursing note reviewed.  Constitutional:      General: She is not in acute distress.    Appearance: Normal appearance. She is not ill-appearing or toxic-appearing.  HENT:     Head: Normocephalic and atraumatic.     Right Ear: Tympanic membrane, ear canal and external ear normal.     Left Ear: Tympanic membrane, ear canal and external ear normal.     Nose: Congestion present. No  rhinorrhea.     Mouth/Throat:     Mouth: Mucous membranes are moist.     Pharynx: Oropharynx is clear. Posterior oropharyngeal erythema and postnasal drip present. No oropharyngeal exudate.  Eyes:     General: No scleral icterus.    Extraocular Movements: Extraocular movements intact.  Cardiovascular:     Rate and Rhythm: Regular rhythm. Tachycardia present.  Pulmonary:     Effort: Pulmonary effort is normal. No respiratory distress.     Breath sounds: Normal breath sounds. No wheezing, rhonchi or rales.  Musculoskeletal:     Cervical back: Normal range of motion and neck supple.  Lymphadenopathy:     Cervical: No cervical adenopathy.  Skin:    General: Skin is warm and dry.     Coloration: Skin is not jaundiced or pale.     Findings: No erythema or rash.  Neurological:     Mental Status: She is alert and oriented to person, place, and time.  Psychiatric:        Behavior: Behavior is cooperative.      UC Treatments / Results  Labs (all labs ordered are listed, but only abnormal results are displayed) Labs Reviewed  POC SOFIA SARS ANTIGEN FIA - Normal    EKG   Radiology No results found.  Procedures Procedures (including critical care time)  Medications Ordered in UC Medications - No data to display  Initial Impression / Assessment and Plan / UC Course  I have  reviewed the triage vital signs and the nursing notes.  Pertinent labs & imaging results that were available during my care of the patient were reviewed by me and considered in my medical decision making (see chart for details).   Patient is well-appearing, normotensive, afebrile, is mildly tachycardic, not tachypneic, oxygenating well on room air.   1. Viral URI Suspect viral etiology COVID-19 test is negative Supportive care discussed with patient Return and ER precautions discussed Work excuse provided   The patient was given the opportunity to ask questions.  All questions answered to their  satisfaction.  The patient is in agreement to this plan.   Final Clinical Impressions(s) / UC Diagnoses   Final diagnoses:  Viral URI     Discharge Instructions      You have a viral upper respiratory infection.  Symptoms should improve over the next week to 10 days.  If you develop chest pain or shortness of breath, go to the emergency room.  COVID-19 and influenza testing is negative today.    Some things that can make you feel better are: - Increased rest - Increasing fluid with water/sugar free electrolytes - Acetaminophen  and ibuprofen as needed for fever/pain - Salt water gargling, chloraseptic spray and throat lozenges - OTC guaifenesin  (Mucinex ) 600 mg twice daily for congestion - Saline sinus flushes or a neti pot - Humidifying the air     ED Prescriptions   None    PDMP not reviewed this encounter.   Chandra Harlene LABOR, NP 05/04/24 1540

## 2024-05-04 NOTE — ED Triage Notes (Signed)
 Pt reports congestion, fatigued, fever x 1 day.

## 2024-06-15 ENCOUNTER — Other Ambulatory Visit (HOSPITAL_COMMUNITY): Payer: Self-pay

## 2024-07-14 ENCOUNTER — Encounter: Payer: Self-pay | Admitting: Obstetrics and Gynecology

## 2024-07-30 ENCOUNTER — Other Ambulatory Visit: Payer: Self-pay | Admitting: Family Medicine

## 2024-08-02 ENCOUNTER — Ambulatory Visit: Admitting: Obstetrics and Gynecology

## 2024-08-23 ENCOUNTER — Other Ambulatory Visit: Payer: Self-pay | Admitting: Family Medicine

## 2024-08-23 NOTE — Telephone Encounter (Signed)
 [place] pt NTBS 30-d given 07/31/24

## 2024-08-24 MED ORDER — METOPROLOL SUCCINATE ER 25 MG PO TB24: 25.0000 mg | ORAL_TABLET | Freq: Every day | ORAL | 0 refills | Status: AC

## 2024-08-24 NOTE — Telephone Encounter (Signed)
 Appt scheduled for 10/09/2024, please send refill to pharmacy

## 2024-08-24 NOTE — Addendum Note (Signed)
 Addended by: INA RAMP D on: 08/24/2024 09:51 AM   Modules accepted: Orders

## 2024-10-09 ENCOUNTER — Ambulatory Visit: Admitting: Family Medicine
